# Patient Record
Sex: Female | Born: 1995 | Race: Black or African American | Hispanic: No | Marital: Single | Smoking: Former smoker
Health system: Southern US, Community
[De-identification: ages and names within clinical notes are randomized; demographics above are authoritative.]

## PROBLEM LIST (undated history)

## (undated) ENCOUNTER — Inpatient Hospital Stay (HOSPITAL_COMMUNITY): Payer: Self-pay

## (undated) DIAGNOSIS — B951 Streptococcus, group B, as the cause of diseases classified elsewhere: Secondary | ICD-10-CM

## (undated) DIAGNOSIS — T7840XA Allergy, unspecified, initial encounter: Secondary | ICD-10-CM

## (undated) DIAGNOSIS — O2340 Unspecified infection of urinary tract in pregnancy, unspecified trimester: Secondary | ICD-10-CM

## (undated) DIAGNOSIS — L309 Dermatitis, unspecified: Secondary | ICD-10-CM

## (undated) DIAGNOSIS — B009 Herpesviral infection, unspecified: Secondary | ICD-10-CM

## (undated) DIAGNOSIS — J45909 Unspecified asthma, uncomplicated: Secondary | ICD-10-CM

## (undated) DIAGNOSIS — N730 Acute parametritis and pelvic cellulitis: Secondary | ICD-10-CM

## (undated) DIAGNOSIS — O98319 Other infections with a predominantly sexual mode of transmission complicating pregnancy, unspecified trimester: Secondary | ICD-10-CM

## (undated) DIAGNOSIS — F4310 Post-traumatic stress disorder, unspecified: Secondary | ICD-10-CM

## (undated) DIAGNOSIS — A568 Sexually transmitted chlamydial infection of other sites: Secondary | ICD-10-CM

## (undated) DIAGNOSIS — N809 Endometriosis, unspecified: Secondary | ICD-10-CM

## (undated) HISTORY — DX: Herpesviral infection, unspecified: B00.9

## (undated) HISTORY — DX: Sexually transmitted chlamydial infection of other sites: A56.8

## (undated) HISTORY — PX: FOOT SURGERY: SHX648

## (undated) HISTORY — DX: Other infections with a predominantly sexual mode of transmission complicating pregnancy, unspecified trimester: O98.319

## (undated) HISTORY — DX: Unspecified asthma, uncomplicated: J45.909

---
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
## 2013-02-08 ENCOUNTER — Telehealth (HOSPITAL_COMMUNITY): Payer: Self-pay | Admitting: Emergency Medicine

## 2013-02-08 NOTE — ED Notes (Signed)
No response to letter sent after 30 days. Chart sent to Medical Records. °

## 2013-02-20 ENCOUNTER — Inpatient Hospital Stay (HOSPITAL_COMMUNITY): Payer: Medicaid Other

## 2013-02-20 ENCOUNTER — Inpatient Hospital Stay (HOSPITAL_COMMUNITY)
Admission: AD | Admit: 2013-02-20 | Discharge: 2013-02-20 | Disposition: A | Discharge status: Home / Self Care | Payer: Medicaid Other | Source: Ambulatory Visit | Attending: Obstetrics and Gynecology | Admitting: Obstetrics and Gynecology

## 2013-02-20 ENCOUNTER — Encounter (HOSPITAL_COMMUNITY): Payer: Self-pay | Admitting: *Deleted

## 2013-02-20 DIAGNOSIS — R109 Unspecified abdominal pain: Secondary | ICD-10-CM | POA: Insufficient documentation

## 2013-02-20 DIAGNOSIS — O4410 Placenta previa with hemorrhage, unspecified trimester: Secondary | ICD-10-CM | POA: Insufficient documentation

## 2013-02-20 DIAGNOSIS — O0932 Supervision of pregnancy with insufficient antenatal care, second trimester: Secondary | ICD-10-CM

## 2013-02-20 DIAGNOSIS — B9689 Other specified bacterial agents as the cause of diseases classified elsewhere: Secondary | ICD-10-CM | POA: Insufficient documentation

## 2013-02-20 DIAGNOSIS — N760 Acute vaginitis: Secondary | ICD-10-CM | POA: Insufficient documentation

## 2013-02-20 DIAGNOSIS — O209 Hemorrhage in early pregnancy, unspecified: Secondary | ICD-10-CM | POA: Insufficient documentation

## 2013-02-20 DIAGNOSIS — O2390 Unspecified genitourinary tract infection in pregnancy, unspecified trimester: Secondary | ICD-10-CM | POA: Insufficient documentation

## 2013-02-20 DIAGNOSIS — O26899 Other specified pregnancy related conditions, unspecified trimester: Secondary | ICD-10-CM

## 2013-02-20 DIAGNOSIS — A499 Bacterial infection, unspecified: Secondary | ICD-10-CM | POA: Insufficient documentation

## 2013-02-20 LAB — WET PREP, GENITAL
Trich, Wet Prep: NONE SEEN
Yeast Wet Prep HPF POC: NONE SEEN

## 2013-02-20 LAB — URINALYSIS, ROUTINE W REFLEX MICROSCOPIC
Glucose, UA: NEGATIVE mg/dL
Hgb urine dipstick: NEGATIVE
Leukocytes, UA: NEGATIVE
Specific Gravity, Urine: 1.02 (ref 1.005–1.030)
pH: 7 (ref 5.0–8.0)

## 2013-02-20 MED ORDER — METRONIDAZOLE 500 MG PO TABS: 500.0000 mg | ORAL_TABLET | Freq: Two times a day (BID) | ORAL | Status: DC

## 2013-02-20 NOTE — MAU Note (Signed)
Having pain in my left lower quadrant since 1000. Pain is like a sharp pain and comes and goes

## 2013-02-20 NOTE — MAU Note (Addendum)
PT SAYS  SHE AWOKE  AT 10 AM-  SHE NOTICED A PAIN ON L SIDE OF ABD - THEN THROUGH  DAY - BECAME WORSE-    THEN NAP- AWOKE AT 4 PM. - FELT LIKE POKING WITH NEEDLE.  STARTED MACROBID- FOR UTI-  WAS TOLD AT [facility] - FINISHED ON 2-13.   NO BLEEDING. HAD SPOTTING  TODAY- WHEN WIPING.   NEVER BEEN HERE  FOR THIS PREG. Marland Kitchen PLANS TO GO TO CLINIC DOWNSTAIRS FOR PNC- NO  APPOINTMENT.   LAST SEX-  WAS   01-12-2013.     WENT TO [facility] ON 12-19-12-  DID LABS , UA, .  THEN WENT TO [facility] ON 01-29-2013- DID U/S AND SPEC EXAM

## 2013-02-20 NOTE — MAU Provider Note (Signed)
Chief Complaint: Abdominal Pain   First Provider Initiated Contact with Patient 02/20/13 2012     SUBJECTIVE HPI: Ruth Daniels is a 17 y.o. G1P0 at [redacted]w[redacted]d by LMP who presents to maternity admissions reporting sharp intermittent pain on left side, increasing with pt movement.  Pt points to inguinal region of left side when asked about pain.  Pt reports vaginal bleeding described as pink spotting x2 weeks, which happened this morning but denies bleeding tonight.  She completed a course of Macrobid for UTI on 2/19. She denies LOF, bright red vaginal bleeding, vaginal itching/burning, urinary symptoms, h/a, dizziness, n/v, or fever/chills.     Past Medical History  Diagnosis Date  . Allergy     Penicillin  . Eczema    Past Surgical History  Procedure Laterality Date  . No past surgeries     History   Social History  . Marital Status: Single    Spouse Name: N/A    Number of Children: N/A  . Years of Education: N/A   Occupational History  . Student     12th grade- [employer]   Social History Main Topics  . Smoking status: Former Games developer  . Smokeless tobacco: Not on file  . Alcohol Use: No  . Drug Use: No  . Sexually Active: Yes    Birth Control/ Protection: Condom   Other Topics Concern  . Not on file   Social History Narrative  . No narrative on file   No current facility-administered medications on file prior to encounter.   No current outpatient prescriptions on file prior to encounter.   Allergies  Allergen Reactions  . Penicillins Other (See Comments)    Childhood reaction unknown    ROS: Pertinent items in HPI  OBJECTIVE Blood pressure 118/61, pulse 86, resp. rate 20, height 5\' 1"  (1.549 m), weight 56.972 kg (125 lb 9.6 oz), last menstrual period 11/05/2012, SpO2 99.00%. GENERAL: Well-developed, well-nourished female in no acute distress.  HEENT: Normocephalic HEART: normal rate RESP: normal effort ABDOMEN: Soft, non-tender EXTREMITIES:  Nontender, no edema NEURO: Alert and oriented Pelvic exam: Cervix pink, visually closed, significantly friable, moderate amount bright red blood pooling while cultures collected,  large amount yellow frothy discharge, vaginal walls also friable, external genitalia normal   LAB RESULTS Results for orders placed during the hospital encounter of 02/20/13 (from the past 24 hour(s))  URINALYSIS, ROUTINE W REFLEX MICROSCOPIC     Status: None   Collection Time    02/20/13  7:35 PM      Result Value Range   Color, Urine YELLOW  YELLOW   APPearance CLEAR  CLEAR   Specific Gravity, Urine 1.020  1.005 - 1.030   pH 7.0  5.0 - 8.0   Glucose, UA NEGATIVE  NEGATIVE mg/dL   Hgb urine dipstick NEGATIVE  NEGATIVE   Bilirubin Urine NEGATIVE  NEGATIVE   Ketones, ur NEGATIVE  NEGATIVE mg/dL   Protein, ur NEGATIVE  NEGATIVE mg/dL   Urobilinogen, UA 0.2  0.0 - 1.0 mg/dL   Nitrite NEGATIVE  NEGATIVE   Leukocytes, UA NEGATIVE  NEGATIVE   Blood type:  O POS  FHT 160  IMAGING US Ob Limited  02/20/2013  *RADIOLOGY REPORT*  Clinical Data: Second trimester pregnancy.  Vaginal bleeding.  LIMITED OBSTETRIC ULTRASOUND  Number of Fetuses: 1 Heart Rate: 165 bpm Movement: Present Presentation: Breech Placental Location: Anterior Previa: Inferior margin of the placenta partially covers the internal os. Amniotic Fluid (Subjective): Normal  Vertical Pocket 6.9 cm  BPD:  2.7 cm      14 w  6 d         EDC:  08/05/2013  MATERNAL FINDINGS: Cervix: Closed Uterus/Adnexae:  Otherwise unremarkable.  IMPRESSION: 1.  Single viable IUP with fetal heart rate of 165 beats per minute and an estimated gestational age of [redacted] weeks and 6 days. 2.  At this time there appears to be a marginal placenta previa. This may change as gestation progresses, but does warrant attention on follow-up studies.  Recommend followup with non-emergent complete OB 14+ wk US examination for fetal biometric evaluation and anatomic survey if not already performed.    Original Report Authenticated By: Trudie Reed, M.D.     ASSESSMENT 1. Vaginal bleeding before [redacted] weeks gestation   2. BV (bacterial vaginosis)   3. Late prenatal care complicating pregnancy, second trimester   4. Pain of round ligament complicating pregnancy, antepartum   5. Marginal placenta previa, second trimester    **Suspicious for trichomonas based on exam but wet prep negative, treatment for BV to cover both.  PLAN Discharge home with bleeding precautions Pelvic rest Message sent to [facility] for prenatal appointment Outpatient anatomy scan ordered in 3 weeks Flagyl 500 mg BID x7 days Return to MAU as needed   Sharen Counter Certified Nurse-Midwife 02/20/2013  8:44 PM

## 2013-02-21 NOTE — MAU Provider Note (Signed)
Attestation of Attending Supervision of Advanced Practitioner: Evaluation and management procedures were performed by the PA/NP/CNM/OB Fellow under my supervision/collaboration. Chart reviewed and agree with management and plan.  Sharnelle Cappelli V 02/21/2013 6:14 AM

## 2013-02-23 LAB — GC/CHLAMYDIA PROBE AMP: CT Probe RNA: POSITIVE — AB

## 2013-03-13 ENCOUNTER — Ambulatory Visit (HOSPITAL_COMMUNITY)
Admission: RE | Admit: 2013-03-13 | Discharge: 2013-03-13 | Disposition: A | Discharge status: Home / Self Care | Payer: Medicaid Other | Source: Ambulatory Visit | Attending: Advanced Practice Midwife | Admitting: Advanced Practice Midwife

## 2013-03-13 ENCOUNTER — Ambulatory Visit (HOSPITAL_COMMUNITY): Payer: Medicaid Other

## 2013-03-13 DIAGNOSIS — Z1389 Encounter for screening for other disorder: Secondary | ICD-10-CM | POA: Insufficient documentation

## 2013-03-13 DIAGNOSIS — O0932 Supervision of pregnancy with insufficient antenatal care, second trimester: Secondary | ICD-10-CM

## 2013-03-13 DIAGNOSIS — O358XX0 Maternal care for other (suspected) fetal abnormality and damage, not applicable or unspecified: Secondary | ICD-10-CM | POA: Insufficient documentation

## 2013-03-13 DIAGNOSIS — Z363 Encounter for antenatal screening for malformations: Secondary | ICD-10-CM | POA: Insufficient documentation

## 2013-03-13 DIAGNOSIS — O0930 Supervision of pregnancy with insufficient antenatal care, unspecified trimester: Secondary | ICD-10-CM | POA: Insufficient documentation

## 2013-03-19 ENCOUNTER — Other Ambulatory Visit: Payer: Self-pay | Admitting: Family

## 2013-03-19 ENCOUNTER — Encounter: Payer: Self-pay | Admitting: Family

## 2013-03-19 ENCOUNTER — Ambulatory Visit (INDEPENDENT_AMBULATORY_CARE_PROVIDER_SITE_OTHER): Payer: Medicaid Other | Admitting: Family

## 2013-03-19 ENCOUNTER — Ambulatory Visit (HOSPITAL_COMMUNITY): Payer: Medicaid Other

## 2013-03-19 VITALS — BP 115/69 | Wt 130.0 lb

## 2013-03-19 DIAGNOSIS — A749 Chlamydial infection, unspecified: Secondary | ICD-10-CM

## 2013-03-19 DIAGNOSIS — Z3402 Encounter for supervision of normal first pregnancy, second trimester: Secondary | ICD-10-CM

## 2013-03-19 DIAGNOSIS — Z3400 Encounter for supervision of normal first pregnancy, unspecified trimester: Secondary | ICD-10-CM

## 2013-03-19 DIAGNOSIS — O4690 Antepartum hemorrhage, unspecified, unspecified trimester: Secondary | ICD-10-CM

## 2013-03-19 LAB — POCT URINALYSIS DIP (DEVICE)
Ketones, ur: NEGATIVE mg/dL
Leukocytes, UA: NEGATIVE
Protein, ur: NEGATIVE mg/dL
Specific Gravity, Urine: >=1.03 (ref 1.005–1.030)
Urobilinogen, UA: 0.2 mg/dL (ref 0.0–1.0)
pH: 6 (ref 5.0–8.0)

## 2013-03-19 LAB — HIV ANTIBODY (ROUTINE TESTING W REFLEX): HIV: NONREACTIVE

## 2013-03-19 MED ORDER — AZITHROMYCIN 250 MG PO TABS
1000.0000 mg | ORAL_TABLET | Freq: Once | ORAL | Status: AC
  Administered 2013-03-19: 1000 mg via ORAL

## 2013-03-19 NOTE — Progress Notes (Signed)
Pulse: 78

## 2013-03-19 NOTE — Progress Notes (Signed)
Nutrition note: 1st visit consult Pt has gained 20# @ [redacted]w[redacted]d, which is > expected. Pt reports eating 3 meals & 3 snacks/d. Pt is taking PNV. Pt reports heartburn occ but no N&V. NKFA. Pt received verbal & written education on general nutrition during pregnancy & a handout on Mercury in Fish. Encouraged protein with all meals & snacks. Disc wt gain goals of 25-35# or 1#/wk. Pt agrees to cont taking PNV. Pt has WIC & plans to BF. F/u if referred Blondell Reveal, MS, RD, LDN

## 2013-03-19 NOTE — Progress Notes (Signed)
New OB visit; reviewed practice information and OB visit schedule; discussed genetic screening, desires quad. Obtain prenatal labs.  +Chlamydia in MAU, not treated, receive Zithromax 1 gm today; advised partner treatment and abstain from sex two weeks after partner treatment.  Pt has history of PTSD, declined discussing why, left often for her to disclose when ready.  No report of bleeding.  Rescheduled follow-up ultrasound

## 2013-03-20 LAB — OBSTETRIC PANEL
Basophils Relative: 0 % (ref 0–1)
Eosinophils Absolute: 0 10*3/uL (ref 0.0–0.7)
Eosinophils Relative: 0 % (ref 0–5)
Lymphs Abs: 2 10*3/uL (ref 0.7–4.0)
MCH: 31.1 pg (ref 26.0–34.0)
MCHC: 34.3 g/dL (ref 30.0–36.0)
MCV: 90.5 fL (ref 78.0–100.0)
Neutrophils Relative %: 72 % (ref 43–77)
Platelets: 235 10*3/uL (ref 150–400)
RBC: 3.67 MIL/uL — ABNORMAL LOW (ref 3.87–5.11)
RDW: 14.1 % (ref 11.5–15.5)

## 2013-03-23 LAB — HEMOGLOBINOPATHY EVALUATION
Hgb A2 Quant: 2.9 % (ref 2.2–3.2)
Hgb A: 97.1 % (ref 96.8–97.8)
Hgb S Quant: 0 %

## 2013-03-30 ENCOUNTER — Encounter: Payer: Self-pay | Admitting: *Deleted

## 2013-04-01 ENCOUNTER — Encounter: Payer: Self-pay | Admitting: Family

## 2013-04-03 ENCOUNTER — Ambulatory Visit (HOSPITAL_COMMUNITY)
Admission: RE | Admit: 2013-04-03 | Discharge: 2013-04-03 | Disposition: A | Discharge status: Home / Self Care | Payer: Medicaid Other | Source: Ambulatory Visit | Attending: Family | Admitting: Family

## 2013-04-03 DIAGNOSIS — Z3689 Encounter for other specified antenatal screening: Secondary | ICD-10-CM | POA: Insufficient documentation

## 2013-04-03 DIAGNOSIS — Z3402 Encounter for supervision of normal first pregnancy, second trimester: Secondary | ICD-10-CM

## 2013-04-04 ENCOUNTER — Encounter: Payer: Self-pay | Admitting: Family

## 2013-04-07 ENCOUNTER — Encounter: Payer: Self-pay | Admitting: *Deleted

## 2013-04-07 ENCOUNTER — Telehealth: Payer: Self-pay | Admitting: *Deleted

## 2013-04-07 NOTE — Telephone Encounter (Signed)
Pt left message requesting her Rx for PNV to be switched to [facility]. Please call back at [phone]. I returned the call and left message that pt may call her pharmacy and request them to transfer any remaining refills that she still has available.

## 2013-04-16 ENCOUNTER — Ambulatory Visit (INDEPENDENT_AMBULATORY_CARE_PROVIDER_SITE_OTHER): Payer: Medicaid Other | Admitting: Obstetrics and Gynecology

## 2013-04-16 ENCOUNTER — Encounter: Payer: Self-pay | Admitting: Obstetrics and Gynecology

## 2013-04-16 ENCOUNTER — Other Ambulatory Visit: Payer: Self-pay | Admitting: Obstetrics and Gynecology

## 2013-04-16 VITALS — BP 105/71 | Temp 96.1°F | Wt 136.7 lb

## 2013-04-16 DIAGNOSIS — Z3402 Encounter for supervision of normal first pregnancy, second trimester: Secondary | ICD-10-CM

## 2013-04-16 DIAGNOSIS — Z3400 Encounter for supervision of normal first pregnancy, unspecified trimester: Secondary | ICD-10-CM

## 2013-04-16 LAB — POCT URINALYSIS DIP (DEVICE)
Glucose, UA: NEGATIVE mg/dL
Nitrite: NEGATIVE

## 2013-04-16 NOTE — Progress Notes (Signed)
Patient doing well. Denies further episodes of vaginal bleeding. She reports one episode of vaginal spotting 1 week ago for which she had to wear one pad. Ultrasound reviewed

## 2013-04-16 NOTE — Progress Notes (Signed)
Pt reports some spotting about a week ago.

## 2013-05-13 ENCOUNTER — Encounter: Payer: Self-pay | Admitting: Advanced Practice Midwife

## 2013-06-03 ENCOUNTER — Encounter: Payer: Self-pay | Admitting: Family

## 2013-06-10 ENCOUNTER — Encounter: Payer: Self-pay | Admitting: Obstetrics and Gynecology

## 2013-07-01 ENCOUNTER — Encounter: Payer: Self-pay | Admitting: Family

## 2013-08-11 ENCOUNTER — Telehealth: Payer: Self-pay | Admitting: *Deleted

## 2013-08-11 NOTE — Telephone Encounter (Signed)
Pt left message stating that her due date was 8/6 and she wants to know how long she has to wait for induction of labor.  Pt was given clinic appt tomorrow @ [phone]. Note: She has cancelled or not shown to 4 appts and has not been seen in this clinic since April.

## 2013-08-12 ENCOUNTER — Encounter: Payer: Self-pay | Admitting: Advanced Practice Midwife

## 2013-08-12 ENCOUNTER — Ambulatory Visit (INDEPENDENT_AMBULATORY_CARE_PROVIDER_SITE_OTHER): Payer: Medicaid Other | Admitting: Family Medicine

## 2013-08-12 VITALS — BP 120/72 | Temp 97.6°F | Wt 162.6 lb

## 2013-08-12 DIAGNOSIS — O0933 Supervision of pregnancy with insufficient antenatal care, third trimester: Secondary | ICD-10-CM

## 2013-08-12 DIAGNOSIS — Z23 Encounter for immunization: Secondary | ICD-10-CM

## 2013-08-12 DIAGNOSIS — O0930 Supervision of pregnancy with insufficient antenatal care, unspecified trimester: Secondary | ICD-10-CM

## 2013-08-12 LAB — POCT URINALYSIS DIP (DEVICE)
Hgb urine dipstick: NEGATIVE
Protein, ur: NEGATIVE mg/dL
Specific Gravity, Urine: >=1.03 (ref 1.005–1.030)
Urobilinogen, UA: 2 mg/dL — ABNORMAL HIGH (ref 0.0–1.0)

## 2013-08-12 LAB — CBC
MCH: 29.9 pg (ref 26.0–34.0)
MCV: 89.2 fL (ref 78.0–100.0)
Platelets: 178 10*3/uL (ref 150–400)
RDW: 12.8 % (ref 11.5–15.5)
WBC: 6.4 10*3/uL (ref 4.0–10.5)

## 2013-08-12 MED ORDER — TETANUS-DIPHTH-ACELL PERTUSSIS 5-2.5-18.5 LF-MCG/0.5 IM SUSP
0.5000 mL | Freq: Once | INTRAMUSCULAR | Status: AC
  Administered 2013-08-12: 0.5 mL via INTRAMUSCULAR

## 2013-08-12 NOTE — Addendum Note (Signed)
Addended by: Franchot Mimes on: 08/12/2013 03:01 PM   Modules accepted: Orders

## 2013-08-12 NOTE — Progress Notes (Addendum)
Informal [place] for presentation - vertex.

## 2013-08-12 NOTE — Addendum Note (Signed)
Addended by: Sherre Lain A on: 08/12/2013 04:58 PM   Modules accepted: Orders

## 2013-08-12 NOTE — Patient Instructions (Addendum)

## 2013-08-12 NOTE — Progress Notes (Signed)
Ruth Daniels is a 17 y.o. G1P0 who presents today for her estimated forty week prenatal visit.  She does no know her last menstrual period, but thinks it was sometime in November.  She has a history of irregular periods. She has no complaints today. She denies any blood from the vagina.  She reports some white discharge, and states its volume and frequency is normal. She reports the baby moving often during rest and ADLs.  She denies changes in the frequency of the baby's movement.  She reports some constipation, and has a bowel movement roughly once every three days.  She denies difficulty urinating, eating, or drinking.

## 2013-08-12 NOTE — Progress Notes (Signed)
     Subjective:    Ruth Daniels is a 17 y.o. female being seen today for her obstetrical visit. She is at [redacted]w[redacted]d gestation. Patient reports no bleeding, no contractions, no cramping and no leaking. Fetal movement: normal.  Menstrual History: OB History   Grav Para Term Preterm Abortions TAB SAB Ect Mult Living   1                Patient's last menstrual period was 11/05/2012.    The following portions of the patient's history were reviewed and updated as appropriate: allergies, current medications, past family history, past medical history, past social history, past surgical history and problem list.  Review of Systems Pertinent items are noted in HPI.   Objective:    BP 120/72  Temp(Src) 97.6 F (36.4 C)  Wt 162 lb 9.6 oz (73.755 kg)  BMI 30.74 kg/m2  LMP 11/05/2012 FHT:  133 BPM  Uterine Size: 37 cm  Presentations: unsure  Pelvic Exam:              Dilation: 1cm       Effacement: 25%   Station:  -3     Consistency: medium            Position: posterior     Assessment:  Ruth Daniels is a 17 y.o. G1P0 at [redacted]w[redacted]d by L=16 presents for follow up but has not been seen since April.   Plan:     Discussed with Patient:  - Plans to breast/bottle feed.  All questions answered. - Reviewed fetal kick counts Pt to perform daily at a time when the baby is active, lie laterally with both hands on belly in quiet room and count all movements (hiccups, shoulder rolls, obvious kicks, etc); pt is to report to clinic MAU for less than 10 movements felt in a 2 hour time period-pt told as soon as she counts 10 movements the count is complete.  - Routine precautions discussed (depression, infection s/s).   Patient provided with all pertinent phone numbers for emergencies. - RTC for any VB, regular, painful cramps/ctxs occurring at a rate of >2/10 min, fever (100.5 or higher), n/v/d, any pain that is unresolving or worsening, LOF, decreased fetal movement, CP, SOB, edema - schedule  induction at 41 wks 20 @ 0730  Problems: Patient Active Problem List   Diagnosis Date Noted  . Supervision of normal first pregnancy 03/19/2013  . PTSD (post-traumatic stress disorder) 02/19/2012    To Do: 1. GBS f/u 2. F/u glucola 3. Tdap  [x ] Vaccines: Flu:  Tdap: needs today [x ] BCM: mirena [x ] Readiness: baby has a place to sleep, car seat, other baby necessities.  Edu: [x ] TL precautions; [ ]  BF class; [ ]  childbirth class; [ ]   BF counseling;

## 2013-08-12 NOTE — Progress Notes (Signed)
Induction scheduled for 08/19/13 at 0730
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
## 2013-08-13 LAB — DRUG SCREEN, URINE
Amphetamine Screen, Ur: NEGATIVE
Barbiturate Quant, Ur: NEGATIVE
Cocaine Metabolites: NEGATIVE
Marijuana Metabolite: NEGATIVE
Opiates: NEGATIVE

## 2013-08-13 LAB — RPR

## 2013-08-14 ENCOUNTER — Telehealth (HOSPITAL_COMMUNITY): Payer: Self-pay | Admitting: *Deleted

## 2013-08-14 NOTE — Telephone Encounter (Signed)
Preadmission screen  

## 2013-08-17 ENCOUNTER — Encounter: Payer: Self-pay | Admitting: Family Medicine

## 2013-08-17 ENCOUNTER — Ambulatory Visit (INDEPENDENT_AMBULATORY_CARE_PROVIDER_SITE_OTHER): Payer: Medicaid Other | Admitting: *Deleted

## 2013-08-17 VITALS — BP 121/73

## 2013-08-17 DIAGNOSIS — O480 Post-term pregnancy: Secondary | ICD-10-CM

## 2013-08-17 NOTE — Progress Notes (Signed)
P=88 

## 2013-08-19 ENCOUNTER — Encounter (HOSPITAL_COMMUNITY): Payer: Self-pay

## 2013-08-19 ENCOUNTER — Inpatient Hospital Stay (HOSPITAL_COMMUNITY)
Admission: RE | Admit: 2013-08-19 | Discharge: 2013-08-21 | DRG: 774 | Disposition: A | Discharge status: Home / Self Care | Payer: Medicaid Other | Source: Ambulatory Visit | Attending: Obstetrics & Gynecology | Admitting: Obstetrics & Gynecology

## 2013-08-19 DIAGNOSIS — O411090 Infection of amniotic sac and membranes, unspecified, unspecified trimester, not applicable or unspecified: Secondary | ICD-10-CM | POA: Diagnosis present

## 2013-08-19 DIAGNOSIS — N739 Female pelvic inflammatory disease, unspecified: Secondary | ICD-10-CM | POA: Diagnosis present

## 2013-08-19 DIAGNOSIS — O480 Post-term pregnancy: Principal | ICD-10-CM | POA: Diagnosis present

## 2013-08-19 DIAGNOSIS — O98319 Other infections with a predominantly sexual mode of transmission complicating pregnancy, unspecified trimester: Secondary | ICD-10-CM | POA: Diagnosis present

## 2013-08-19 DIAGNOSIS — A5619 Other chlamydial genitourinary infection: Secondary | ICD-10-CM | POA: Diagnosis present

## 2013-08-19 LAB — CBC
HCT: 31.1 % — ABNORMAL LOW (ref 36.0–46.0)
MCHC: 34.1 g/dL (ref 30.0–36.0)
RDW: 13.1 % (ref 11.5–15.5)

## 2013-08-19 MED ORDER — LACTATED RINGERS IV SOLN: INTRAVENOUS | Status: DC

## 2013-08-19 MED ORDER — SENNOSIDES-DOCUSATE SODIUM 8.6-50 MG PO TABS: 2.0000 | ORAL_TABLET | Freq: Every day | ORAL | Status: DC

## 2013-08-19 MED ORDER — ACETAMINOPHEN 325 MG PO TABS: 650.0000 mg | ORAL_TABLET | ORAL | Status: DC | PRN

## 2013-08-19 MED ORDER — DIPHENHYDRAMINE HCL 25 MG PO CAPS: 25.0000 mg | ORAL_CAPSULE | Freq: Four times a day (QID) | ORAL | Status: DC | PRN

## 2013-08-19 MED ORDER — MISOPROSTOL 25 MCG QUARTER TABLET
25.0000 ug | ORAL_TABLET | ORAL | Status: DC | PRN
  Administered 2013-08-19: 25 ug via VAGINAL
  Filled 2013-08-19: qty 0.25
  Filled 2013-08-19: qty 1

## 2013-08-19 MED ORDER — WITCH HAZEL-GLYCERIN EX PADS: 1.0000 "application " | MEDICATED_PAD | CUTANEOUS | Status: DC | PRN

## 2013-08-19 MED ORDER — PNEUMOCOCCAL VAC POLYVALENT 25 MCG/0.5ML IJ INJ
0.5000 mL | INJECTION | INTRAMUSCULAR | Status: AC
  Administered 2013-08-20: 0.5 mL via INTRAMUSCULAR
  Filled 2013-08-19: qty 0.5

## 2013-08-19 MED ORDER — OXYCODONE-ACETAMINOPHEN 5-325 MG PO TABS: 1.0000 | ORAL_TABLET | ORAL | Status: DC | PRN

## 2013-08-19 MED ORDER — ONDANSETRON HCL 4 MG/2ML IJ SOLN: 4.0000 mg | INTRAMUSCULAR | Status: DC | PRN

## 2013-08-19 MED ORDER — DIBUCAINE 1 % RE OINT: 1.0000 "application " | TOPICAL_OINTMENT | RECTAL | Status: DC | PRN

## 2013-08-19 MED ORDER — CLINDAMYCIN PHOSPHATE 900 MG/50ML IV SOLN: 900.0000 mg | Freq: Three times a day (TID) | INTRAVENOUS | Status: DC

## 2013-08-19 MED ORDER — PHENYLEPHRINE 40 MCG/ML (10ML) SYRINGE FOR IV PUSH (FOR BLOOD PRESSURE SUPPORT)
80.0000 ug | PREFILLED_SYRINGE | INTRAVENOUS | Status: DC | PRN
  Filled 2013-08-19: qty 2

## 2013-08-19 MED ORDER — IBUPROFEN 600 MG PO TABS
600.0000 mg | ORAL_TABLET | Freq: Four times a day (QID) | ORAL | Status: DC | PRN
  Administered 2013-08-19: 600 mg via ORAL
  Filled 2013-08-19: qty 1

## 2013-08-19 MED ORDER — ZOLPIDEM TARTRATE 5 MG PO TABS: 5.0000 mg | ORAL_TABLET | Freq: Every evening | ORAL | Status: DC | PRN

## 2013-08-19 MED ORDER — OXYTOCIN BOLUS FROM INFUSION
500.0000 mL | INTRAVENOUS | Status: DC
  Administered 2013-08-19: 500 mL via INTRAVENOUS

## 2013-08-19 MED ORDER — LACTATED RINGERS IV SOLN: 500.0000 mL | INTRAVENOUS | Status: DC | PRN

## 2013-08-19 MED ORDER — CITRIC ACID-SODIUM CITRATE 334-500 MG/5ML PO SOLN: 30.0000 mL | ORAL | Status: DC | PRN

## 2013-08-19 MED ORDER — EPHEDRINE 5 MG/ML INJ
10.0000 mg | INTRAVENOUS | Status: DC | PRN
  Filled 2013-08-19: qty 2

## 2013-08-19 MED ORDER — OXYTOCIN 40 UNITS IN LACTATED RINGERS INFUSION - SIMPLE MED
62.5000 mL/h | INTRAVENOUS | Status: DC
  Filled 2013-08-19: qty 1000

## 2013-08-19 MED ORDER — ONDANSETRON HCL 4 MG/2ML IJ SOLN
4.0000 mg | Freq: Four times a day (QID) | INTRAMUSCULAR | Status: DC | PRN
  Administered 2013-08-19: 4 mg via INTRAVENOUS
  Filled 2013-08-19: qty 2

## 2013-08-19 MED ORDER — GENTAMICIN SULFATE 40 MG/ML IJ SOLN: 5.0000 mg/kg | INTRAVENOUS | Status: DC

## 2013-08-19 MED ORDER — GENTAMICIN SULFATE 40 MG/ML IJ SOLN
Freq: Three times a day (TID) | INTRAVENOUS | Status: DC
  Administered 2013-08-19: 17:00:00 via INTRAVENOUS
  Filled 2013-08-19 (×2): qty 3.75

## 2013-08-19 MED ORDER — DIPHENHYDRAMINE HCL 50 MG/ML IJ SOLN: 12.5000 mg | INTRAMUSCULAR | Status: DC | PRN

## 2013-08-19 MED ORDER — BENZOCAINE-MENTHOL 20-0.5 % EX AERO
1.0000 "application " | INHALATION_SPRAY | CUTANEOUS | Status: DC | PRN
  Administered 2013-08-21: 1 via TOPICAL
  Filled 2013-08-19: qty 56

## 2013-08-19 MED ORDER — AZITHROMYCIN 500 MG PO TABS
1000.0000 mg | ORAL_TABLET | Freq: Once | ORAL | Status: AC
  Administered 2013-08-19: 1000 mg via ORAL
  Filled 2013-08-19: qty 2

## 2013-08-19 MED ORDER — IBUPROFEN 600 MG PO TABS
600.0000 mg | ORAL_TABLET | Freq: Four times a day (QID) | ORAL | Status: DC
  Administered 2013-08-20 – 2013-08-21 (×4): 600 mg via ORAL
  Filled 2013-08-19 (×4): qty 1

## 2013-08-19 MED ORDER — DEXTROSE 5 % IV SOLN
Freq: Three times a day (TID) | INTRAVENOUS | Status: AC
  Administered 2013-08-20: 01:00:00 via INTRAVENOUS
  Filled 2013-08-19: qty 3.75

## 2013-08-19 MED ORDER — LANOLIN HYDROUS EX OINT: TOPICAL_OINTMENT | CUTANEOUS | Status: DC | PRN

## 2013-08-19 MED ORDER — SIMETHICONE 80 MG PO CHEW: 80.0000 mg | CHEWABLE_TABLET | ORAL | Status: DC | PRN

## 2013-08-19 MED ORDER — LIDOCAINE HCL (PF) 1 % IJ SOLN
30.0000 mL | INTRAMUSCULAR | Status: DC | PRN
  Administered 2013-08-19: 30 mL via SUBCUTANEOUS
  Filled 2013-08-19 (×2): qty 30

## 2013-08-19 MED ORDER — TERBUTALINE SULFATE 1 MG/ML IJ SOLN: 0.2500 mg | Freq: Once | INTRAMUSCULAR | Status: DC | PRN

## 2013-08-19 MED ORDER — VANCOMYCIN HCL IN DEXTROSE 1-5 GM/200ML-% IV SOLN: 1000.0000 mg | Freq: Two times a day (BID) | INTRAVENOUS | Status: DC

## 2013-08-19 MED ORDER — TETANUS-DIPHTH-ACELL PERTUSSIS 5-2.5-18.5 LF-MCG/0.5 IM SUSP: 0.5000 mL | Freq: Once | INTRAMUSCULAR | Status: DC

## 2013-08-19 MED ORDER — LACTATED RINGERS IV SOLN: 500.0000 mL | Freq: Once | INTRAVENOUS | Status: DC

## 2013-08-19 MED ORDER — FENTANYL CITRATE 0.05 MG/ML IJ SOLN
100.0000 ug | INTRAMUSCULAR | Status: DC | PRN
  Administered 2013-08-19 (×2): 100 ug via INTRAVENOUS
  Filled 2013-08-19 (×3): qty 2

## 2013-08-19 MED ORDER — ONDANSETRON HCL 4 MG PO TABS: 4.0000 mg | ORAL_TABLET | ORAL | Status: DC | PRN

## 2013-08-19 MED ORDER — PRENATAL MULTIVITAMIN CH
1.0000 | ORAL_TABLET | Freq: Every day | ORAL | Status: DC
  Administered 2013-08-20: 1 via ORAL
  Filled 2013-08-19: qty 1

## 2013-08-19 MED ORDER — FENTANYL CITRATE 0.05 MG/ML IJ SOLN
50.0000 ug | INTRAMUSCULAR | Status: DC | PRN
  Administered 2013-08-19: 50 ug via INTRAVENOUS

## 2013-08-19 MED ORDER — FENTANYL 2.5 MCG/ML BUPIVACAINE 1/10 % EPIDURAL INFUSION (WH - ANES): 14.0000 mL/h | INTRAMUSCULAR | Status: DC | PRN

## 2013-08-19 MED ORDER — FLEET ENEMA 7-19 GM/118ML RE ENEM: 1.0000 | ENEMA | Freq: Every day | RECTAL | Status: DC | PRN

## 2013-08-19 NOTE — Progress Notes (Signed)
Ruth Daniels is a 17 y.o. G1P0 at [redacted]w[redacted]d admitted for induction of labor due to Post dates. Due date 08/12/13.  Subjective: Having regular contractions. FM+   Objective: BP 137/95  Pulse 109  Temp(Src) 101.3 F (38.5 C) (Axillary)  Resp 20  Ht 5\' 1"  (1.549 m)  Wt 75.297 kg (166 lb)  BMI 31.38 kg/m2  LMP 11/05/2012      FHT:  FHR: 145 bpm, variability: moderate,  accelerations:  Present,  decelerations:  Absent UC:   regular, every 2-3 minutes SVE:   Dilation: 9 Effacement (%): 100 Station: -1 Exam by:: Dherr rn  Labs: Lab Results  Component Value Date   WBC 7.3 08/19/2013   HGB 10.6* 08/19/2013   HCT 31.1* 08/19/2013   MCV 87.1 08/19/2013   PLT 190 08/19/2013    Assessment / Plan: Spontaneous labor, progressing normally, s/p AROM with brown fluid. Currently does not have a fever.  Labor: Progressing normally Preeclampsia:  N/A Fetal Wellbeing:  Category I Pain Control:  Fentanyl I/D:  Chlaymdia, Chorioamnionitis (Tm:101.3); Started gentamycin and clindamycin per pharmacy for chorio tx Anticipated MOD:  NSVD  Jacquelin Hawking, MD 08/19/2013, 6:54 PM

## 2013-08-19 NOTE — Progress Notes (Signed)
ANTIBIOTIC CONSULT NOTE - INITIAL  Pharmacy Consult for Gentamicin Indication: Maternal temp; R/O chorioamnionitis  Allergies  Allergen Reactions  . Penicillins Other (See Comments)    Childhood reaction unknown  . Latex Rash    Patient Measurements: Height: 5\' 1"  (154.9 cm) Weight: 166 lb (75.297 kg) IBW/kg (Calculated) : 47.8 Adjusted Body Weight: 56kg  Vital Signs: Temp: 101.3 F (38.5 C) (08/20 1529) Temp src: Axillary (08/20 1529) BP: 116/76 mmHg (08/20 0825) Pulse Rate: 124 (08/20 0825)  Labs:  Recent Labs  08/19/13 0810  WBC 7.3  HGB 10.6*  PLT 190   Estimated Creatinine Clearance: 105 ml/min (by C-G formula based on Cr of 0.67). Microbiology:   Medical History: Past Medical History  Diagnosis Date  . Allergy     Penicillin  . Eczema   . Chlamydia trachomatis infection in pregnancy     Medications:  Clindamycin 900mg  IV q8h Assessment: 17yo F admitted for IOL due to postdate. Gentamicin and Clindamycin started for maternal temp during labor.  Goal of Therapy:  Gentamicin peaks 6-37mcg/ml  And troughs < 81mcg/ml  Plan:  1. Gentamicin 150 mg IV q8h. 2. Will continue to follow and assess need for further Gentamicin kinetics if continued postpartum > 72 hours.  Claybon Jabs 08/19/2013,4:41 PM

## 2013-08-19 NOTE — H&P (Cosign Needed Addendum)
Ruth Daniels is a 17 y.o. female presenting for IOL for postdates. She is having irregular contractions. Good fetal movement. No LOF or vaginal bleeding. History OB History   Grav Para Term Preterm Abortions TAB SAB Ect Mult Living   1              Past Medical History  Diagnosis Date  . Allergy     Penicillin  . Eczema   . Chlamydia trachomatis infection in pregnancy    Past Surgical History  Procedure Laterality Date  . No past surgeries    . Foot surgery     Family History: family history includes Depression in her mother. Social History:  reports that she has quit smoking. She does not have any smokeless tobacco history on file. She reports that she does not drink alcohol or use illicit drugs.   Prenatal Transfer Tool  Maternal Diabetes: No Genetic Screening: Normal Maternal Ultrasounds/Referrals: Normal Fetal Ultrasounds or other Referrals:  None Maternal Substance Abuse:  No Significant Maternal Medications:  None Significant Maternal Lab Results:  None Other Comments:  None Chlamydia + on screening last week - ABX ordered today.  ROS  Dilation: 1.5 Effacement (%): 70 Station: -2 Exam by:: Dherr rn Blood pressure 116/76, pulse 124, temperature 98.6 F (37 C), temperature source Oral, resp. rate 20, height 5\' 1"  (1.549 m), weight 75.297 kg (166 lb), last menstrual period 11/05/2012. Exam Physical Exam  Prenatal labs: ABO, Rh: O/POS/-- (03/20 1143) Antibody: NEG (03/20 1143) Rubella: 1.28 (03/20 1143) RPR: NON REAC (08/13 1516)  HBsAg: NEGATIVE (03/20 1143)  HIV: NON REACTIVE (08/13 1516)  GBS:  Negative 1-hour glucose: 141  Assessment/Plan: 17 yo G1 [redacted]w[redacted]d admitted for IOL due to pastdates. Cervix is currently unfavorable (Bishop score: 4). Contractions are irregular every 6-7 minutes, and she is 1.5cm/70%/-2 - start Cytotec x1 and recheck cervix in 3-4 hours - routine labor orders - admitting attending physician Dr. Britta Mccreedy,  MD 08/19/2013, 9:40 AM  I spoke with and examined patient and agree with resident's note and plan of care.   #Labor: IOL start with cytotec #Pain: Desires natural. #FWB:Cat I #ID: .GBS Neg - CT positive on screen last week. No treatment. Given Azithromycin 1g x1 in house. #MOF: Breast #MOC: Minipill/Mirena #Circ: outpatient Tawana Scale, MD OB Fellow

## 2013-08-19 NOTE — Progress Notes (Signed)
Ruth Daniels is a 17 y.o. G1P0 at [redacted]w[redacted]d admitted for induction of labor due to Post dates. Due date 08/12/13.  Subjective: Currently in pain, having regular contractions. +FM.   Objective: BP 116/76  Pulse 124  Temp(Src) 101.3 F (38.5 C) (Axillary)  Resp 20  Ht 5\' 1"  (1.549 m)  Wt 75.297 kg (166 lb)  BMI 31.38 kg/m2  LMP 11/05/2012      FHT:  FHR: 160 bpm, variability: moderate,  accelerations:  Present,  decelerations:  Absent UC:   regular, every 1-2 minutes SVE:   Dilation: 4 Effacement (%): 80 Station: -1 Exam by:: Dherr rn  Labs: Lab Results  Component Value Date   WBC 7.3 08/19/2013   HGB 10.6* 08/19/2013   HCT 31.1* 08/19/2013   MCV 87.1 08/19/2013   PLT 190 08/19/2013    Assessment / Plan: Induction of labor due to pastdates, progressing well after one dose of cytotec  Labor: Progressing normally Preeclampsia:  N/A Fetal Wellbeing:  Category I Pain Control:  Fentanyl I/D:  Chlaymdia, Chorioamnionitis (Tm:101.3); Started gentamycin and clindamycin per pharmacy for chorio tx Anticipated MOD:  NSVD  Jacquelin Hawking 08/19/2013, 3:51 PM

## 2013-08-19 NOTE — Progress Notes (Signed)
8/18 NST reviewed and category I

## 2013-08-20 NOTE — Progress Notes (Signed)
UR chart review completed.  

## 2013-08-20 NOTE — Progress Notes (Signed)
Post Partum Day 1 Subjective: up ad lib, no complaints. Resting well. Baby doing well.  Objective: Blood pressure 122/71, pulse 105, temperature 98.7 F (37.1 C), temperature source Oral, resp. rate 18, height 5\' 1"  (1.549 m), weight 75.297 kg (166 lb), last menstrual period 11/05/2012, unknown if currently breastfeeding.  Physical Exam:  General: alert, cooperative and no distress Lochia: appropriate Uterine Fundus: firm Incision: N/A DVT Evaluation: No evidence of DVT seen on physical exam.   Recent Labs  08/19/13 0810  HGB 10.6*  HCT 31.1*    Assessment/Plan: Plan for discharge tomorrow, Breastfeeding and Contraception Mirena   LOS: 1 day   Jacquelin Hawking, MD 08/20/2013, 7:33 AM

## 2013-08-21 MED ORDER — MEDROXYPROGESTERONE ACETATE 150 MG/ML IM SUSP
150.0000 mg | Freq: Once | INTRAMUSCULAR | Status: AC
  Administered 2013-08-21: 150 mg via INTRAMUSCULAR
  Filled 2013-08-21: qty 1

## 2013-08-21 NOTE — Progress Notes (Signed)
I spoke with and examined patient and agree with resident's note and plan of care.  Shaima Sardinas Ryan Audreana Hancox, MD OB Fellow 08/21/2013 10:00 AM   

## 2013-08-21 NOTE — Discharge Summary (Addendum)
Obstetric Discharge Summary Reason for Admission: induction of labor and for postdates Prenatal Procedures: ultrasound Intrapartum Procedures: spontaneous vaginal delivery and chorio treatment and chlamydia treatment Postpartum Procedures: none and antibiotics Complications-Operative and Postpartum: bilateral labial laceration Hemoglobin  Date Value Range Status  08/19/2013 10.6* 12.0 - 15.0 g/dL Final     HCT  Date Value Range Status  08/19/2013 31.1* 36.0 - 46.0 % Final    Physical Exam:  General: alert, cooperative and no distress Lochia: appropriate Uterine Fundus: firm Incision: NA DVT Evaluation: No evidence of DVT seen on physical exam. Negative Homan's sign. No cords or calf tenderness. No significant calf/ankle edema.  Discharge Diagnoses: Term Pregnancy-delivered, Amnionitis, Post-date pregnancy and chlamydia infection  Discharge Information: Date: 08/21/2013 Activity: pelvic rest Diet: routine Medications: PNV and Ibuprofen Condition: stable Instructions: refer to practice specific booklet Discharge to: home   Newborn Data: Live born female  Birth Weight: 6 lb 14 oz (3118 g) APGAR: 8, 9  Home with FOB.  Breastfeeding.  Would like information on potentially obtaining a free breast pump that her nurse had mentioned.  Will order lactation consult.  Mirena for birth control.  Getting circumcision outside hospital.  Garnette Czech 08/21/2013, 8:02 AM  Will give depo before leaving 2/2 loss to care for majority of pregnancy and concern pt will not follow up for Mirena.   I spoke with and examined patient and agree with PA-S's note and plan of care.  Tawana Scale, MD Ob Fellow 08/21/2013 10:01 AM
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
## 2013-09-09 ENCOUNTER — Ambulatory Visit: Payer: Self-pay | Admitting: Family Medicine

## 2013-09-14 IMAGING — US US OB FOLLOW-UP
1 series · 12 of 28 positions shown · non-contrast
Comparison: none

[Series 1: us ob follow up · 12 of 49 slices shown]
[im 2/49]
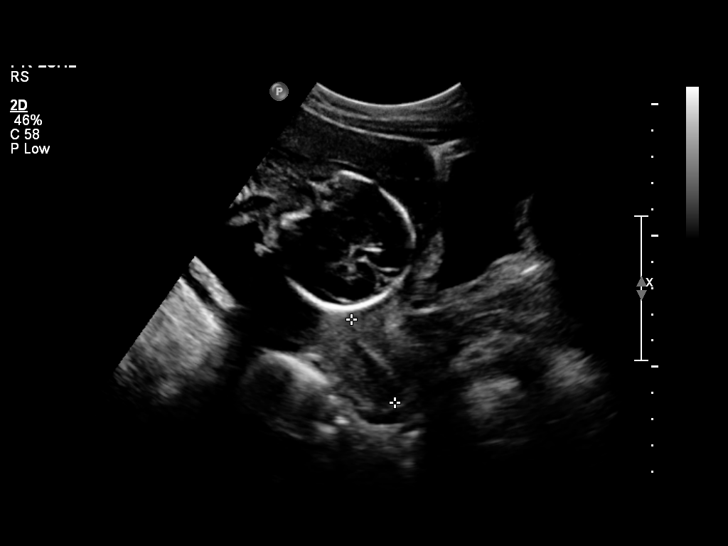
[im 6/49]
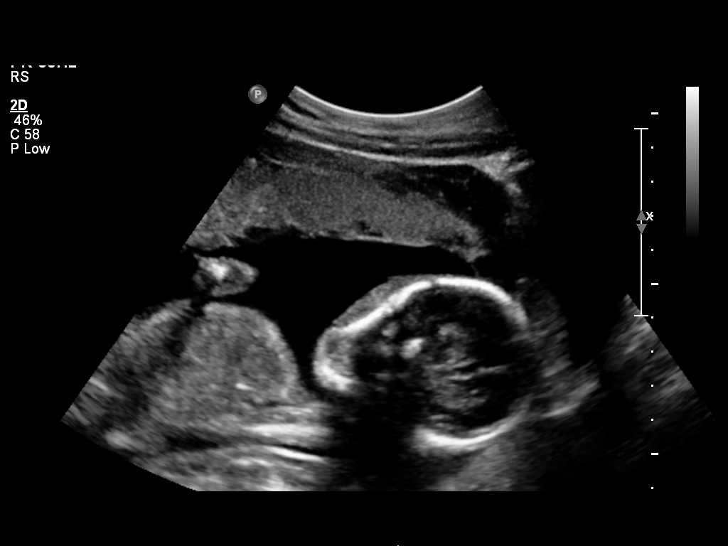
[im 9/49]
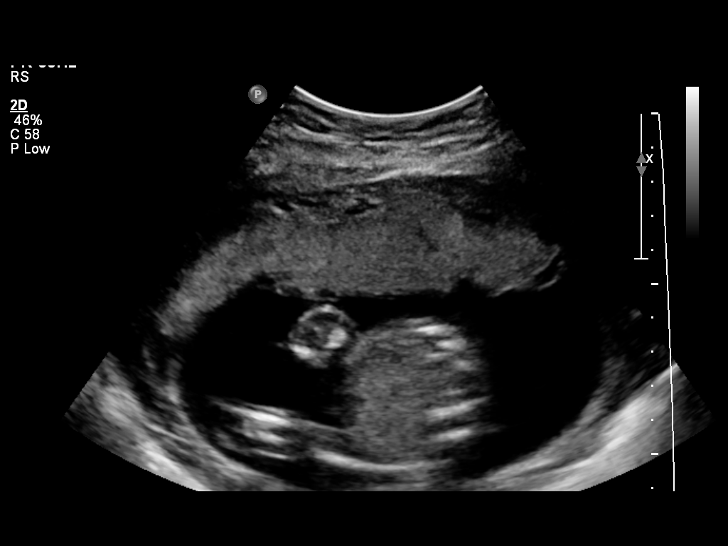
[im 15/49]
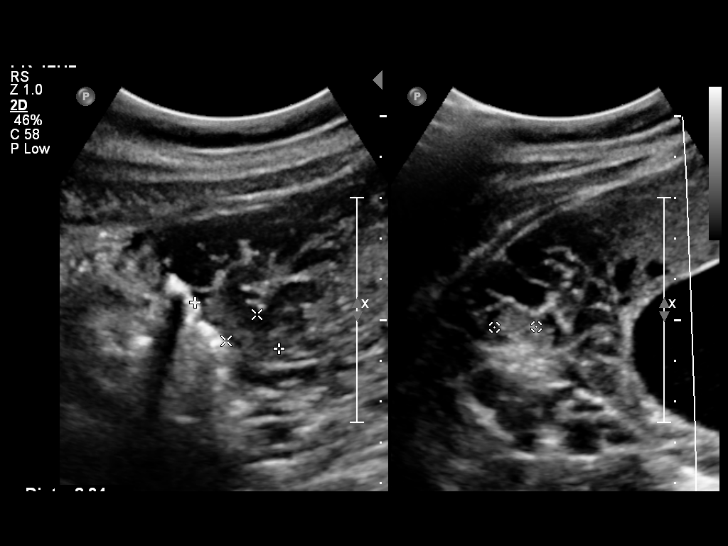
[im 18/49]
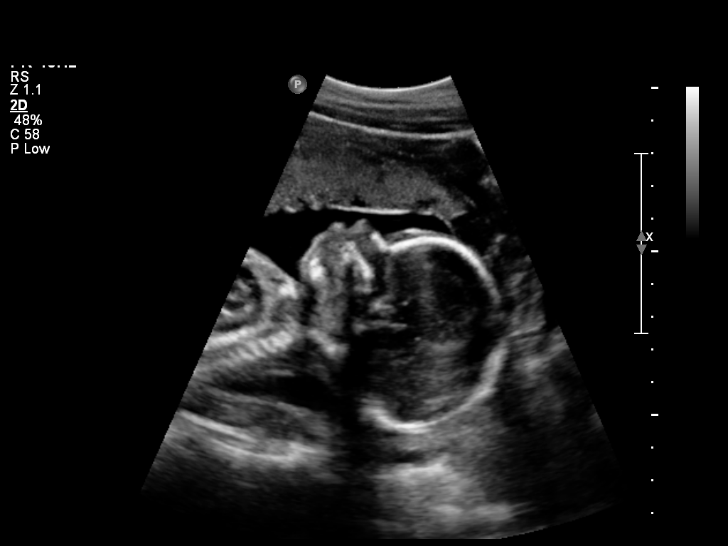
[im 22/49]
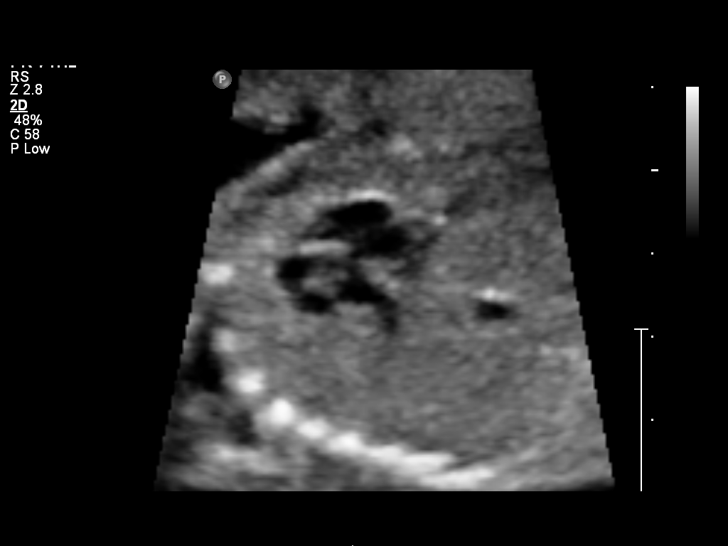
[im 27/49]
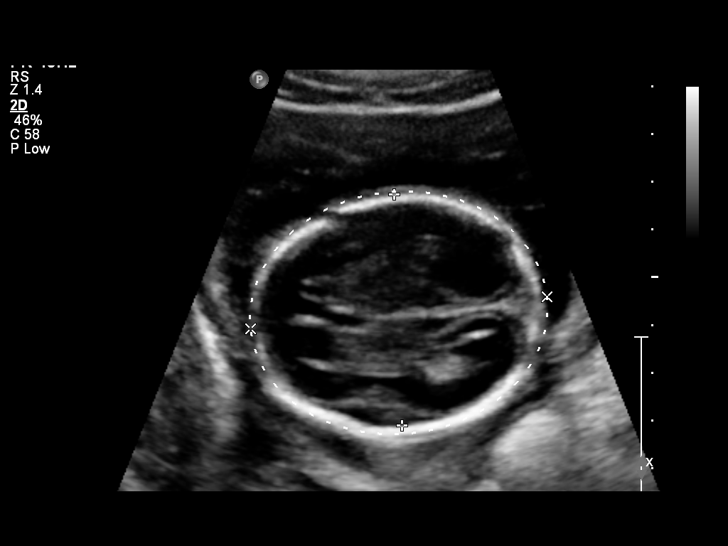
[im 31/49]
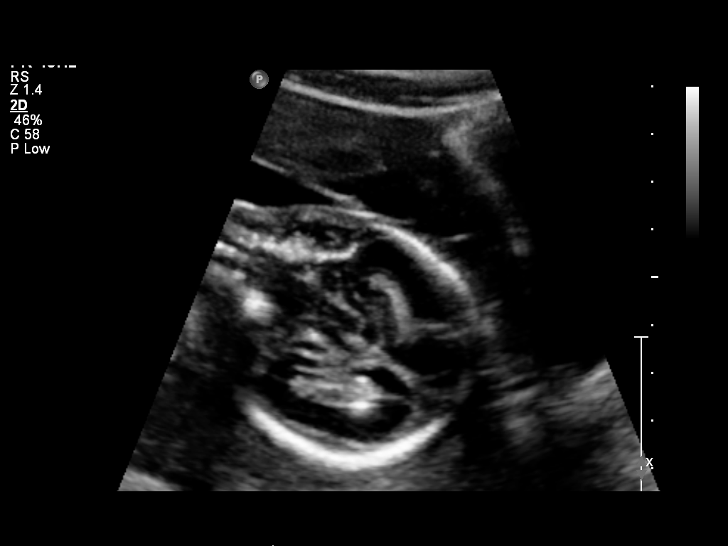
[im 34/49]
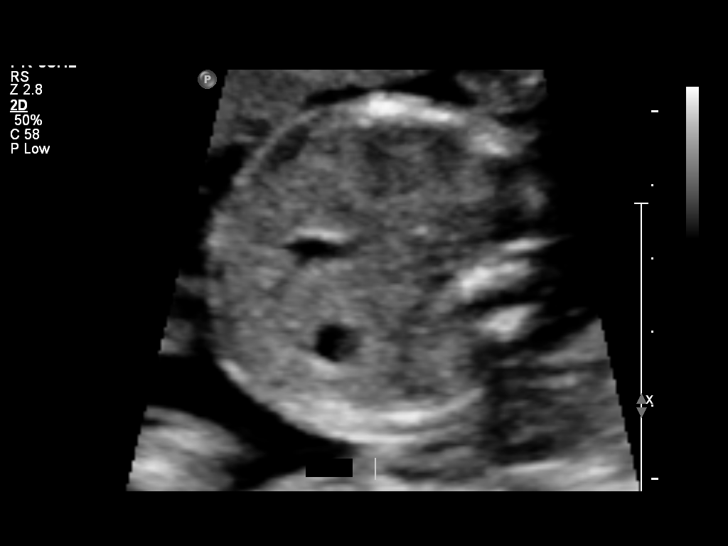
[im 40/49]
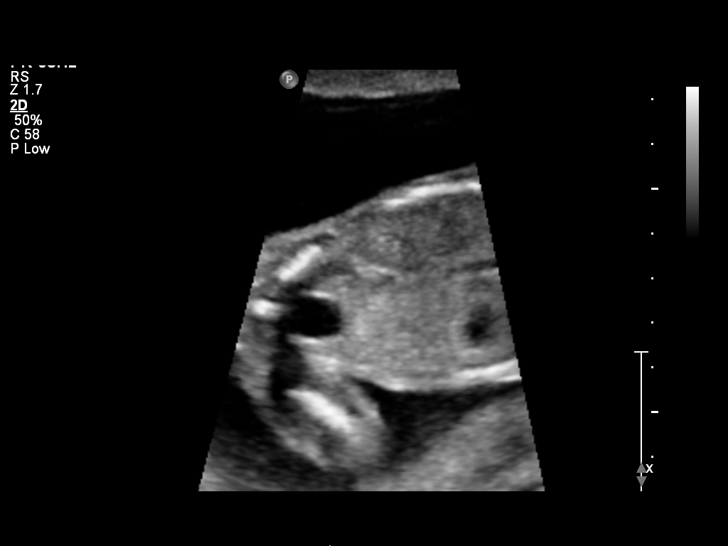
[im 43/49]
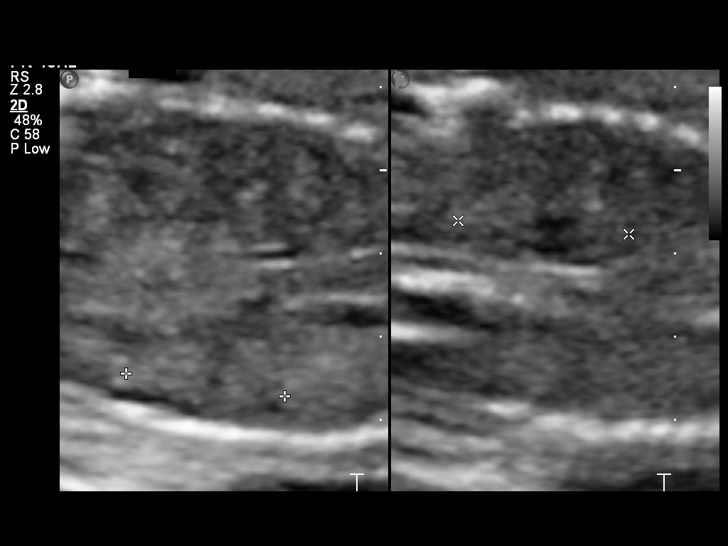
[im 47/49]
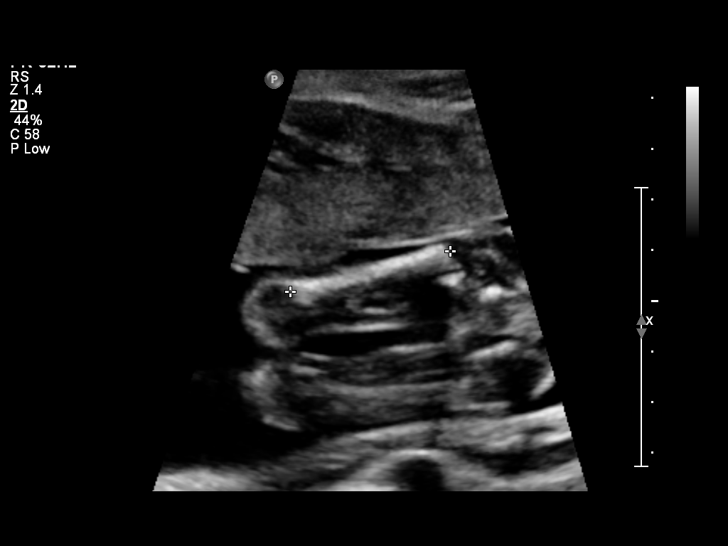

[12 of 28 positions shown; findings below may reference images not displayed]

OBSTETRICS REPORT
                      (Signed Final 04/03/2013 [DATE])

                                                         CNM
Service(s) Provided

 US OB FOLLOW UP                                       76816.1
Indications

 Follow-up incomplete fetal anatomic evaluation
Fetal Evaluation

 Num Of Fetuses:    1
 Fetal Heart Rate:  146                         bpm
 Cardiac Activity:  Observed
 Presentation:      Cephalic
 Placenta:          Anterior, above cervical os
 P. Cord            Visualized, central
 Insertion:

 Amniotic Fluid
 AFI FV:      Subjectively within normal limits
                                             Larg Pckt:     4.4  cm
Biometry

 BPD:     48.4  mm    G. Age:   20w 4d                CI:        74.13   70 - 86
                                                      FL/HC:      18.0   15.9 -

 HC:     178.5  mm    G. Age:   20w 2d       30  %    HC/AC:      1.18   1.06 -

 AC:     151.3  mm    G. Age:   20w 2d       37  %    FL/BPD:
 FL:      32.1  mm    G. Age:   20w 0d       23  %    FL/AC:      21.2   20 - 24
 HUM:     31.4  mm    G. Age:   20w 3d       47  %

 Est. FW:     340  gm    0 lb 12 oz      39  %
Gestational Age

 LMP:           21w 2d       Date:   11/05/12                 EDD:   08/12/13
 U/S Today:     20w 2d                                        EDD:   08/19/13
 Best:          20w 4d    Det. By:   U/S (03/13/13)           EDD:   08/17/13
Anatomy

 Cranium:          Appears normal         Aortic Arch:      Appears normal
 Fetal Cavum:      Appears normal         Ductal Arch:      Appears normal
 Ventricles:       Appears normal         Diaphragm:        Appears normal
 Choroid Plexus:   Appears normal         Stomach:          Appears normal
 Cerebellum:       Previously seen        Abdomen:          Appears normal
 Posterior Fossa:  Previously seen        Abdominal Wall:   Previously seen
 Nuchal Fold:      Previously seen        Cord Vessels:     Previously seen
 Face:             Orbits and profile     Kidneys:          Appear normal
                   previously seen
 Lips:             Appears normal         Bladder:          Appears normal
 Heart:            Appears normal         Spine:            Previously seen
                   (4CH, axis, and
                   situs)
 RVOT:             Appears normal         Lower             Previously seen
                                          Extremities:
 LVOT:             Appears normal         Upper             Previously seen
                                          Extremities:

 Other:  Heels and 5th digit previously seen. Fetus appears to be a male.
Cervix Uterus Adnexa

 Cervical Length:   3.56      cm

 Cervix:       Normal appearance by transabdominal scan.
 Uterus:       No abnormality visualized.
 Cul De Sac:   No free fluid seen.
 Left Ovary:   Within normal limits.
 Right Ovary:  Within normal limits.

 Adnexa:     No abnormality visualized.
Impression

 Single intrauterine gestation demonstrating an estimated
 gestational age by ultrasound of 20w 2d. This is correlated
 with expected estimated gestational age by prior ultrasound
 of 20w 4d.

 An improved anatomic evaluation of the fetal heart and face
 was possible today and visualized anatomy appears normal.
 No focal placental abnormality is seen.

 Subjectively and quantitatively normal amniotic fluid volume.

 Normal cervical length and appearance.

## 2013-09-16 ENCOUNTER — Ambulatory Visit: Payer: Self-pay | Admitting: Obstetrics & Gynecology

## 2013-09-16 ENCOUNTER — Encounter: Payer: Self-pay | Admitting: *Deleted

## 2013-09-23 ENCOUNTER — Telehealth: Payer: Self-pay | Admitting: Obstetrics and Gynecology

## 2013-09-23 NOTE — Telephone Encounter (Signed)
Patient called requesting call back from staff. Returned call and no answer. Left message to return call to clinic.

## 2013-09-26 ENCOUNTER — Encounter (HOSPITAL_COMMUNITY): Payer: Self-pay

## 2013-09-26 ENCOUNTER — Inpatient Hospital Stay (HOSPITAL_COMMUNITY)
Admission: AD | Admit: 2013-09-26 | Discharge: 2013-09-26 | Disposition: A | Discharge status: Home / Self Care | Payer: Medicaid Other | Source: Ambulatory Visit | Attending: Obstetrics & Gynecology | Admitting: Obstetrics & Gynecology

## 2013-09-26 ENCOUNTER — Emergency Department (INDEPENDENT_AMBULATORY_CARE_PROVIDER_SITE_OTHER)
Admission: EM | Admit: 2013-09-26 | Discharge: 2013-09-26 | Disposition: A | Discharge status: Home / Self Care | Payer: Medicaid Other | Source: Home / Self Care | Attending: Emergency Medicine | Admitting: Emergency Medicine

## 2013-09-26 ENCOUNTER — Encounter (HOSPITAL_COMMUNITY): Payer: Self-pay | Admitting: *Deleted

## 2013-09-26 DIAGNOSIS — N939 Abnormal uterine and vaginal bleeding, unspecified: Secondary | ICD-10-CM

## 2013-09-26 DIAGNOSIS — O2390 Unspecified genitourinary tract infection in pregnancy, unspecified trimester: Secondary | ICD-10-CM | POA: Insufficient documentation

## 2013-09-26 DIAGNOSIS — N760 Acute vaginitis: Secondary | ICD-10-CM

## 2013-09-26 DIAGNOSIS — B9689 Other specified bacterial agents as the cause of diseases classified elsewhere: Secondary | ICD-10-CM | POA: Insufficient documentation

## 2013-09-26 DIAGNOSIS — R109 Unspecified abdominal pain: Secondary | ICD-10-CM

## 2013-09-26 DIAGNOSIS — A499 Bacterial infection, unspecified: Secondary | ICD-10-CM | POA: Insufficient documentation

## 2013-09-26 DIAGNOSIS — N898 Other specified noninflammatory disorders of vagina: Secondary | ICD-10-CM

## 2013-09-26 LAB — POCT URINALYSIS DIP (DEVICE)
Glucose, UA: NEGATIVE mg/dL
Leukocytes, UA: NEGATIVE
Nitrite: NEGATIVE
Protein, ur: NEGATIVE mg/dL
Urobilinogen, UA: 0.2 mg/dL (ref 0.0–1.0)

## 2013-09-26 LAB — CBC WITH DIFFERENTIAL/PLATELET
Basophils Absolute: 0 10*3/uL (ref 0.0–0.1)
Basophils Relative: 0 % (ref 0–1)
Eosinophils Absolute: 0.1 10*3/uL (ref 0.0–0.7)
HCT: 38 % (ref 36.0–46.0)
Hemoglobin: 13.1 g/dL (ref 12.0–15.0)
MCH: 29.2 pg (ref 26.0–34.0)
MCHC: 34.5 g/dL (ref 30.0–36.0)
Monocytes Absolute: 0.5 10*3/uL (ref 0.1–1.0)
Monocytes Relative: 6 % (ref 3–12)
Neutrophils Relative %: 54 % (ref 43–77)
RDW: 14 % (ref 11.5–15.5)

## 2013-09-26 LAB — URINALYSIS, ROUTINE W REFLEX MICROSCOPIC
Bilirubin Urine: NEGATIVE
Ketones, ur: NEGATIVE mg/dL
Nitrite: NEGATIVE
Protein, ur: NEGATIVE mg/dL
pH: 6 (ref 5.0–8.0)

## 2013-09-26 LAB — URINE MICROSCOPIC-ADD ON

## 2013-09-26 LAB — POCT PREGNANCY, URINE: Preg Test, Ur: NEGATIVE

## 2013-09-26 MED ORDER — METRONIDAZOLE 500 MG PO TABS: 500.0000 mg | ORAL_TABLET | Freq: Two times a day (BID) | ORAL | Status: DC

## 2013-09-26 NOTE — MAU Provider Note (Signed)
hgb stable.  Attestation of Attending Supervision of Advanced Practitioner (CNM/NP): Evaluation and management procedures were performed by the Advanced Practitioner under my supervision and collaboration. I have reviewed the Advanced Practitioner's note and chart, and I agree with the management and plan.  Ruth Aguinaldo H. 7:03 PM

## 2013-09-26 NOTE — MAU Note (Signed)
Received Depo prior to D/C from hospital after delivery.

## 2013-09-26 NOTE — MAU Note (Signed)
Pt presents with complaints of vaginal bleeding and she delivered on August the 20th. The bleeding hasn't  stopped since she delivered. She says it may stop for a day but starts back the very next day.

## 2013-09-26 NOTE — MAU Provider Note (Signed)
History     CSN: [id number]  Arrival date and time: 09/26/13 1616   None     Chief Complaint  Patient presents with  . Vaginal Bleeding   HPI Ruth Daniels is a 17 y.o. G84P1001 female 5wks s/p uncomplicated postdates SVD who reports continued vb, heavier for last week w/ nickel-sized clot few days ago. Changing 1 saturated pad/hr and cramps for last week.  States bleeding will lighten periodically for a day then return. Slightly dizzy/lightheaded when standing twice last week, not recently. Denies fever/chills. States she vomited x 2 last wed, none since. Bottlefeeding.  Depo prior to leaving hospital.  Had +CT week prior to delivery, given azithromycin 1gm x 1 when admitted. Has had unprotected sex since delivery. Missed 1st pp visit, now has one scheduled for 10/30.  OB History   Grav Para Term Preterm Abortions TAB SAB Ect Mult Living   1 1 1       1       Past Medical History  Diagnosis Date  . Allergy     Penicillin  . Eczema   . Chlamydia trachomatis infection in pregnancy     Past Surgical History  Procedure Laterality Date  . Foot surgery      Family History  Problem Relation Age of Onset  . Depression Mother     History  Substance Use Topics  . Smoking status: Current Every Day Smoker -- 0.25 packs/day    Types: Cigarettes  . Smokeless tobacco: Never Used  . Alcohol Use: No    Allergies:  Allergies  Allergen Reactions  . Penicillins Other (See Comments)    Childhood reaction unknown  . Latex Rash    No prescriptions prior to admission    Review of Systems  Constitutional: Negative.  Negative for fever and chills.  HENT: Negative.   Eyes: Negative.   Respiratory: Negative.  Negative for shortness of breath.   Cardiovascular: Negative.   Gastrointestinal: Vomiting: x 2 last week, none since. Abdominal pain: cramping.  Genitourinary: Negative.   Musculoskeletal: Negative.   Skin: Negative.   Neurological: Dizziness: when standing x2 last  week.  Endo/Heme/Allergies: Negative.   Psychiatric/Behavioral: Negative.    Physical Exam   Blood pressure 109/65, pulse 70, temperature 98 F (36.7 C), temperature source Oral, resp. rate 18, last menstrual period 09/26/2013, not currently breastfeeding.  Physical Exam  Constitutional: She appears well-developed and well-nourished.  HENT:  Head: Normocephalic.  Neck: Normal range of motion.  Cardiovascular: Normal rate and regular rhythm.   Respiratory: Effort normal and breath sounds normal.  GI: Soft. She exhibits no distension and no mass. Tenderness: mild suprapubic tenderness. There is no rebound and no guarding.  Genitourinary:  Spec exam: small amount dark red blood on vaginal walls, no active bleeding from os. + amine odor  Bimanual: uterus well involuted, no tenderness to palpation, no CMT, bilateral adnexa w/o tenderness or masses    MAU Course  Procedures  CBCw/diff UA Exam   Results for orders placed during the hospital encounter of 09/26/13 (from the past 24 hour(s))  URINALYSIS, ROUTINE W REFLEX MICROSCOPIC     Status: Abnormal   Collection Time    09/26/13  4:40 PM      Result Value Range   Color, Urine YELLOW  YELLOW   APPearance HAZY (*) CLEAR   Specific Gravity, Urine >1.030 (*) 1.005 - 1.030   pH 6.0  5.0 - 8.0   Glucose, UA NEGATIVE  NEGATIVE mg/dL   Hgb  urine dipstick LARGE (*) NEGATIVE   Bilirubin Urine NEGATIVE  NEGATIVE   Ketones, ur NEGATIVE  NEGATIVE mg/dL   Protein, ur NEGATIVE  NEGATIVE mg/dL   Urobilinogen, UA 0.2  0.0 - 1.0 mg/dL   Nitrite NEGATIVE  NEGATIVE   Leukocytes, UA SMALL (*) NEGATIVE  URINE MICROSCOPIC-ADD ON     Status: Abnormal   Collection Time    09/26/13  4:40 PM      Result Value Range   Squamous Epithelial / LPF MANY (*) RARE   WBC, UA 3-6  <3 WBC/hpf   RBC / HPF 21-50  <3 RBC/hpf   Bacteria, UA MANY (*) RARE  POCT PREGNANCY, URINE     Status: None   Collection Time    09/26/13  4:51 PM      Result Value Range    Preg Test, Ur NEGATIVE  NEGATIVE  WET PREP, GENITAL     Status: Abnormal   Collection Time    09/26/13  6:02 PM      Result Value Range   Yeast Wet Prep HPF POC NONE SEEN  NONE SEEN   Trich, Wet Prep NONE SEEN  NONE SEEN   Clue Cells Wet Prep HPF POC FEW (*) NONE SEEN   WBC, Wet Prep HPF POC FEW (*) NONE SEEN  CBC WITH DIFFERENTIAL     Status: None   Collection Time    09/26/13  6:13 PM      Result Value Range   WBC 7.8  4.0 - 10.5 K/uL   RBC 4.49  3.87 - 5.11 MIL/uL   Hemoglobin 13.1  12.0 - 15.0 g/dL   HCT 16.1  09.6 - 04.5 %   MCV 84.6  78.0 - 100.0 fL   MCH 29.2  26.0 - 34.0 pg   MCHC 34.5  30.0 - 36.0 g/dL   RDW 40.9  81.1 - 91.4 %   Platelets 256  150 - 400 K/uL   Neutrophils Relative % 54  43 - 77 %   Neutro Abs 4.2  1.7 - 7.7 K/uL   Lymphocytes Relative 39  12 - 46 %   Lymphs Abs 3.0  0.7 - 4.0 K/uL   Monocytes Relative 6  3 - 12 %   Monocytes Absolute 0.5  0.1 - 1.0 K/uL   Eosinophils Relative 1  0 - 5 %   Eosinophils Absolute 0.1  0.0 - 0.7 K/uL   Basophils Relative 0  0 - 1 %   Basophils Absolute 0.0  0.0 - 0.1 K/uL     Assessment and Plan  A:   5wks s/p uncomplicated SVD  BV  Continued lochia rubra vs. return of menses  Bottlefeeding  P:  D/C home  Rx Metronidazole 500mg  BID x 7d  S/p 1st depo ~5wks ago  Condoms for STI prevention  Gc/Ch pending  To keep pp visit as scheduled 10/30  Marge Duncans 09/26/2013, 5:47 PM

## 2013-09-26 NOTE — ED Notes (Addendum)
Reported dizziness x  1 week, trying to rest; continuing to have bleeding since birth of child 8-20; states she has pain lower abdominal area ,rates as 8 on 1-10 scale. Reportedly had called [facility] to try to get an earlier appointment than 10-31, but they have no openings in their schedule. Marland Kitchen

## 2013-09-26 NOTE — ED Provider Notes (Signed)
CSN: [id number]     Arrival date & time 09/26/13  1317 History   First MD Initiated Contact with Patient 09/26/13 1443     Chief Complaint  Patient presents with  . Dizziness   (Consider location/radiation/quality/duration/timing/severity/associated sxs/prior Treatment) HPI Comments: 17 year old female presents complaining of abdominal pain, vaginal bleeding, and dizziness. She gave birth via spontaneous vaginal delivery approximately one month ago and has had bleeding since that time. She has been having abdominal pain increasing for the past week. In the past couple of days, she is also started to have dizziness when standing for prolonged period of time going from seated to standing. She denies fever, chills, NVD, or vaginal discharge apart from the bleeding. She describes the bleeding as being "like a period." He abdominal pain this in the lower abdomen and is tender to touch, nonradiating   Past Medical History  Diagnosis Date  . Allergy     Penicillin  . Eczema   . Chlamydia trachomatis infection in pregnancy    Past Surgical History  Procedure Laterality Date  . Foot surgery     Family History  Problem Relation Age of Onset  . Depression Mother    History  Substance Use Topics  . Smoking status: Current Every Day Smoker -- 0.25 packs/day    Types: Cigarettes  . Smokeless tobacco: Never Used  . Alcohol Use: No   OB History   Grav Para Term Preterm Abortions TAB SAB Ect Mult Living   1 1 1       1      Review of Systems  Constitutional: Negative for fever and chills.  Eyes: Negative for visual disturbance.  Respiratory: Negative for cough and shortness of breath.   Cardiovascular: Negative for chest pain, palpitations and leg swelling.  Gastrointestinal: Positive for abdominal pain. Negative for nausea and vomiting.  Endocrine: Negative for polydipsia and polyuria.  Genitourinary: Positive for vaginal bleeding. Negative for dysuria, urgency and frequency.   Musculoskeletal: Negative for myalgias and arthralgias.  Skin: Negative for rash.  Neurological: Positive for dizziness. Negative for weakness and light-headedness.    Allergies  Penicillins and Latex  Home Medications   Current Outpatient Rx  Name  Route  Sig  Dispense  Refill  . metroNIDAZOLE (FLAGYL) 500 MG tablet   Oral   Take 1 tablet (500 mg total) by mouth 2 (two) times daily. X 7 days   14 tablet   0    BP 117/78  Pulse 74  Temp(Src) 99 F (37.2 C) (Oral)  Resp 18  SpO2 98%  LMP 09/26/2013 Physical Exam  Nursing note and vitals reviewed. Constitutional: She is oriented to person, place, and time. Vital signs are normal. She appears well-developed and well-nourished. No distress.  HENT:  Head: Normocephalic and atraumatic.  Pulmonary/Chest: Effort normal. No respiratory distress.  Abdominal: There is tenderness in the right lower quadrant and suprapubic area.  Neurological: She is alert and oriented to person, place, and time. She has normal strength. Coordination normal.  Skin: Skin is warm and dry. No rash noted. She is not diaphoretic.  Psychiatric: She has a normal mood and affect. Judgment normal.    ED Course  Procedures (including critical care time) Labs Review Labs Reviewed  POCT URINALYSIS DIP (DEVICE) - Abnormal; Notable for the following:    Ketones, ur TRACE (*)    Hgb urine dipstick MODERATE (*)    All other components within normal limits  POCT PREGNANCY, URINE   Imaging Review No results  found.  MDM   1. Vaginal bleeding   2. Abdominal pain    Transferring to MAU for evaluation of possible retained POC and early endometritis.     Graylon Good, PA-C 09/27/13 2107

## 2013-09-27 LAB — GC/CHLAMYDIA PROBE AMP: GC Probe RNA: NEGATIVE

## 2013-09-28 LAB — URINE CULTURE: Colony Count: 50000

## 2013-09-28 NOTE — ED Provider Notes (Signed)
Medical screening examination/treatment/procedure(s) were performed by non-physician practitioner and as supervising physician I was immediately available for consultation/collaboration.  Leslee Home, M.D.  Reuben Likes, MD 09/28/13 [phone]

## 2013-09-28 NOTE — Telephone Encounter (Signed)
Called Nayomi and she states" I'm ok, thanks for calling, but I'm ok "

## 2013-09-29 ENCOUNTER — Other Ambulatory Visit (HOSPITAL_COMMUNITY): Payer: Self-pay | Admitting: Obstetrics and Gynecology

## 2013-09-29 ENCOUNTER — Telehealth: Payer: Self-pay | Admitting: Obstetrics and Gynecology

## 2013-09-29 DIAGNOSIS — O2340 Unspecified infection of urinary tract in pregnancy, unspecified trimester: Secondary | ICD-10-CM | POA: Insufficient documentation

## 2013-09-29 DIAGNOSIS — B951 Streptococcus, group B, as the cause of diseases classified elsewhere: Secondary | ICD-10-CM

## 2013-09-29 HISTORY — DX: Unspecified infection of urinary tract in pregnancy, unspecified trimester: B95.1

## 2013-09-29 MED ORDER — CEPHALEXIN 500 MG PO CAPS: 500.0000 mg | ORAL_CAPSULE | Freq: Four times a day (QID) | ORAL | Status: DC

## 2013-09-29 NOTE — Telephone Encounter (Signed)
Notified Ms. Ruth Daniels that her urine culture came back positive for GBS; sent a presription for keflex to her pharmacy. Pt unable to take amoxicillin due to PCN allergy.

## 2013-10-29 ENCOUNTER — Encounter: Payer: Self-pay | Admitting: Advanced Practice Midwife

## 2013-10-29 ENCOUNTER — Ambulatory Visit (INDEPENDENT_AMBULATORY_CARE_PROVIDER_SITE_OTHER): Payer: Medicaid Other | Admitting: Advanced Practice Midwife

## 2013-10-29 VITALS — BP 128/87 | HR 72 | Temp 96.7°F | Ht 60.0 in | Wt 147.0 lb

## 2013-10-29 DIAGNOSIS — Z3049 Encounter for surveillance of other contraceptives: Secondary | ICD-10-CM

## 2013-10-29 DIAGNOSIS — Z30430 Encounter for insertion of intrauterine contraceptive device: Secondary | ICD-10-CM

## 2013-10-29 LAB — POCT PREGNANCY, URINE: Preg Test, Ur: NEGATIVE

## 2013-10-29 MED ORDER — LEVONORGESTREL 20 MCG/24HR IU IUD
INTRAUTERINE_SYSTEM | Freq: Once | INTRAUTERINE | Status: AC
  Administered 2013-10-29: 1 via INTRAUTERINE

## 2013-10-29 NOTE — Progress Notes (Signed)
  Subjective:     Ruth Daniels is a 17 y.o. female who presents for a postpartum visit. She is 8 weeks postpartum following a spontaneous vaginal delivery. I have fully reviewed the prenatal and intrapartum course. The delivery was at 40 gestational weeks. Outcome: spontaneous vaginal delivery. Anesthesia: epidural. Postpartum course has been normal. Baby's course has been normal. Baby is feeding by bottle. Bleeding no bleeding. Bowel function is normal. Bladder function is normal. Patient is sexually active. Contraception method is none. Postpartum depression screening: negative.  The following portions of the patient's history were reviewed and updated as appropriate: allergies, current medications, past family history, past medical history, past social history, past surgical history and problem list.  Review of Systems A comprehensive review of systems was negative.   Objective:    BP 128/87  Pulse 72  Temp(Src) 96.7 F (35.9 C) (Oral)  Ht 5' (1.524 m)  Wt 147 lb (66.679 kg)  BMI 28.71 kg/m2  Breastfeeding? No  General:  alert, cooperative and no distress   Breasts:  inspection negative, no nipple discharge or bleeding, no masses or nodularity palpable  Lungs: clear to auscultation bilaterally  Heart:  regular rate and rhythm, S1, S2 normal, no murmur, click, rub or gallop  Abdomen: soft, non-tender; bowel sounds normal; no masses,  no organomegaly   Vulva:  normal  Vagina: normal vagina  Cervix:  multiparous appearance, no cervical motion tenderness and no lesions  Corpus: normal size, contour, position, consistency, mobility, non-tender  Adnexa:  normal adnexa and no mass, fullness, tenderness  Rectal Exam: Not performed.        IUD Procedure Note Patient identified, informed consent performed.  Discussed risks of irregular bleeding, cramping, infection, malpositioning or misplacement of the IUD outside the uterus which may require further procedures. Time out was  performed.  Urine pregnancy test negative.  Speculum placed in the vagina.  Cervix visualized.  Cleaned with Betadine x 2.  Grasped anteriorly with a single tooth tenaculum.  Uterus sounded to 7 cm.  Mirena IUD placed per manufacturer's recommendations.  Strings trimmed to 3 cm. Tenaculum was removed, good hemostasis noted.  Patient tolerated procedure well.   Patient was given post-procedure instructions and the Mirena care card with expiration date.  Patient was also asked to check IUD strings periodically and follow up in 4-6 weeks for IUD check. Assessment:     Normal postpartum exam. Mirena placed at today's visit .   Plan:    1. Contraception: IUD 2. Follow up in: 4 weeks for Mirena check or as needed.

## 2013-11-04 ENCOUNTER — Encounter: Payer: Self-pay | Admitting: *Deleted

## 2013-11-19 ENCOUNTER — Emergency Department (HOSPITAL_COMMUNITY)
Admission: EM | Admit: 2013-11-19 | Discharge: 2013-11-19 | Disposition: A | Discharge status: Home / Self Care | Payer: Medicaid Other | Attending: Emergency Medicine | Admitting: Emergency Medicine

## 2013-11-19 ENCOUNTER — Encounter (HOSPITAL_COMMUNITY): Payer: Self-pay | Admitting: Emergency Medicine

## 2013-11-19 DIAGNOSIS — Z91040 Latex allergy status: Secondary | ICD-10-CM | POA: Insufficient documentation

## 2013-11-19 DIAGNOSIS — F17200 Nicotine dependence, unspecified, uncomplicated: Secondary | ICD-10-CM | POA: Insufficient documentation

## 2013-11-19 DIAGNOSIS — M25569 Pain in unspecified knee: Secondary | ICD-10-CM | POA: Insufficient documentation

## 2013-11-19 DIAGNOSIS — Z8619 Personal history of other infectious and parasitic diseases: Secondary | ICD-10-CM | POA: Insufficient documentation

## 2013-11-19 DIAGNOSIS — Z880 Allergy status to penicillin: Secondary | ICD-10-CM | POA: Insufficient documentation

## 2013-11-19 DIAGNOSIS — M25562 Pain in left knee: Secondary | ICD-10-CM

## 2013-11-19 DIAGNOSIS — Z872 Personal history of diseases of the skin and subcutaneous tissue: Secondary | ICD-10-CM | POA: Insufficient documentation

## 2013-11-19 DIAGNOSIS — R52 Pain, unspecified: Secondary | ICD-10-CM | POA: Insufficient documentation

## 2013-11-19 HISTORY — DX: Unspecified infection of urinary tract in pregnancy, unspecified trimester: O23.40

## 2013-11-19 HISTORY — DX: Streptococcus, group b, as the cause of diseases classified elsewhere: B95.1

## 2013-11-19 MED ORDER — TRAMADOL HCL 50 MG PO TABS: 50.0000 mg | ORAL_TABLET | Freq: Four times a day (QID) | ORAL | Status: DC | PRN

## 2013-11-19 MED ORDER — MELOXICAM 15 MG PO TABS: 15.0000 mg | ORAL_TABLET | Freq: Every day | ORAL | Status: DC

## 2013-11-19 NOTE — ED Provider Notes (Signed)
CSN: [id number]     Arrival date & time 11/19/13  1047 History  This chart was scribed for non-physician practitioner Rhea Bleacher PA-C working with Celene Kras, MD by Leone Payor, ED Scribe. This patient was seen in room TR07C/TR07C and the patient's care was started at 1047.    Chief Complaint  Patient presents with  . Knee Pain    The history is provided by the patient. No language interpreter was used.    HPI Comments: Ruth Daniels is a 17 y.o. female who presents to the Emergency Department complaining of constant, unchanged left knee pain that began 2 days ago. Pt denies any recent falls or injuries. She states she had a sports injury that occurred 1 year ago. Pt states she was seen in the ED with negative XRAYs and was given a knee brace at that time. Pt states she has occasional episodes of exacerbation. She has tried tylenol and ibuprofen along with ice and elevation without relief. She denies fever, nausea, vomiting, numbness or tingling.   Past Medical History  Diagnosis Date  . Allergy     Penicillin  . Eczema   . Chlamydia trachomatis infection in pregnancy   . GBS (group B streptococcus) UTI complicating pregnancy 09/29/2013   Past Surgical History  Procedure Laterality Date  . Foot surgery     Family History  Problem Relation Age of Onset  . Depression Mother    History  Substance Use Topics  . Smoking status: Current Every Day Smoker -- 0.25 packs/day    Types: Cigarettes  . Smokeless tobacco: Never Used  . Alcohol Use: No   OB History   Grav Para Term Preterm Abortions TAB SAB Ect Mult Living   1 1 1       1      Review of Systems  Constitutional: Positive for activity change. Negative for fever.  Gastrointestinal: Negative for nausea and vomiting.  Musculoskeletal: Positive for arthralgias (left knee pain). Negative for back pain, joint swelling and neck pain.  Skin: Negative for wound.  Neurological: Negative for weakness and numbness.     Allergies  Penicillins and Latex  Home Medications   Current Outpatient Rx  Name  Route  Sig  Dispense  Refill  . acetaminophen (TYLENOL) 325 MG tablet   Oral   Take 650 mg by mouth every 6 (six) hours as needed for mild pain.         Marland Kitchen ibuprofen (ADVIL,MOTRIN) 200 MG tablet   Oral   Take 200-400 mg by mouth every 4 (four) hours as needed for moderate pain.          BP 117/93  Pulse 68  Temp(Src) 98.7 F (37.1 C) (Oral)  Resp 18  Ht 5\' 1"  (1.549 m)  Wt 152 lb 4 oz (69.06 kg)  BMI 28.78 kg/m2  SpO2 97% Physical Exam  Nursing note and vitals reviewed. Constitutional: She appears well-developed and well-nourished.  HENT:  Head: Normocephalic and atraumatic.  Eyes: Pupils are equal, round, and reactive to light.  Neck: Normal range of motion. Neck supple.  Cardiovascular: Normal rate.  Exam reveals no decreased pulses.   Pulmonary/Chest: Effort normal.  Abdominal: She exhibits no distension.  Musculoskeletal: She exhibits tenderness. She exhibits no edema.  Generalized tenderness, with tenderness more pronounced over lateral and medial joint lines. Full ROM of knee with mild discomfort. Normal gait.   Neurological: She is alert. No sensory deficit.  Motor, sensation, and vascular distal to the injury is  fully intact.   Skin: Skin is warm and dry.  Psychiatric: She has a normal mood and affect.    ED Course  Procedures   DIAGNOSTIC STUDIES: Oxygen Saturation is 97% on RA, adequate by my interpretation.    COORDINATION OF CARE: 11:36 AM Discussed treatment plan with pt at bedside and pt agreed to plan.   Labs Review Labs Reviewed - No data to display Imaging Review No results found.  EKG Interpretation   None      Vital signs reviewed and are as follows: Filed Vitals:   11/19/13 1055  BP: 117/93  Pulse: 68  Temp: 98.7 F (37.1 C)  Resp: 18    11:51 AM Patient was counseled on RICE protocol and told to rest injury, use ice for no longer than  15 minutes every hour, compress the area, and elevate above the level of their heart as much as possible to reduce swelling.  Questions answered.  Patient verbalized understanding.    Crutches and ACE by nurse. Ortho f/u given. Patient verbalizes understanding and agrees with plan.  Patient urged to return with worsening symptoms or other concerns. Patient verbalized understanding and agrees with plan.    MDM   1. Knee pain, acute, left    Patient with exacerbation of chronic, intermittent knee pain. Patient with full range of motion. She does have some joint line tenderness. No recent new injury. Do not feel imaging is indicated today. Rice protocol and NSAIDs indicated with orthopedic followup for definitive diagnosis. Lower extremity is neurovascularly intact.  I personally performed the services described in this documentation, which was scribed in my presence. The recorded information has been reviewed and is accurate.   Renne Crigler, PA-C 11/19/13 1152

## 2013-11-19 NOTE — ED Notes (Signed)
Pt is [age] crutches were sized for  her and instructions in use which she states that she understands ace wrap applied to left knee and  Pt was given instructions were given on how to apply which she staes she understands

## 2013-11-19 NOTE — ED Provider Notes (Signed)
Medical screening examination/treatment/procedure(s) were performed by non-physician practitioner and as supervising physician I was immediately available for consultation/collaboration.  EKG Interpretation   None         Celene Kras, MD 11/19/13 [phone]

## 2013-11-19 NOTE — ED Notes (Signed)
Pt reports hx of left knee injury and still having pain. Ambulatory at triage, no acute distress noted.

## 2013-11-19 NOTE — ED Notes (Signed)
Left  Knee pain was really bad this am states had  (Same) knee injury a year or 2 ago and saw er for that and then saw another dr in [place] for same was told she had ? Water on knee has not had any f/u did take some tylenol this am

## 2014-01-23 ENCOUNTER — Emergency Department (HOSPITAL_COMMUNITY)
Admission: EM | Admit: 2014-01-23 | Discharge: 2014-01-23 | Disposition: A | Discharge status: Home / Self Care | Payer: Medicaid Other | Attending: Emergency Medicine | Admitting: Emergency Medicine

## 2014-01-23 ENCOUNTER — Encounter (HOSPITAL_COMMUNITY): Payer: Self-pay | Admitting: Emergency Medicine

## 2014-01-23 ENCOUNTER — Emergency Department (HOSPITAL_COMMUNITY): Payer: Medicaid Other

## 2014-01-23 DIAGNOSIS — R110 Nausea: Secondary | ICD-10-CM | POA: Insufficient documentation

## 2014-01-23 DIAGNOSIS — Z91040 Latex allergy status: Secondary | ICD-10-CM | POA: Insufficient documentation

## 2014-01-23 DIAGNOSIS — Z87440 Personal history of urinary (tract) infections: Secondary | ICD-10-CM | POA: Insufficient documentation

## 2014-01-23 DIAGNOSIS — Z8619 Personal history of other infectious and parasitic diseases: Secondary | ICD-10-CM | POA: Insufficient documentation

## 2014-01-23 DIAGNOSIS — Z3202 Encounter for pregnancy test, result negative: Secondary | ICD-10-CM | POA: Insufficient documentation

## 2014-01-23 DIAGNOSIS — Z872 Personal history of diseases of the skin and subcutaneous tissue: Secondary | ICD-10-CM | POA: Insufficient documentation

## 2014-01-23 DIAGNOSIS — R042 Hemoptysis: Secondary | ICD-10-CM | POA: Insufficient documentation

## 2014-01-23 DIAGNOSIS — F17200 Nicotine dependence, unspecified, uncomplicated: Secondary | ICD-10-CM | POA: Insufficient documentation

## 2014-01-23 DIAGNOSIS — Z880 Allergy status to penicillin: Secondary | ICD-10-CM | POA: Insufficient documentation

## 2014-01-23 DIAGNOSIS — J069 Acute upper respiratory infection, unspecified: Secondary | ICD-10-CM | POA: Insufficient documentation

## 2014-01-23 LAB — URINALYSIS W MICROSCOPIC + REFLEX CULTURE
Bilirubin Urine: NEGATIVE
GLUCOSE, UA: NEGATIVE mg/dL
HGB URINE DIPSTICK: NEGATIVE
Ketones, ur: 40 mg/dL — AB
Nitrite: NEGATIVE
PROTEIN: NEGATIVE mg/dL
Specific Gravity, Urine: 1.037 — ABNORMAL HIGH (ref 1.005–1.030)
UROBILINOGEN UA: 1 mg/dL (ref 0.0–1.0)
pH: 6.5 (ref 5.0–8.0)

## 2014-01-23 LAB — POCT PREGNANCY, URINE: Preg Test, Ur: NEGATIVE

## 2014-01-23 MED ORDER — ONDANSETRON 8 MG PO TBDP: 8.0000 mg | ORAL_TABLET | Freq: Three times a day (TID) | ORAL | Status: DC | PRN

## 2014-01-23 MED ORDER — HYDROCOD POLST-CHLORPHEN POLST 10-8 MG/5ML PO LQCR
5.0000 mL | Freq: Once | ORAL | Status: AC
  Administered 2014-01-23: 5 mL via ORAL
  Filled 2014-01-23: qty 5

## 2014-01-23 MED ORDER — BENZONATATE 100 MG PO CAPS: 100.0000 mg | ORAL_CAPSULE | Freq: Three times a day (TID) | ORAL | Status: DC

## 2014-01-23 MED ORDER — ONDANSETRON HCL 4 MG/2ML IJ SOLN
4.0000 mg | Freq: Once | INTRAMUSCULAR | Status: DC
  Filled 2014-01-23: qty 2

## 2014-01-23 MED ORDER — ACETAMINOPHEN 325 MG PO TABS
650.0000 mg | ORAL_TABLET | Freq: Once | ORAL | Status: AC
  Administered 2014-01-23: 650 mg via ORAL
  Filled 2014-01-23: qty 2

## 2014-01-23 NOTE — ED Provider Notes (Signed)
Medical screening examination/treatment/procedure(s) were performed by non-physician practitioner and as supervising physician I was immediately available for consultation/collaboration.  EKG Interpretation   None         Houston Siren, MD 01/23/14 1651

## 2014-01-23 NOTE — Discharge Instructions (Signed)
Zofran for nausea.Tessalon for cough. Rest. Fluids. Follow up with primary care doctor.   Upper Respiratory Infection, Adult An upper respiratory infection (URI) is also known as the common cold. It is often caused by a type of germ (virus). Colds are easily spread (contagious). You can pass it to others by kissing, coughing, sneezing, or drinking out of the same glass. Usually, you get better in 1 or 2 weeks.  HOME CARE   Only take medicine as told by your doctor.  Use a warm mist humidifier or breathe in steam from a hot shower.  Drink enough water and fluids to keep your pee (urine) clear or pale yellow.  Get plenty of rest.  Return to work when your temperature is back to normal or as told by your doctor. You may use a face mask and wash your hands to stop your cold from spreading. GET HELP RIGHT AWAY IF:   After the first few days, you feel you are getting worse.  You have questions about your medicine.  You have chills, shortness of breath, or brown or red spit (mucus).  You have yellow or brown snot (nasal discharge) or pain in the face, especially when you bend forward.  You have a fever, puffy (swollen) neck, pain when you swallow, or white spots in the back of your throat.  You have a bad headache, ear pain, sinus pain, or chest pain.  You have a high-pitched whistling sound when you breathe in and out (wheezing).  You have a lasting cough or cough up blood.  You have sore muscles or a stiff neck. MAKE SURE YOU:   Understand these instructions.  Will watch your condition.  Will get help right away if you are not doing well or get worse. Document Released: 06/04/2008 Document Revised: 03/10/2012 Document Reviewed: 04/23/2011 [employer] [facility] Patient Information 2014 [place], [place].

## 2014-01-23 NOTE — ED Notes (Signed)
PER EMS- pt c/o "feeling sick", with bodyaches, weakness, hemoptysis, cough, and possible fever.

## 2014-01-23 NOTE — ED Provider Notes (Signed)
CSN: [id number]     Arrival date & time 01/23/14  1107 History   First MD Initiated Contact with Patient 01/23/14 1153     Chief Complaint  Patient presents with  . Cough  . Hemoptysis   (Consider location/radiation/quality/duration/timing/severity/associated sxs/prior Treatment) HPI Ruth Daniels is a 18 y.o. female who presents emergency department complaining of generalized weakness, body aches, chills, cough, sore throat, congestion. Patient states symptoms began yesterday. States worsened this morning. She took Advil for her symptoms and I did not feel better called 911. Patient denies any nausea or vomiting. No diarrhea. No chest pain or abdominal pain. She did not try any other medications prior to coming in. She is otherwise healthy. Denies urinary or vaginal symptoms.   Past Medical History  Diagnosis Date  . Allergy     Penicillin  . Eczema   . Chlamydia trachomatis infection in pregnancy   . GBS (group B streptococcus) UTI complicating pregnancy 3/66/4403   Past Surgical History  Procedure Laterality Date  . Foot surgery     Family History  Problem Relation Age of Onset  . Depression Mother    History  Substance Use Topics  . Smoking status: Current Every Day Smoker -- 0.25 packs/day    Types: Cigarettes  . Smokeless tobacco: Never Used  . Alcohol Use: No   OB History   Grav Para Term Preterm Abortions TAB SAB Ect Mult Living   1 1 1       1      Review of Systems  Constitutional: Positive for chills and fatigue. Negative for fever.  HENT: Positive for congestion, postnasal drip, sinus pressure and sore throat.   Respiratory: Positive for cough. Negative for chest tightness and shortness of breath.   Cardiovascular: Negative for chest pain, palpitations and leg swelling.  Gastrointestinal: Negative for nausea, vomiting, abdominal pain and diarrhea.  Genitourinary: Negative for dysuria, flank pain, vaginal bleeding, vaginal discharge, vaginal pain and  pelvic pain.  Musculoskeletal: Positive for myalgias. Negative for neck pain and neck stiffness.  Skin: Negative for rash.  Neurological: Positive for weakness. Negative for dizziness and headaches.  All other systems reviewed and are negative.    Allergies  Penicillins and Latex  Home Medications   Current Outpatient Rx  Name  Route  Sig  Dispense  Refill  . ibuprofen (ADVIL,MOTRIN) 200 MG tablet   Oral   Take 200 mg by mouth every 4 (four) hours as needed for moderate pain.          Marland Kitchen levonorgestrel (MIRENA) 20 MCG/24HR IUD   Intrauterine   1 each by Intrauterine route once.          BP 142/66  Pulse 81  Temp(Src) 98.6 F (37 C) (Oral)  Resp 16  SpO2 97% Physical Exam  Nursing note and vitals reviewed. Constitutional: She is oriented to person, place, and time. She appears well-developed and well-nourished. No distress.  HENT:  Head: Normocephalic.  Right Ear: External ear normal.  Left Ear: External ear normal.  Mouth/Throat: Oropharynx is clear and moist.  Clear nasal congestion.  Eyes: Conjunctivae are normal.  Neck: Neck supple.  Cardiovascular: Normal rate, regular rhythm and normal heart sounds.   Pulmonary/Chest: Effort normal and breath sounds normal. No respiratory distress. She has no wheezes. She has no rales.  Abdominal: Soft. Bowel sounds are normal. She exhibits no distension. There is no tenderness. There is no rebound.  Musculoskeletal: She exhibits no edema.  Neurological: She is alert and oriented  to person, place, and time.  Skin: Skin is warm and dry.  Psychiatric: She has a normal mood and affect. Her behavior is normal.    ED Course  Procedures (including critical care time) Labs Review Labs Reviewed  URINALYSIS W MICROSCOPIC + REFLEX CULTURE - Abnormal; Notable for the following:    APPearance CLOUDY (*)    Specific Gravity, Urine 1.037 (*)    Ketones, ur 40 (*)    Leukocytes, UA SMALL (*)    All other components within normal  limits  POCT PREGNANCY, URINE   Imaging Review Dg Chest 2 View  01/23/2014   CLINICAL DATA:  Chest pain and cough for 2 days  EXAM: CHEST  2 VIEW  COMPARISON:  None.  FINDINGS: The heart size and mediastinal contours are within normal limits. Both lungs are clear. The visualized skeletal structures are unremarkable. Mild S shaped thoracolumbar curvature is noted without vertebral body anomaly identified.  IMPRESSION: No active cardiopulmonary disease.   Electronically Signed   By: Conchita Paris M.D.   On: 01/23/2014 13:51    EKG Interpretation   None       MDM   1. URI (upper respiratory infection)   2. Nausea     Pt presented here by EMS for flu like symptoms. VS normal. Exam unremarkable. Meds given in ED  Medications  ondansetron (ZOFRAN) injection 4 mg (4 mg Intravenous Not Given 01/23/14 1311)  chlorpheniramine-HYDROcodone (TUSSIONEX) 10-8 MG/5ML suspension 5 mL (5 mLs Oral Given 01/23/14 1307)  acetaminophen (TYLENOL) tablet 650 mg (650 mg Oral Given 01/23/14 1307)   Pt reassessed. Feeling better. CXR negative. Not pregnant. Home with symptomatic treatment. Suspect a viral URI. Pt otherwise healthy. No further testing indicated.   Filed Vitals:   01/23/14 1114 01/23/14 1121 01/23/14 1522  BP:  142/66 124/48  Pulse:  81 61  Temp:  98.6 F (37 C)   TempSrc:  Oral   Resp:  16 16  SpO2: 98% 97% 98%      Zina Pitzer A Renise Gillies, PA-C 01/23/14 1621

## 2014-02-05 ENCOUNTER — Encounter (HOSPITAL_COMMUNITY): Payer: Self-pay | Admitting: Emergency Medicine

## 2014-02-05 ENCOUNTER — Emergency Department (HOSPITAL_COMMUNITY)
Admission: EM | Admit: 2014-02-05 | Discharge: 2014-02-05 | Disposition: A | Discharge status: Home / Self Care | Payer: Medicaid Other | Attending: Emergency Medicine | Admitting: Emergency Medicine

## 2014-02-05 DIAGNOSIS — Z87440 Personal history of urinary (tract) infections: Secondary | ICD-10-CM | POA: Insufficient documentation

## 2014-02-05 DIAGNOSIS — Z872 Personal history of diseases of the skin and subcutaneous tissue: Secondary | ICD-10-CM | POA: Insufficient documentation

## 2014-02-05 DIAGNOSIS — F17200 Nicotine dependence, unspecified, uncomplicated: Secondary | ICD-10-CM | POA: Insufficient documentation

## 2014-02-05 DIAGNOSIS — Z880 Allergy status to penicillin: Secondary | ICD-10-CM | POA: Insufficient documentation

## 2014-02-05 DIAGNOSIS — Z91040 Latex allergy status: Secondary | ICD-10-CM | POA: Insufficient documentation

## 2014-02-05 DIAGNOSIS — IMO0001 Reserved for inherently not codable concepts without codable children: Secondary | ICD-10-CM | POA: Insufficient documentation

## 2014-02-05 DIAGNOSIS — J069 Acute upper respiratory infection, unspecified: Secondary | ICD-10-CM

## 2014-02-05 DIAGNOSIS — Z8619 Personal history of other infectious and parasitic diseases: Secondary | ICD-10-CM | POA: Insufficient documentation

## 2014-02-05 DIAGNOSIS — J329 Chronic sinusitis, unspecified: Secondary | ICD-10-CM | POA: Insufficient documentation

## 2014-02-05 LAB — RAPID STREP SCREEN (MED CTR MEBANE ONLY): STREPTOCOCCUS, GROUP A SCREEN (DIRECT): NEGATIVE

## 2014-02-05 MED ORDER — FLUTICASONE PROPIONATE 50 MCG/ACT NA SUSP: 2.0000 | Freq: Every day | NASAL | Status: DC

## 2014-02-05 NOTE — ED Notes (Signed)
Pt in c/o cough and sore throat since Wednesday, also body aches and chills, unsure of fever, no distress noted

## 2014-02-05 NOTE — Discharge Instructions (Signed)
Rest, tay well hydrated, use mucinex over-the-counter as discussed. Use flonase as directed.  Sinusitis Sinusitis is redness, soreness, and swelling (inflammation) of the paranasal sinuses. Paranasal sinuses are air pockets within the bones of your face (beneath the eyes, the middle of the forehead, or above the eyes). In healthy paranasal sinuses, mucus is able to drain out, and air is able to circulate through them by way of your nose. However, when your paranasal sinuses are inflamed, mucus and air can become trapped. This can allow bacteria and other germs to grow and cause infection. Sinusitis can develop quickly and last only a short time (acute) or continue over a long period (chronic). Sinusitis that lasts for more than 12 weeks is considered chronic.  CAUSES  Causes of sinusitis include:  Allergies.  Structural abnormalities, such as displacement of the cartilage that separates your nostrils (deviated septum), which can decrease the air flow through your nose and sinuses and affect sinus drainage.  Functional abnormalities, such as when the small hairs (cilia) that line your sinuses and help remove mucus do not work properly or are not present. SYMPTOMS  Symptoms of acute and chronic sinusitis are the same. The primary symptoms are pain and pressure around the affected sinuses. Other symptoms include:  Upper toothache.  Earache.  Headache.  Bad breath.  Decreased sense of smell and taste.  A cough, which worsens when you are lying flat.  Fatigue.  Fever.  Thick drainage from your nose, which often is green and may contain pus (purulent).  Swelling and warmth over the affected sinuses. DIAGNOSIS  Your caregiver will perform a physical exam. During the exam, your caregiver may:  Look in your nose for signs of abnormal growths in your nostrils (nasal polyps).  Tap over the affected sinus to check for signs of infection.  View the inside of your sinuses (endoscopy) with  a special imaging device with a light attached (endoscope), which is inserted into your sinuses. If your caregiver suspects that you have chronic sinusitis, one or more of the following tests may be recommended:  Allergy tests.  Nasal culture A sample of mucus is taken from your nose and sent to a lab and screened for bacteria.  Nasal cytology A sample of mucus is taken from your nose and examined by your caregiver to determine if your sinusitis is related to an allergy. TREATMENT  Most cases of acute sinusitis are related to a viral infection and will resolve on their own within 10 days. Sometimes medicines are prescribed to help relieve symptoms (pain medicine, decongestants, nasal steroid sprays, or saline sprays).  However, for sinusitis related to a bacterial infection, your caregiver will prescribe antibiotic medicines. These are medicines that will help kill the bacteria causing the infection.  Rarely, sinusitis is caused by a fungal infection. In theses cases, your caregiver will prescribe antifungal medicine. For some cases of chronic sinusitis, surgery is needed. Generally, these are cases in which sinusitis recurs more than 3 times per year, despite other treatments. HOME CARE INSTRUCTIONS   Drink plenty of water. Water helps thin the mucus so your sinuses can drain more easily.  Use a humidifier.  Inhale steam 3 to 4 times a day (for example, sit in the bathroom with the shower running).  Apply a warm, moist washcloth to your face 3 to 4 times a day, or as directed by your caregiver.  Use saline nasal sprays to help moisten and clean your sinuses.  Take over-the-counter or prescription medicines  for pain, discomfort, or fever only as directed by your caregiver. SEEK IMMEDIATE MEDICAL CARE IF:  You have increasing pain or severe headaches.  You have nausea, vomiting, or drowsiness.  You have swelling around your face.  You have vision problems.  You have a stiff  neck.  You have difficulty breathing. MAKE SURE YOU:   Understand these instructions.  Will watch your condition.  Will get help right away if you are not doing well or get worse. Document Released: 12/17/2005 Document Revised: 03/10/2012 Document Reviewed: 01/01/2012 [facility] Patient Information 2014 [place], [place].  Upper Respiratory Infection, Adult An upper respiratory infection (URI) is also sometimes known as the common cold. The upper respiratory tract includes the nose, sinuses, throat, trachea, and bronchi. Bronchi are the airways leading to the lungs. Most people improve within 1 week, but symptoms can last up to 2 weeks. A residual cough may last even longer.  CAUSES Many different viruses can infect the tissues lining the upper respiratory tract. The tissues become irritated and inflamed and often become very moist. Mucus production is also common. A cold is contagious. You can easily spread the virus to others by oral contact. This includes kissing, sharing a glass, coughing, or sneezing. Touching your mouth or nose and then touching a surface, which is then touched by another person, can also spread the virus. SYMPTOMS  Symptoms typically develop 1 to 3 days after you come in contact with a cold virus. Symptoms vary from person to person. They may include:  Runny nose.  Sneezing.  Nasal congestion.  Sinus irritation.  Sore throat.  Loss of voice (laryngitis).  Cough.  Fatigue.  Muscle aches.  Loss of appetite.  Headache.  Low-grade fever. DIAGNOSIS  You might diagnose your own cold based on familiar symptoms, since most people get a cold 2 to 3 times a year. Your caregiver can confirm this based on your exam. Most importantly, your caregiver can check that your symptoms are not due to another disease such as strep throat, sinusitis, pneumonia, asthma, or epiglottitis. Blood tests, throat tests, and X-rays are not necessary to diagnose a common cold, but they  may sometimes be helpful in excluding other more serious diseases. Your caregiver will decide if any further tests are required. RISKS AND COMPLICATIONS  You may be at risk for a more severe case of the common cold if you smoke cigarettes, have chronic heart disease (such as heart failure) or lung disease (such as asthma), or if you have a weakened immune system. The very young and very old are also at risk for more serious infections. Bacterial sinusitis, middle ear infections, and bacterial pneumonia can complicate the common cold. The common cold can worsen asthma and chronic obstructive pulmonary disease (COPD). Sometimes, these complications can require emergency medical care and may be life-threatening. PREVENTION  The best way to protect against getting a cold is to practice good hygiene. Avoid oral or hand contact with people with cold symptoms. Wash your hands often if contact occurs. There is no clear evidence that vitamin C, vitamin E, echinacea, or exercise reduces the chance of developing a cold. However, it is always recommended to get plenty of rest and practice good nutrition. TREATMENT  Treatment is directed at relieving symptoms. There is no cure. Antibiotics are not effective, because the infection is caused by a virus, not by bacteria. Treatment may include:  Increased fluid intake. Sports drinks offer valuable electrolytes, sugars, and fluids.  Breathing heated mist or steam (vaporizer  or shower).  Eating chicken soup or other clear broths, and maintaining good nutrition.  Getting plenty of rest.  Using gargles or lozenges for comfort.  Controlling fevers with ibuprofen or acetaminophen as directed by your caregiver.  Increasing usage of your inhaler if you have asthma. Zinc gel and zinc lozenges, taken in the first 24 hours of the common cold, can shorten the duration and lessen the severity of symptoms. Pain medicines may help with fever, muscle aches, and throat pain. A  variety of non-prescription medicines are available to treat congestion and runny nose. Your caregiver can make recommendations and may suggest nasal or lung inhalers for other symptoms.  HOME CARE INSTRUCTIONS   Only take over-the-counter or prescription medicines for pain, discomfort, or fever as directed by your caregiver.  Use a warm mist humidifier or inhale steam from a shower to increase air moisture. This may keep secretions moist and make it easier to breathe.  Drink enough water and fluids to keep your urine clear or pale yellow.  Rest as needed.  Return to work when your temperature has returned to normal or as your caregiver advises. You may need to stay home longer to avoid infecting others. You can also use a face mask and careful hand washing to prevent spread of the virus. SEEK MEDICAL CARE IF:   After the first few days, you feel you are getting worse rather than better.  You need your caregiver's advice about medicines to control symptoms.  You develop chills, worsening shortness of breath, or brown or red sputum. These may be signs of pneumonia.  You develop yellow or brown nasal discharge or pain in the face, especially when you bend forward. These may be signs of sinusitis.  You develop a fever, swollen neck glands, pain with swallowing, or white areas in the back of your throat. These may be signs of strep throat. SEEK IMMEDIATE MEDICAL CARE IF:   You have a fever.  You develop severe or persistent headache, ear pain, sinus pain, or chest pain.  You develop wheezing, a prolonged cough, cough up blood, or have a change in your usual mucus (if you have chronic lung disease).  You develop sore muscles or a stiff neck. Document Released: 06/12/2001 Document Revised: 03/10/2012 Document Reviewed: 04/20/2011 [facility] Patient Information 2014 [place], [place].

## 2014-02-05 NOTE — ED Provider Notes (Signed)
Medical screening examination/treatment/procedure(s) were performed by non-physician practitioner and as supervising physician I was immediately available for consultation/collaboration.  EKG Interpretation   None         Kristen N Ward, DO 02/05/14 1453 

## 2014-02-05 NOTE — ED Provider Notes (Signed)
CSN: [id number]     Arrival date & time 02/05/14  1116 History  This chart was scribed for Michele Mcalpine, non-physician practitioner, working with Delice Bison Ward, DO by Jenne Campus, ED Scribe. This patient was seen in room TR06C/TR06C and the patient's care was started at 11:29 AM.    Chief Complaint  Patient presents with  . URI    The history is provided by the patient. No language interpreter was used.    HPI Comments: Ruth Daniels is a 18 y.o. female who presents to the Emergency Department complaining of constant sore throat with associated cough, nasal congestion, myalgias and chills that started 2 days ago. The sore throat is worse with swallowing. She reports using throat sprays and lozenges cough drops with no improvement. She denies any known sick contacts. She also denies any fevers.    Past Medical History  Diagnosis Date  . Allergy     Penicillin  . Eczema   . Chlamydia trachomatis infection in pregnancy   . GBS (group B streptococcus) UTI complicating pregnancy 4/70/9628   Past Surgical History  Procedure Laterality Date  . Foot surgery     Family History  Problem Relation Age of Onset  . Depression Mother    History  Substance Use Topics  . Smoking status: Current Every Day Smoker -- 0.25 packs/day    Types: Cigarettes  . Smokeless tobacco: Never Used  . Alcohol Use: No   OB History   Grav Para Term Preterm Abortions TAB SAB Ect Mult Living   1 1 1       1      Review of Systems  A complete 10 system review of systems was obtained and all systems are negative except as noted in the HPI and PMH.   Allergies  Penicillins and Latex  Home Medications   Current Outpatient Rx  Name  Route  Sig  Dispense  Refill  . ibuprofen (ADVIL,MOTRIN) 200 MG tablet   Oral   Take 200 mg by mouth every 4 (four) hours as needed for moderate pain.          Marland Kitchen levonorgestrel (MIRENA) 20 MCG/24HR IUD   Intrauterine   1 each by Intrauterine route once.        . fluticasone (FLONASE) 50 MCG/ACT nasal spray   Each Nare   Place 2 sprays into both nostrils daily.   16 g   0    Triage Vitals: Pulse 84  Temp(Src) 97.8 F (36.6 C) (Oral)  Resp 20  Ht 5\' 2"  (1.575 m)  Wt 142 lb 8 oz (64.638 kg)  BMI 26.06 kg/m2  SpO2 98%  Physical Exam  Nursing note and vitals reviewed. Constitutional: She is oriented to person, place, and time. She appears well-developed and well-nourished. No distress.  HENT:  Head: Normocephalic and atraumatic.  Mild oropharynx erythema without exudate, post-nasal drip, mucosal edema bilaterally, bilateral maxillary sinus tenderness   Eyes: EOM are normal.  Neck: Neck supple. No tracheal deviation present.  Cardiovascular: Normal rate and regular rhythm.   Pulmonary/Chest: Effort normal and breath sounds normal. No respiratory distress. She has no wheezes.  Musculoskeletal: Normal range of motion.  Lymphadenopathy:    She has cervical adenopathy (enlarged anterior bilateral cervical nodes).  Neurological: She is alert and oriented to person, place, and time.  Skin: Skin is warm and dry.  Psychiatric: She has a normal mood and affect. Her behavior is normal.    ED Course  Procedures (including critical care  time)  DIAGNOSTIC STUDIES: Oxygen Saturation is 98% on RA, normal by my interpretation.    COORDINATION OF CARE: 11:33 AM-Discussed treatment plan which includes strep test with pt at bedside and pt agreed to plan. If positive, then sxs are treated with antibiotics. If negative, then sxs are viral and treated with mucinex, flonase and other OTC medications.   Labs Review Labs Reviewed  RAPID STREP SCREEN  CULTURE, GROUP A STREP   Imaging Review No results found.  EKG Interpretation   None       MDM   1. URI (upper respiratory infection)   2. Sinusitis    Patient well-appearing, no apparent distress, afebrile with normal vital signs. Rapid strep negative. Discussed symptomatic treatment.  Patient is stable for discharge. Return precautions given. Patient states understanding of treatment care plan and is agreeable.   I personally performed the services described in this documentation, which was scribed in my presence. The recorded information has been reviewed and is accurate.    Illene Labrador, PA-C 02/05/14 1217

## 2014-02-07 LAB — CULTURE, GROUP A STREP

## 2014-02-19 ENCOUNTER — Inpatient Hospital Stay (HOSPITAL_COMMUNITY)
Admission: AD | Admit: 2014-02-19 | Discharge: 2014-02-19 | Disposition: A | Discharge status: Home / Self Care | Payer: Medicaid Other | Source: Ambulatory Visit | Attending: Family Medicine | Admitting: Family Medicine

## 2014-02-19 ENCOUNTER — Encounter (HOSPITAL_COMMUNITY): Payer: Self-pay | Admitting: *Deleted

## 2014-02-19 DIAGNOSIS — F17200 Nicotine dependence, unspecified, uncomplicated: Secondary | ICD-10-CM | POA: Insufficient documentation

## 2014-02-19 DIAGNOSIS — B373 Candidiasis of vulva and vagina: Secondary | ICD-10-CM

## 2014-02-19 DIAGNOSIS — Z880 Allergy status to penicillin: Secondary | ICD-10-CM | POA: Insufficient documentation

## 2014-02-19 DIAGNOSIS — B3731 Acute candidiasis of vulva and vagina: Secondary | ICD-10-CM | POA: Insufficient documentation

## 2014-02-19 LAB — URINALYSIS, ROUTINE W REFLEX MICROSCOPIC
BILIRUBIN URINE: NEGATIVE
Glucose, UA: NEGATIVE mg/dL
Ketones, ur: NEGATIVE mg/dL
Leukocytes, UA: NEGATIVE
Nitrite: NEGATIVE
PH: 5.5 (ref 5.0–8.0)
Protein, ur: NEGATIVE mg/dL
Urobilinogen, UA: 0.2 mg/dL (ref 0.0–1.0)

## 2014-02-19 LAB — URINE MICROSCOPIC-ADD ON

## 2014-02-19 LAB — POCT PREGNANCY, URINE: Preg Test, Ur: NEGATIVE

## 2014-02-19 LAB — WET PREP, GENITAL
Clue Cells Wet Prep HPF POC: NONE SEEN
TRICH WET PREP: NONE SEEN
YEAST WET PREP: NONE SEEN

## 2014-02-19 MED ORDER — FLUCONAZOLE 150 MG PO TABS
150.0000 mg | ORAL_TABLET | Freq: Once | ORAL | Status: AC
  Administered 2014-02-19: 150 mg via ORAL
  Filled 2014-02-19: qty 1

## 2014-02-19 NOTE — MAU Provider Note (Signed)
Attestation of Attending Supervision of Advanced Practitioner (PA/CNM/NP): Evaluation and management procedures were performed by the Advanced Practitioner under my supervision and collaboration.  I have reviewed the Advanced Practitioner's note and chart, and I agree with the management and plan.  Donnamae Jude, MD [facility] Attending 02/19/2014 12:42 PM

## 2014-02-19 NOTE — MAU Note (Signed)
Patient presents with complaints of abdominal and back pain X a couple of days.

## 2014-02-19 NOTE — Discharge Instructions (Signed)
Candidal Vulvovaginitis  Candidal vulvovaginitis is an infection of the vagina and vulva. The vulva is the skin around the opening of the vagina. This may cause itching and discomfort in and around the vagina.   HOME CARE  · Only take medicine as told by your doctor.  · Do not have sex (intercourse) until the infection is healed or as told by your doctor.  · Practice safe sex.  · Tell your sex partner about your infection.  · Do not douche or use tampons.  · Wear cotton underwear. Do not wear tight pants or panty hose.  · Eat yogurt. This may help treat and prevent yeast infections.  GET HELP RIGHT AWAY IF:   · You have a fever.  · Your problems get worse during treatment or do not get better in 3 days.  · You have discomfort, irritation, or itching in your vagina or vulva area.  · You have pain after sex.  · You start to get belly (abdominal) pain.  MAKE SURE YOU:  · Understand these instructions.  · Will watch your condition.  · Will get help right away if you are not doing well or get worse.  Document Released: 03/15/2009 Document Revised: 03/10/2012 Document Reviewed: 03/15/2009  [employer]® Patient Information ©2014 [employer].

## 2014-02-19 NOTE — MAU Provider Note (Signed)
History     CSN: [id number]  Arrival date and time: 02/19/14 1046   First Provider Initiated Contact with Patient 02/19/14 1111      Chief Complaint  Patient presents with  . Abdominal Pain  . Back Pain   HPI Ms. Ruth Daniels is a 18 y.o. G1P1001 who presents to MAU today with complaint of LLQ pain x 2 days. The patient denies vaginal bleeding, disharge, fever, UTI symptoms or constipation. She has had occasional nausea and 2 episodes of loose stools over the past 2 days. She rates her pain at 8.5/10 now, but appears very comfortable.   OB History   Grav Para Term Preterm Abortions TAB SAB Ect Mult Living   1 1 1       1       Past Medical History  Diagnosis Date  . Allergy     Penicillin  . Eczema   . Chlamydia trachomatis infection in pregnancy   . GBS (group B streptococcus) UTI complicating pregnancy 5/95/6387    Past Surgical History  Procedure Laterality Date  . Foot surgery      Family History  Problem Relation Age of Onset  . Depression Mother     History  Substance Use Topics  . Smoking status: Current Every Day Smoker -- 0.25 packs/day    Types: Cigarettes  . Smokeless tobacco: Never Used  . Alcohol Use: No    Allergies:  Allergies  Allergen Reactions  . Penicillins Other (See Comments)    Rash, fever   . Latex Rash    Prescriptions prior to admission  Medication Sig Dispense Refill  . acetaminophen (TYLENOL) 325 MG tablet Take 650 mg by mouth every 6 (six) hours as needed for mild pain or headache.      . ibuprofen (ADVIL,MOTRIN) 200 MG tablet Take 400 mg by mouth every 4 (four) hours as needed for headache or moderate pain.       Marland Kitchen levonorgestrel (MIRENA) 20 MCG/24HR IUD 1 each by Intrauterine route once.        Review of Systems  Constitutional: Negative for fever and malaise/fatigue.  Gastrointestinal: Positive for nausea, abdominal pain and diarrhea. Negative for vomiting and constipation.  Genitourinary: Negative for dysuria,  urgency and frequency.       Neg - vaginal bleeding, discharge   Physical Exam   Blood pressure 113/84, pulse 69, temperature 97.6 F (36.4 C), temperature source Oral, resp. rate 16, height 5\' 1"  (1.549 m), weight 143 lb 6 oz (65.034 kg), last menstrual period 08/19/2013.  Physical Exam  Constitutional: She is oriented to person, place, and time. She appears well-developed and well-nourished. No distress.  Patient appears very comfortable  HENT:  Head: Normocephalic and atraumatic.  Cardiovascular: Normal rate.   Respiratory: Effort normal.  GI: Soft. Bowel sounds are normal. She exhibits no distension and no mass. There is tenderness (very mild tenderness to palpation of the LUQ and LLQ). There is no rebound and no guarding.  Genitourinary: Uterus is tender (mild). Uterus is not enlarged. Cervix exhibits no motion tenderness, no discharge and no friability. Right adnexum displays tenderness (mild). Right adnexum displays no mass. Left adnexum displays no mass and no tenderness. No bleeding around the vagina. Vaginal discharge (small amount of thick, clumpy white discharge noted) found.  Neurological: She is alert and oriented to person, place, and time.  Skin: Skin is warm and dry. No erythema.  Psychiatric: She has a normal mood and affect.   Results for orders placed  during the hospital encounter of 02/19/14 (from the past 24 hour(s))  URINALYSIS, ROUTINE W REFLEX MICROSCOPIC     Status: Abnormal   Collection Time    02/19/14 10:53 AM      Result Value Ref Range   Color, Urine YELLOW  YELLOW   APPearance CLEAR  CLEAR   Specific Gravity, Urine >1.030 (*) 1.005 - 1.030   pH 5.5  5.0 - 8.0   Glucose, UA NEGATIVE  NEGATIVE mg/dL   Hgb urine dipstick TRACE (*) NEGATIVE   Bilirubin Urine NEGATIVE  NEGATIVE   Ketones, ur NEGATIVE  NEGATIVE mg/dL   Protein, ur NEGATIVE  NEGATIVE mg/dL   Urobilinogen, UA 0.2  0.0 - 1.0 mg/dL   Nitrite NEGATIVE  NEGATIVE   Leukocytes, UA NEGATIVE   NEGATIVE  URINE MICROSCOPIC-ADD ON     Status: None   Collection Time    02/19/14 10:53 AM      Result Value Ref Range   Squamous Epithelial / LPF RARE  RARE   RBC / HPF 0-2  <3 RBC/hpf  POCT PREGNANCY, URINE     Status: None   Collection Time    02/19/14 11:05 AM      Result Value Ref Range   Preg Test, Ur NEGATIVE  NEGATIVE  WET PREP, GENITAL     Status: Abnormal   Collection Time    02/19/14 11:23 AM      Result Value Ref Range   Yeast Wet Prep HPF POC NONE SEEN  NONE SEEN   Trich, Wet Prep NONE SEEN  NONE SEEN   Clue Cells Wet Prep HPF POC NONE SEEN  NONE SEEN   WBC, Wet Prep HPF POC FEW (*) NONE SEEN    MAU Course  Procedures None  MDM UPT - negative UA, wet prep, GC/Chlamydia today 150 mg Diflucan given in MAU today Assessment and Plan  A: Yeast vulvovaginitis, clinical  P: Discharge home Patient treated in MAU Patient advised to follow-up with [facility] clinic PRN or if symptoms persist Patient may return to MAU as needed or if her condition were to change or worsen  Farris Has, PA-C  02/19/2014, 12:07 PM

## 2014-02-20 LAB — GC/CHLAMYDIA PROBE AMP
CT Probe RNA: NEGATIVE
GC Probe RNA: NEGATIVE

## 2014-03-17 ENCOUNTER — Telehealth: Payer: Self-pay | Admitting: Obstetrics & Gynecology

## 2014-03-17 ENCOUNTER — Ambulatory Visit: Payer: Self-pay | Admitting: Obstetrics & Gynecology

## 2014-03-17 NOTE — Telephone Encounter (Signed)
Called patient to inform her about needing to reschedule her appointment due to no provider being in the office at the time of her appointment. Her phone is off, and was unable to leave a message about this.

## 2014-03-18 ENCOUNTER — Emergency Department (HOSPITAL_COMMUNITY)
Admission: EM | Admit: 2014-03-18 | Discharge: 2014-03-18 | Disposition: A | Discharge status: Home / Self Care | Payer: Medicaid Other | Attending: Emergency Medicine | Admitting: Emergency Medicine

## 2014-03-18 ENCOUNTER — Encounter (HOSPITAL_COMMUNITY): Payer: Self-pay | Admitting: Emergency Medicine

## 2014-03-18 DIAGNOSIS — R1031 Right lower quadrant pain: Secondary | ICD-10-CM | POA: Insufficient documentation

## 2014-03-18 DIAGNOSIS — Z91040 Latex allergy status: Secondary | ICD-10-CM | POA: Insufficient documentation

## 2014-03-18 DIAGNOSIS — R109 Unspecified abdominal pain: Secondary | ICD-10-CM

## 2014-03-18 DIAGNOSIS — J029 Acute pharyngitis, unspecified: Secondary | ICD-10-CM | POA: Insufficient documentation

## 2014-03-18 DIAGNOSIS — Z87440 Personal history of urinary (tract) infections: Secondary | ICD-10-CM | POA: Insufficient documentation

## 2014-03-18 DIAGNOSIS — Z872 Personal history of diseases of the skin and subcutaneous tissue: Secondary | ICD-10-CM | POA: Insufficient documentation

## 2014-03-18 DIAGNOSIS — Z8619 Personal history of other infectious and parasitic diseases: Secondary | ICD-10-CM | POA: Insufficient documentation

## 2014-03-18 DIAGNOSIS — R52 Pain, unspecified: Secondary | ICD-10-CM | POA: Insufficient documentation

## 2014-03-18 DIAGNOSIS — N898 Other specified noninflammatory disorders of vagina: Secondary | ICD-10-CM | POA: Insufficient documentation

## 2014-03-18 DIAGNOSIS — Z880 Allergy status to penicillin: Secondary | ICD-10-CM | POA: Insufficient documentation

## 2014-03-18 DIAGNOSIS — R1032 Left lower quadrant pain: Secondary | ICD-10-CM | POA: Insufficient documentation

## 2014-03-18 DIAGNOSIS — F17200 Nicotine dependence, unspecified, uncomplicated: Secondary | ICD-10-CM | POA: Insufficient documentation

## 2014-03-18 LAB — CBC WITH DIFFERENTIAL/PLATELET
BASOS ABS: 0 10*3/uL (ref 0.0–0.1)
BASOS PCT: 1 % (ref 0–1)
EOS ABS: 0 10*3/uL (ref 0.0–0.7)
EOS PCT: 1 % (ref 0–5)
HEMATOCRIT: 37.6 % (ref 36.0–46.0)
Hemoglobin: 13 g/dL (ref 12.0–15.0)
LYMPHS PCT: 37 % (ref 12–46)
Lymphs Abs: 2.2 10*3/uL (ref 0.7–4.0)
MCH: 31.5 pg (ref 26.0–34.0)
MCHC: 34.6 g/dL (ref 30.0–36.0)
MCV: 91 fL (ref 78.0–100.0)
MONO ABS: 0.4 10*3/uL (ref 0.1–1.0)
Monocytes Relative: 6 % (ref 3–12)
Neutro Abs: 3.4 10*3/uL (ref 1.7–7.7)
Neutrophils Relative %: 56 % (ref 43–77)
PLATELETS: 263 10*3/uL (ref 150–400)
RBC: 4.13 MIL/uL (ref 3.87–5.11)
RDW: 13 % (ref 11.5–15.5)
WBC: 6 10*3/uL (ref 4.0–10.5)

## 2014-03-18 LAB — COMPREHENSIVE METABOLIC PANEL
ALK PHOS: 81 U/L (ref 39–117)
ALT: 9 U/L (ref 0–35)
AST: 14 U/L (ref 0–37)
Albumin: 3.7 g/dL (ref 3.5–5.2)
BILIRUBIN TOTAL: 0.3 mg/dL (ref 0.3–1.2)
BUN: 8 mg/dL (ref 6–23)
CALCIUM: 9 mg/dL (ref 8.4–10.5)
CO2: 26 mEq/L (ref 19–32)
CREATININE: 0.56 mg/dL (ref 0.50–1.10)
Chloride: 100 mEq/L (ref 96–112)
GFR calc Af Amer: >90 mL/min (ref >90)
GFR calc non Af Amer: >90 mL/min (ref >90)
Glucose, Bld: 87 mg/dL (ref 70–99)
Potassium: 4.2 mEq/L (ref 3.7–5.3)
Sodium: 138 mEq/L (ref 137–147)
TOTAL PROTEIN: 6.7 g/dL (ref 6.0–8.3)

## 2014-03-18 LAB — URINALYSIS, ROUTINE W REFLEX MICROSCOPIC
BILIRUBIN URINE: NEGATIVE
Glucose, UA: NEGATIVE mg/dL
HGB URINE DIPSTICK: NEGATIVE
KETONES UR: NEGATIVE mg/dL
Leukocytes, UA: NEGATIVE
NITRITE: NEGATIVE
PH: 7 (ref 5.0–8.0)
Protein, ur: NEGATIVE mg/dL
Specific Gravity, Urine: 1.021 (ref 1.005–1.030)
Urobilinogen, UA: 0.2 mg/dL (ref 0.0–1.0)

## 2014-03-18 LAB — WET PREP, GENITAL
Clue Cells Wet Prep HPF POC: NONE SEEN
Trich, Wet Prep: NONE SEEN
YEAST WET PREP: NONE SEEN

## 2014-03-18 LAB — POC URINE PREG, ED: PREG TEST UR: NEGATIVE

## 2014-03-18 MED ORDER — HYDROCODONE-ACETAMINOPHEN 5-325 MG PO TABS: 1.0000 | ORAL_TABLET | ORAL | Status: DC | PRN

## 2014-03-18 NOTE — ED Notes (Signed)
Pt presents with multiple complaints - body aches, sore throat, pelvic pain and nausea.  Denies vaginal discharge, denies changes in bowel or bladder habits.  Pt was scheduled to have her mirena removed, but it was rescheduled.

## 2014-03-18 NOTE — Discharge Instructions (Signed)
Make follow up appointment with [facility] clinic within 2 days for further evaluation of symptoms should they persist. Take pain medication for any breakthrough pain. Return to Emergency department if you develop any worsening symptoms, fever/chills, Nausea/vomiting, or vaginal bleeding.    Abdominal Pain, Women Abdominal (stomach, pelvic, or belly) pain can be caused by many things. It is important to tell your doctor:  The location of the pain.  Does it come and go or is it present all the time?  Are there things that start the pain (eating certain foods, exercise)?  Are there other symptoms associated with the pain (fever, nausea, vomiting, diarrhea)? All of this is helpful to know when trying to find the cause of the pain. CAUSES   Stomach: virus or bacteria infection, or ulcer.  Intestine: appendicitis (inflamed appendix), regional ileitis (Crohn's disease), ulcerative colitis (inflamed colon), irritable bowel syndrome, diverticulitis (inflamed diverticulum of the colon), or cancer of the stomach or intestine.  Gallbladder disease or stones in the gallbladder.  Kidney disease, kidney stones, or infection.  Pancreas infection or cancer.  Fibromyalgia (pain disorder).  Diseases of the female organs:  Uterus: fibroid (non-cancerous) tumors or infection.  Fallopian tubes: infection or tubal pregnancy.  Ovary: cysts or tumors.  Pelvic adhesions (scar tissue).  Endometriosis (uterus lining tissue growing in the pelvis and on the pelvic organs).  Pelvic congestion syndrome (female organs filling up with blood just before the menstrual period).  Pain with the menstrual period.  Pain with ovulation (producing an egg).  Pain with an IUD (intrauterine device, birth control) in the uterus.  Cancer of the female organs.  Functional pain (pain not caused by a disease, may improve without treatment).  Psychological pain.  Depression. DIAGNOSIS  Your doctor will  decide the seriousness of your pain by doing an examination.  Blood tests.  X-rays.  Ultrasound.  CT scan (computed tomography, special type of X-ray).  MRI (magnetic resonance imaging).  Cultures, for infection.  Barium enema (dye inserted in the large intestine, to better view it with X-rays).  Colonoscopy (looking in intestine with a lighted tube).  Laparoscopy (minor surgery, looking in abdomen with a lighted tube).  Major abdominal exploratory surgery (looking in abdomen with a large incision). TREATMENT  The treatment will depend on the cause of the pain.   Many cases can be observed and treated at home.  Over-the-counter medicines recommended by your caregiver.  Prescription medicine.  Antibiotics, for infection.  Birth control pills, for painful periods or for ovulation pain.  Hormone treatment, for endometriosis.  Nerve blocking injections.  Physical therapy.  Antidepressants.  Counseling with a psychologist or psychiatrist.  Minor or major surgery. HOME CARE INSTRUCTIONS   Do not take laxatives, unless directed by your caregiver.  Take over-the-counter pain medicine only if ordered by your caregiver. Do not take aspirin because it can cause an upset stomach or bleeding.  Try a clear liquid diet (broth or water) as ordered by your caregiver. Slowly move to a bland diet, as tolerated, if the pain is related to the stomach or intestine.  Have a thermometer and take your temperature several times a day, and record it.  Bed rest and sleep, if it helps the pain.  Avoid sexual intercourse, if it causes pain.  Avoid stressful situations.  Keep your follow-up appointments and tests, as your caregiver orders.  If the pain does not go away with medicine or surgery, you may try:  Acupuncture.  Relaxation exercises (yoga, meditation).  Group therapy.  Counseling. SEEK MEDICAL CARE IF:   You notice certain foods cause stomach pain.  Your home  care treatment is not helping your pain.  You need stronger pain medicine.  You want your IUD removed.  You feel faint or lightheaded.  You develop nausea and vomiting.  You develop a rash.  You are having side effects or an allergy to your medicine. SEEK IMMEDIATE MEDICAL CARE IF:   Your pain does not go away or gets worse.  You have a fever.  Your pain is felt only in portions of the abdomen. The right side could possibly be appendicitis. The left lower portion of the abdomen could be colitis or diverticulitis.  You are passing blood in your stools (bright red or black tarry stools, with or without vomiting).  You have blood in your urine.  You develop chills, with or without a fever.  You pass out. MAKE SURE YOU:   Understand these instructions.  Will watch your condition.  Will get help right away if you are not doing well or get worse. Document Released: 10/14/2007 Document Revised: 03/10/2012 Document Reviewed: 11/03/2009 [facility] Patient Information 2014 [place], [place].

## 2014-03-18 NOTE — ED Provider Notes (Signed)
CSN: [id number]     Arrival date & time 03/18/14  1043 History   First MD Initiated Contact with Patient 03/18/14 1137     Chief Complaint  Patient presents with  . Sore Throat  . Generalized Body Aches  . Abdominal Pain     (Consider location/radiation/quality/duration/timing/severity/associated sxs/prior Treatment) Patient is a 18 y.o. female presenting with pharyngitis and abdominal pain.  Sore Throat Associated symptoms include abdominal pain and a sore throat.  Abdominal Pain Associated symptoms: sore throat    18 yo female presents with lower abdominal pain x 1 week. Pain gradual in onset and worsening. Pain described as "tight, sharp" intermittent pain that is rated at 10/10. Patient appears to be comfortable in bed, in NAD. Patient admits to N/V x 2 episodes (once today and once 2 days ago). Admits bilateral flank pain. Denies fever/chills, CP, SOB, upper back or neck pain. Denies diarrhea, constipation, bloody stools, or any urinary sxs. Denies vaginal pain or discharge. Admits to occasional pain with intercourse. Has not had menstrual cycle since giving birth on 08/19/2013. Patient admits to IUD use (Mirena) x 2 months.  PMH significant for STI (Chlamydia). Denies any abdominal surgeries. Denies any hx of kidney stones. d  Past Medical History  Diagnosis Date  . Allergy     Penicillin  . Eczema   . Chlamydia trachomatis infection in pregnancy   . GBS (group B streptococcus) UTI complicating pregnancy 1/88/4166   Past Surgical History  Procedure Laterality Date  . Foot surgery     Family History  Problem Relation Age of Onset  . Depression Mother    History  Substance Use Topics  . Smoking status: Current Every Day Smoker -- 0.25 packs/day    Types: Cigarettes  . Smokeless tobacco: Never Used  . Alcohol Use: No   OB History   Grav Para Term Preterm Abortions TAB SAB Ect Mult Living   1 1 1       1      Review of Systems  HENT: Positive for sore throat.    Gastrointestinal: Positive for abdominal pain.  All other systems reviewed and are negative.      Allergies  Penicillins and Latex  Home Medications   Current Outpatient Rx  Name  Route  Sig  Dispense  Refill  . acetaminophen (TYLENOL) 325 MG tablet   Oral   Take 650 mg by mouth every 6 (six) hours as needed for mild pain or headache.         . ibuprofen (ADVIL,MOTRIN) 200 MG tablet   Oral   Take 400 mg by mouth every 4 (four) hours as needed for headache or moderate pain.          Marland Kitchen HYDROcodone-acetaminophen (NORCO/VICODIN) 5-325 MG per tablet   Oral   Take 1 tablet by mouth every 4 (four) hours as needed.   6 tablet   0   . levonorgestrel (MIRENA) 20 MCG/24HR IUD   Intrauterine   1 each by Intrauterine route once.          BP 96/51  Pulse 59  Temp(Src) 97.9 F (36.6 C) (Oral)  Resp 16  Ht 5\' 2"  (1.575 m)  SpO2 100%  LMP 08/19/2013 Physical Exam  Nursing note and vitals reviewed. Constitutional: She is oriented to person, place, and time. She appears well-developed and well-nourished. No distress.  HENT:  Head: Normocephalic and atraumatic.  Right Ear: Tympanic membrane and ear canal normal.  Left Ear: Tympanic membrane and ear  canal normal.  Nose: Nose normal. Right sinus exhibits no maxillary sinus tenderness and no frontal sinus tenderness. Left sinus exhibits no maxillary sinus tenderness and no frontal sinus tenderness.  Mouth/Throat: Uvula is midline, oropharynx is clear and moist and mucous membranes are normal. No oropharyngeal exudate, posterior oropharyngeal edema or posterior oropharyngeal erythema.  Eyes: Conjunctivae are normal. Right eye exhibits no discharge. Left eye exhibits no discharge. No scleral icterus.  Neck: Normal range of motion, full passive range of motion without pain and phonation normal. Neck supple. No JVD present. No spinous process tenderness and no muscular tenderness present. No rigidity. No tracheal deviation, no edema,  no erythema and normal range of motion present.  Cardiovascular: Normal rate and regular rhythm.  Exam reveals no gallop and no friction rub.   No murmur heard. Pulmonary/Chest: Effort normal and breath sounds normal. No stridor. No respiratory distress. She has no wheezes. She has no rhonchi. She has no rales.  Abdominal: Soft. Normal appearance and bowel sounds are normal. She exhibits no distension. There is no hepatosplenomegaly. There is tenderness in the right lower quadrant, suprapubic area and left lower quadrant. There is CVA tenderness (RIGHT). There is no rigidity, no rebound, no guarding, no tenderness at McBurney's point and negative Murphy's sign.  Genitourinary: Vagina normal. Pelvic exam was performed with patient supine. There is no rash, tenderness or lesion on the right labia. There is no rash, tenderness or lesion on the left labia. Cervix exhibits discharge (thin white discharge) and friability. Cervix exhibits no motion tenderness. Right adnexum displays no mass, no tenderness and no fullness. Left adnexum displays no mass, no tenderness and no fullness.  Musculoskeletal: Normal range of motion. She exhibits no edema.  Lymphadenopathy:    She has no cervical adenopathy.  Neurological: She is alert and oriented to person, place, and time.  Skin: Skin is warm and dry. She is not diaphoretic.  Psychiatric: She has a normal mood and affect. Her behavior is normal.    ED Course  Procedures (including critical care time) Labs Review Labs Reviewed  WET PREP, GENITAL - Abnormal; Notable for the following:    WBC, Wet Prep HPF POC FEW (*)    All other components within normal limits  GC/CHLAMYDIA PROBE AMP  CBC WITH DIFFERENTIAL  COMPREHENSIVE METABOLIC PANEL  URINALYSIS, ROUTINE W REFLEX MICROSCOPIC  POC URINE PREG, ED   Imaging Review No results found.   EKG Interpretation None      MDM   Final diagnoses:  Abdominal pain  Sore throat  Body aches  Patient  afebrile with normal VS.  No indication for Rapid strep at this time according to [facility] criteria.  Urine preg negative, doubt ectopic.  UA negative for UTI.  CMP is WNL CBC is WNL  Wet prep shows few WBC, non-specific.  G/C pending.  Pelvic exam not consistent with PID, TOA, or Torsion.  Patient appears in no acute distress.  Physical exam reveals soft non-surgical abdomen at this time. Patient tolerating POs in room, states she is ready to go home.   Suspicion for pain related to IUD, patient states she want it taken out. Plan to have patient follow up with [facility] outpatient clinic in 2 days for further evaluation and management of contraception. Patient agrees with plan. Discharged in good condition   Meds given in ED:  Medications - No data to display  Discharge Medication List as of 03/18/2014  3:32 PM      HYDROcodone-acetaminophen (NORCO/VICODIN) 5-325 MG  per tablet   Oral   Take 1 tablet by mouth every 4 (four) hours as needed.   6 tablet   0       Dossie Arbour [place], [place] 03/18/14 2145

## 2014-03-19 LAB — GC/CHLAMYDIA PROBE AMP
CT Probe RNA: NEGATIVE
GC PROBE AMP APTIMA: NEGATIVE

## 2014-03-22 NOTE — ED Provider Notes (Signed)
Medical screening examination/treatment/procedure(s) were performed by non-physician practitioner and as supervising physician I was immediately available for consultation/collaboration.   EKG Interpretation None       Jasper Riling. Alvino Chapel, MD 03/22/14 0730

## 2014-04-01 ENCOUNTER — Ambulatory Visit (INDEPENDENT_AMBULATORY_CARE_PROVIDER_SITE_OTHER): Payer: Medicaid Other | Admitting: Obstetrics & Gynecology

## 2014-04-01 ENCOUNTER — Encounter: Payer: Self-pay | Admitting: Obstetrics & Gynecology

## 2014-04-01 VITALS — BP 116/67 | HR 59 | Ht 62.0 in | Wt 142.2 lb

## 2014-04-01 DIAGNOSIS — Z30431 Encounter for routine checking of intrauterine contraceptive device: Secondary | ICD-10-CM | POA: Insufficient documentation

## 2014-04-01 NOTE — Patient Instructions (Signed)
Levonorgestrel intrauterine device (IUD) What is this medicine? LEVONORGESTREL IUD (LEE voe nor jes trel) is a contraceptive (birth control) device. The device is placed inside the uterus by a healthcare professional. It is used to prevent pregnancy and can also be used to treat heavy bleeding that occurs during your period. Depending on the device, it can be used for 3 to 5 years. This medicine may be used for other purposes; ask your health care provider or pharmacist if you have questions. COMMON BRAND NAME(S): Mirena, Skyla What should I tell my health care provider before I take this medicine? They need to know if you have any of these conditions: -abnormal Pap smear -cancer of the breast, uterus, or cervix -diabetes -endometritis -genital or pelvic infection now or in the past -have more than one sexual partner or your partner has more than one partner -heart disease -history of an ectopic or tubal pregnancy -immune system problems -IUD in place -liver disease or tumor -problems with blood clots or take blood-thinners -use intravenous drugs -uterus of unusual shape -vaginal bleeding that has not been explained -an unusual or allergic reaction to levonorgestrel, other hormones, silicone, or polyethylene, medicines, foods, dyes, or preservatives -pregnant or trying to get pregnant -breast-feeding How should I use this medicine? This device is placed inside the uterus by a health care professional. Talk to your pediatrician regarding the use of this medicine in children. Special care may be needed. Overdosage: If you think you have taken too much of this medicine contact a poison control center or emergency room at once. NOTE: This medicine is only for you. Do not share this medicine with others. What if I miss a dose? This does not apply. What may interact with this medicine? Do not take this medicine with any of the following  medications: -amprenavir -bosentan -fosamprenavir This medicine may also interact with the following medications: -aprepitant -barbiturate medicines for inducing sleep or treating seizures -bexarotene -griseofulvin -medicines to treat seizures like carbamazepine, ethotoin, felbamate, oxcarbazepine, phenytoin, topiramate -modafinil -pioglitazone -rifabutin -rifampin -rifapentine -some medicines to treat HIV infection like atazanavir, indinavir, lopinavir, nelfinavir, tipranavir, ritonavir -St. John's wort -warfarin This list may not describe all possible interactions. Give your health care provider a list of all the medicines, herbs, non-prescription drugs, or dietary supplements you use. Also tell them if you smoke, drink alcohol, or use illegal drugs. Some items may interact with your medicine. What should I watch for while using this medicine? Visit your doctor or health care professional for regular check ups. See your doctor if you or your partner has sexual contact with others, becomes HIV positive, or gets a sexual transmitted disease. This product does not protect you against HIV infection (AIDS) or other sexually transmitted diseases. You can check the placement of the IUD yourself by reaching up to the top of your vagina with clean fingers to feel the threads. Do not pull on the threads. It is a good habit to check placement after each menstrual period. Call your doctor right away if you feel more of the IUD than just the threads or if you cannot feel the threads at all. The IUD may come out by itself. You may become pregnant if the device comes out. If you notice that the IUD has come out use a backup birth control method like condoms and call your health care provider. Using tampons will not change the position of the IUD and are okay to use during your period. What side effects may I   notice from receiving this medicine? Side effects that you should report to your doctor or  health care professional as soon as possible: -allergic reactions like skin rash, itching or hives, swelling of the face, lips, or tongue -fever, flu-like symptoms -genital sores -high blood pressure -no menstrual period for 6 weeks during use -pain, swelling, warmth in the leg -pelvic pain or tenderness -severe or sudden headache -signs of pregnancy -stomach cramping -sudden shortness of breath -trouble with balance, talking, or walking -unusual vaginal bleeding, discharge -yellowing of the eyes or skin Side effects that usually do not require medical attention (report to your doctor or health care professional if they continue or are bothersome): -acne -breast pain -change in sex drive or performance -changes in weight -cramping, dizziness, or faintness while the device is being inserted -headache -irregular menstrual bleeding within first 3 to 6 months of use -nausea This list may not describe all possible side effects. Call your doctor for medical advice about side effects. You may report side effects to FDA at 1-800-FDA-1088. Where should I keep my medicine? This does not apply. NOTE: This sheet is a summary. It may not cover all possible information. If you have questions about this medicine, talk to your doctor, pharmacist, or health care provider.  2014, [employer]. (2012-01-17 13:54:04)  

## 2014-04-01 NOTE — Progress Notes (Signed)
Patient would like to have her IUD removed today. She states that she feels uncomfortable with it and just doesn't like it. Her partner also reports being poked by the strings. I asked patient if we could trim strings would she consider keeping it and she stated no that she just wants it out. She may be interested in the nuva ring.

## 2014-04-01 NOTE — Progress Notes (Signed)
Patient ID: Ruth Daniels, female   DOB: 1996/12/11, 18 y.o.   MRN: [id number]  Chief Complaint  Patient presents with  . Contraception    HPI Ruth Daniels is a 18 y.o. female.  G1P1001 No LMP recorded. Patient is not currently having periods (Reason: IUD).  Bleeding today. Partner feels her strings and she wonders if IUD is misplaced and should be removed. HPI  Past Medical History  Diagnosis Date  . Allergy     Penicillin  . Eczema   . Chlamydia trachomatis infection in pregnancy   . GBS (group B streptococcus) UTI complicating pregnancy 9/73/5329    Past Surgical History  Procedure Laterality Date  . Foot surgery      Family History  Problem Relation Age of Onset  . Depression Mother     Social History History  Substance Use Topics  . Smoking status: Current Every Day Smoker -- 0.25 packs/day    Types: Cigarettes  . Smokeless tobacco: Never Used  . Alcohol Use: No    Allergies  Allergen Reactions  . Penicillins Other (See Comments)    Rash, fever   . Latex Rash    Current Outpatient Prescriptions  Medication Sig Dispense Refill  . levonorgestrel (MIRENA) 20 MCG/24HR IUD 1 each by Intrauterine route once.      Marland Kitchen acetaminophen (TYLENOL) 325 MG tablet Take 650 mg by mouth every 6 (six) hours as needed for mild pain or headache.      Marland Kitchen HYDROcodone-acetaminophen (NORCO/VICODIN) 5-325 MG per tablet Take 1 tablet by mouth every 4 (four) hours as needed.  6 tablet  0  . ibuprofen (ADVIL,MOTRIN) 200 MG tablet Take 400 mg by mouth every 4 (four) hours as needed for headache or moderate pain.        No current facility-administered medications for this visit.    Review of Systems Review of Systems  Constitutional: Negative for fever.  Genitourinary: Positive for vaginal bleeding. Negative for vaginal discharge, menstrual problem and pelvic pain.    Blood pressure 116/67, pulse 59, height 5\' 2"  (1.575 m), weight 142 lb 3.2 oz (64.501 kg), not currently  breastfeeding.  Physical Exam Physical Exam  Constitutional: She is oriented to person, place, and time. She appears well-developed. No distress.  Pulmonary/Chest: Effort normal. No respiratory distress.  Genitourinary: Vagina normal and uterus normal.  Blood in vault. Strings 2.5 cm, trimmed per her request  Neurological: She is alert and oriented to person, place, and time.  Skin: Skin is warm and dry.  Psychiatric: She has a normal mood and affect. Her behavior is normal.    Data Reviewed Delivery note  Assessment    Sx  With Mirena but no complication      Plan    Report if still having difficulty, o/w reassured and string trimmed.        ARNOLD,JAMES 04/01/2014, 1:23 PM

## 2014-05-11 ENCOUNTER — Emergency Department (HOSPITAL_COMMUNITY)
Admission: EM | Admit: 2014-05-11 | Discharge: 2014-05-11 | Disposition: A | Discharge status: Home / Self Care | Payer: Medicaid Other | Attending: Emergency Medicine | Admitting: Emergency Medicine

## 2014-05-11 ENCOUNTER — Emergency Department (HOSPITAL_COMMUNITY): Payer: Medicaid Other

## 2014-05-11 ENCOUNTER — Encounter (HOSPITAL_COMMUNITY): Payer: Self-pay | Admitting: Emergency Medicine

## 2014-05-11 DIAGNOSIS — F17200 Nicotine dependence, unspecified, uncomplicated: Secondary | ICD-10-CM | POA: Insufficient documentation

## 2014-05-11 DIAGNOSIS — F911 Conduct disorder, childhood-onset type: Secondary | ICD-10-CM | POA: Insufficient documentation

## 2014-05-11 DIAGNOSIS — R109 Unspecified abdominal pain: Secondary | ICD-10-CM | POA: Insufficient documentation

## 2014-05-11 DIAGNOSIS — Z872 Personal history of diseases of the skin and subcutaneous tissue: Secondary | ICD-10-CM | POA: Insufficient documentation

## 2014-05-11 DIAGNOSIS — Z8619 Personal history of other infectious and parasitic diseases: Secondary | ICD-10-CM | POA: Insufficient documentation

## 2014-05-11 DIAGNOSIS — Z91040 Latex allergy status: Secondary | ICD-10-CM | POA: Insufficient documentation

## 2014-05-11 DIAGNOSIS — Z8709 Personal history of other diseases of the respiratory system: Secondary | ICD-10-CM | POA: Insufficient documentation

## 2014-05-11 DIAGNOSIS — R0789 Other chest pain: Secondary | ICD-10-CM

## 2014-05-11 DIAGNOSIS — Z880 Allergy status to penicillin: Secondary | ICD-10-CM | POA: Insufficient documentation

## 2014-05-11 DIAGNOSIS — R0602 Shortness of breath: Secondary | ICD-10-CM | POA: Insufficient documentation

## 2014-05-11 DIAGNOSIS — Z3202 Encounter for pregnancy test, result negative: Secondary | ICD-10-CM | POA: Insufficient documentation

## 2014-05-11 DIAGNOSIS — R1032 Left lower quadrant pain: Secondary | ICD-10-CM | POA: Insufficient documentation

## 2014-05-11 DIAGNOSIS — R071 Chest pain on breathing: Secondary | ICD-10-CM | POA: Insufficient documentation

## 2014-05-11 DIAGNOSIS — Z87440 Personal history of urinary (tract) infections: Secondary | ICD-10-CM | POA: Insufficient documentation

## 2014-05-11 LAB — CBC
HCT: 35.5 % — ABNORMAL LOW (ref 36.0–46.0)
Hemoglobin: 12.4 g/dL (ref 12.0–15.0)
MCH: 31.6 pg (ref 26.0–34.0)
MCHC: 34.9 g/dL (ref 30.0–36.0)
MCV: 90.3 fL (ref 78.0–100.0)
PLATELETS: 231 10*3/uL (ref 150–400)
RBC: 3.93 MIL/uL (ref 3.87–5.11)
RDW: 12.7 % (ref 11.5–15.5)
WBC: 6 10*3/uL (ref 4.0–10.5)

## 2014-05-11 LAB — BASIC METABOLIC PANEL
BUN: 10 mg/dL (ref 6–23)
CALCIUM: 8.7 mg/dL (ref 8.4–10.5)
CHLORIDE: 107 meq/L (ref 96–112)
CO2: 24 meq/L (ref 19–32)
Creatinine, Ser: 0.6 mg/dL (ref 0.50–1.10)
GFR calc non Af Amer: >90 mL/min (ref >90)
Glucose, Bld: 93 mg/dL (ref 70–99)
Potassium: 4 mEq/L (ref 3.7–5.3)
SODIUM: 142 meq/L (ref 137–147)

## 2014-05-11 LAB — URINALYSIS, ROUTINE W REFLEX MICROSCOPIC
Bilirubin Urine: NEGATIVE
Glucose, UA: NEGATIVE mg/dL
Hgb urine dipstick: NEGATIVE
Ketones, ur: NEGATIVE mg/dL
Leukocytes, UA: NEGATIVE
Nitrite: NEGATIVE
PROTEIN: NEGATIVE mg/dL
Specific Gravity, Urine: 1.025 (ref 1.005–1.030)
UROBILINOGEN UA: 0.2 mg/dL (ref 0.0–1.0)
pH: 8.5 — ABNORMAL HIGH (ref 5.0–8.0)

## 2014-05-11 LAB — I-STAT TROPONIN, ED: TROPONIN I, POC: 0 ng/mL (ref 0.00–0.08)

## 2014-05-11 LAB — POC URINE PREG, ED: Preg Test, Ur: NEGATIVE

## 2014-05-11 MED ORDER — IBUPROFEN 800 MG PO TABS: 800.0000 mg | ORAL_TABLET | Freq: Three times a day (TID) | ORAL | Status: DC | PRN

## 2014-05-11 MED ORDER — KETOROLAC TROMETHAMINE 60 MG/2ML IM SOLN
60.0000 mg | Freq: Once | INTRAMUSCULAR | Status: AC
  Administered 2014-05-11: 60 mg via INTRAMUSCULAR
  Filled 2014-05-11: qty 2

## 2014-05-11 NOTE — ED Notes (Signed)
No answer x2 
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
## 2014-05-11 NOTE — ED Notes (Signed)
PT states recent uri-- cough some blood at times
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
## 2014-05-11 NOTE — ED Notes (Signed)
Pt is here with intermittent midsternal chest pain for about one month.  Reports pain radiates to lower back.  Pt reports sob
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
## 2014-05-11 NOTE — Discharge Instructions (Signed)
Read the information below.  Use the prescribed medication as directed.  Please discuss all new medications with your pharmacist.  You may return to the Emergency Department at any time for worsening condition or any new symptoms that concern you.    You have been diagnosed by your caregiver as having chest wall pain. SEEK IMMEDIATE MEDICAL ATTENTION IF: You develop a fever.  Your chest pains become severe or intolerable.  You develop new, unexplained symptoms (problems).  You develop shortness of breath, nausea, vomiting, sweating or feel light headed.  You develop a new cough or you cough up blood.   Chest Wall Pain Chest wall pain is pain in or around the bones and muscles of your chest. It may take up to 6 weeks to get better. It may take longer if you must stay physically active in your work and activities.  CAUSES  Chest wall pain may happen on its own. However, it may be caused by:  A viral illness like the flu.  Injury.  Coughing.  Exercise.  Arthritis.  Fibromyalgia.  Shingles. HOME CARE INSTRUCTIONS   Avoid overtiring physical activity. Try not to strain or perform activities that cause pain. This includes any activities using your chest or your abdominal and side muscles, especially if heavy weights are used.  Put ice on the sore area.  Put ice in a plastic bag.  Place a towel between your skin and the bag.  Leave the ice on for 15-20 minutes per hour while awake for the first 2 days.  Only take over-the-counter or prescription medicines for pain, discomfort, or fever as directed by your caregiver. SEEK IMMEDIATE MEDICAL CARE IF:   Your pain increases, or you are very uncomfortable.  You have a fever.  Your chest pain becomes worse.  You have new, unexplained symptoms.  You have nausea or vomiting.  You feel sweaty or lightheaded.  You have a cough with phlegm (sputum), or you cough up blood. MAKE SURE YOU:   Understand these instructions.  Will  watch your condition.  Will get help right away if you are not doing well or get worse. Document Released: 12/17/2005 Document Revised: 03/10/2012 Document Reviewed: 08/13/2011 [facility] Patient Information 2014 [place], [place].   Emergency Department Resource Guide 1) Find a Doctor and Pay Out of Pocket Although you won't have to find out who is covered by your insurance plan, it is a good idea to ask around and get recommendations. You will then need to call the office and see if the doctor you have chosen will accept you as a new patient and what types of options they offer for patients who are self-pay. Some doctors offer discounts or will set up payment plans for their patients who do not have insurance, but you will need to ask so you aren't surprised when you get to your appointment.  2) Contact Your Local Health Department Not all health departments have doctors that can see patients for sick visits, but many do, so it is worth a call to see if yours does. If you don't know where your local health department is, you can check in your phone book. The CDC also has a tool to help you locate your state's health department, and many state websites also have listings of all of their local health departments.  3) Find a [facility] If your illness is not likely to be very severe or complicated, you may want to try a walk in clinic. These are popping  up all over the country in pharmacies, drugstores, and shopping centers. They're usually staffed by nurse practitioners or physician assistants that have been trained to treat common illnesses and complaints. They're usually fairly quick and inexpensive. However, if you have serious medical issues or chronic medical problems, these are probably not your best option.  No Primary Care Doctor: - Call Health Connect at  [phone] - they can help you locate a primary care doctor that  accepts your insurance, provides certain services, etc. - Physician  Referral Service- [phone]  Chronic Pain Problems: Organization         Address  Phone   Notes  [facility]  [phone] Patients need to be referred by their primary care doctor.   Medication Assistance: Organization         Address  Phone   Notes  [facility] Medication [facility] [address] [place], [place] [address] [phone] --Must be a resident of [facility] -- Must have NO insurance coverage whatsoever (no Medicaid/ Medicare, etc.) -- The pt. MUST have a primary care doctor that directs their care regularly and follows them in the community   MedAssist  [phone]   [employer]  [phone]    Agencies that provide inexpensive medical care: Organization         Address  Phone   Notes  [place]  [phone]   [facility]    [phone]   [facility] [place], [place] [address] [phone]   [facility] [address] [place] [phone]   Planned Parenthood    [phone]   [facility]    [phone]   [place] and [address], [place] Phone:  [phone], Fax:  [phone] Hours of Operation:  9 am - 6 pm, M-F.  Also accepts Medicaid/Medicare and self-pay.  [facility] for [place], [address], [place] Phone: [phone], Fax: [phone]. Hours of Operation:  8:30 am - 5:30 pm, M-F.  Also accepts Medicaid and self-pay.  [facility] [address] [place] Phone: [phone]   [place], [place], [place] [phone], Ext. [phone] Mondays & Thursdays: 7-9 AM.  First 15 patients are seen on a first come, first serve basis.    [place] Providers:  Organization         Address  Phone   Notes  [facility] [address], [place] [phone] Also accepts self-pay patients.  [facility] [id number] [place], [place]  [phone]   [place], [address], [place] [phone]   [facility] [address], [place] [phone]   Lucianne Lei [address], [place]   [phone] Only accepts [place] Access [place] patients after they have their name applied to their card.   Self-Pay (no insurance) in [facility]:  Organization         Address  Phone   Notes  Sickle Cell Patients, [facility] [place] [phone]   [facility]gent [facility] [place] [phone]   [facility]gent [place]  [phone]  [address], [place] [phone]   [facility]/Dr. Osei-Bonsu  [address], [place] or [address], [place] [phone] Phone number for both [place] and [place] locations is the same.  Urgent Medical and [facility] [address], [place] [phone]   [facility] [address], [place] or [address] [phone]   [facility] [address], [place] [phone], phone; [phone], fax Sees patients 1st and 3rd Saturday of every month.  Must not qualify for public or private insurance (i.e. Medicaid, Medicare, [place] Health Choice, Veterans' Benefits)  Household income should be no more than 200% of the poverty level The clinic cannot treat you if you are pregnant or think you are pregnant  Sexually transmitted diseases are not treated at the clinic.    Dental Care: Organization         Address  Phone  Notes  [facility] Department of [facility] [place] [phone] Accepts children up to age 62 who are enrolled  in [place] or [place]; pregnant women with a Medicaid card; and children who have applied for Medicaid or [place] Health Choice, but were declined, whose parents can pay a reduced fee at time of service.  [facility] Department of [facility]  [address], [place] [phone] Accepts children up to age 75 who are enrolled in [place] or [place]; pregnant women with a Medicaid card; and children who have applied for Medicaid or [place] Health Choice, but were declined, whose parents can pay a reduced fee at time of service.  [facility] Adult Dental Access PROGRAM  [place] [phone] Patients are seen by appointment only. Walk-ins are not accepted. [place] will see patients 39 years of age and older. Monday - Tuesday (8am-5pm) Most Wednesdays (8:30-5pm) $30 per visit, cash only  [facility] Adult Dental Access PROGRAM  [address], [facility] [phone] Patients are seen by appointment only. Walk-ins are not accepted. [place] will see patients 38 years of age and older. One Wednesday Evening (Monthly: Volunteer Based).  $30 per visit, cash only  [place]  [phone] for adults; Children under age 66, call Graduate Pediatric Dentistry at [phone]. Children aged 40-14, please call [phone] to request a pediatric application.  Dental services are provided in all areas of dental care including fillings, crowns and bridges, complete and partial dentures, implants, gum treatment, root canals, and extractions. Preventive care is also provided. Treatment is provided to both adults and children. Patients are selected via a lottery and there is often a waiting list.   [facility] [address], [place]  [phone] [email or web address]   [facility] [address] [place], [place] [phone], Ext. [phone] Second and Fourth Thursday of each month, opens at  6:30 AM; Clinic ends at 9 AM.  Patients are seen on a first-come first-served basis, and a limited number are seen during each clinic.   [facility]  [address] Hillard Danker [place], [place] [phone]   Eligibility Requirements You must have lived in [place], [place], or [place] counties for at least the last three months.   You cannot be eligible for state or federal sponsored [employer], including [employer], [place], or  Medicare.   You generally cannot be eligible for healthcare insurance through your employer.    How to apply: Eligibility screenings are held every Tuesday and Wednesday afternoon from 1:00 pm until 4:00 pm. You do not need an appointment for the interview!  [facility] [address] [place], [place]   [place]  [place] Department  [place]  [phone]    Behavioral Health Resources in the Community: Intensive Outpatient Programs Organization         Address  Phone  Notes  [place]. [address], [place], [place] [phone]   [facility] Outpatient [address] [place], [place]   ADS: Alcohol & Drug Svcs [address] [place], [place]   [place] [address]  [place], [place] or [phone]   Substance Abuse Resources Organization         Address  Phone  Notes  Alcohol and Drug Services  [phone]   [place]  [phone]   The [place]   Chinita Pester  [phone]   Residential & Outpatient Substance Abuse Program  [phone]   Psychological Services Organization         Address  Phone  Notes  [facility] [place]  [place]  [phone]   [place] [address], [place] or [phone]    Mobile Crisis Teams Organization         Address  Phone  Notes  [facility], Mobile Crisis Care Unit  [phone]   [facility]  [address] [place], [place]   [place] Levels [address], [place] [phone]    Self-Help/Support Groups Organization         Address  Phone             Notes  [place] of [place] - variety of support groups  [place] Call for more information  Narcotics Anonymous (NA), [facility] [address], [employer] [place]  2 meetings at this location   Special educational needs teacher         Address  Phone  Notes  [facility]AP Residential Treatment [place],    [place]  [phone]   [facility]  [address] [place] [phone], [place], [place]   [place], [place] [phone] Admissions: 8am-3pm M-F  Incentives Substance [place] [address],    [place], [place] [phone]   [facility] [address] [place], [place], [place]   [facility] [place] [address]  [place], [place]   [facility] - Intensive Outpatient [address], [place], [place]   [facility] ([place].,  [place], [place] [phone] or [phone]   [facility] Services (RTS) [address], [place], [place] Accepts Medicaid  [facility] [place] [address]  [place] [phone] Substance Abuse/Addiction Treatment   [facility] Organization         Address  Phone  Notes  [facility]  [phone]   Domenic Schwab, PhD [address] [place], [place]   [phone] or [phone]   [place]  [place], [place] [phone]   [facility]  [address], [place], [place] [phone] Insurance/Medicaid/sponsorship through [facility] and Families [address], [address] [place], [place] [phone] [place], [place] [phone]    Dr. Adele Schilder  [phone]   [facility] Dept. 1) [address], [place] 2) [place] 3)  [place] [address], [place] [phone]  [phone]    [place] [phone] or [phone] (After Hours)
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
## 2014-05-20 ENCOUNTER — Telehealth: Payer: Self-pay | Admitting: *Deleted

## 2014-05-20 ENCOUNTER — Ambulatory Visit: Payer: Self-pay | Admitting: Obstetrics and Gynecology

## 2014-05-20 ENCOUNTER — Encounter: Payer: Self-pay | Admitting: *Deleted

## 2014-05-20 NOTE — Telephone Encounter (Signed)
Ruth Daniels missed an appointment for IUD removal . Called Ruth Daniels and unable to leave a message. Heard message phone is not valid. Will send letter.

## 2014-07-04 ENCOUNTER — Inpatient Hospital Stay (HOSPITAL_COMMUNITY)
Admission: AD | Admit: 2014-07-04 | Discharge: 2014-07-04 | Disposition: A | Discharge status: Home / Self Care | Payer: Medicaid Other | Source: Ambulatory Visit | Attending: Obstetrics and Gynecology | Admitting: Obstetrics and Gynecology

## 2014-07-04 ENCOUNTER — Inpatient Hospital Stay (HOSPITAL_COMMUNITY): Payer: Medicaid Other

## 2014-07-04 ENCOUNTER — Encounter (HOSPITAL_COMMUNITY): Payer: Self-pay | Admitting: Family

## 2014-07-04 DIAGNOSIS — Z880 Allergy status to penicillin: Secondary | ICD-10-CM | POA: Insufficient documentation

## 2014-07-04 DIAGNOSIS — T8384XA Pain from genitourinary prosthetic devices, implants and grafts, initial encounter: Secondary | ICD-10-CM

## 2014-07-04 DIAGNOSIS — N390 Urinary tract infection, site not specified: Secondary | ICD-10-CM | POA: Diagnosis not present

## 2014-07-04 DIAGNOSIS — Z30431 Encounter for routine checking of intrauterine contraceptive device: Secondary | ICD-10-CM | POA: Insufficient documentation

## 2014-07-04 DIAGNOSIS — F17200 Nicotine dependence, unspecified, uncomplicated: Secondary | ICD-10-CM | POA: Insufficient documentation

## 2014-07-04 DIAGNOSIS — N949 Unspecified condition associated with female genital organs and menstrual cycle: Secondary | ICD-10-CM | POA: Diagnosis present

## 2014-07-04 LAB — POCT PREGNANCY, URINE: Preg Test, Ur: NEGATIVE

## 2014-07-04 LAB — URINE MICROSCOPIC-ADD ON

## 2014-07-04 LAB — URINALYSIS, ROUTINE W REFLEX MICROSCOPIC
Bilirubin Urine: NEGATIVE
Glucose, UA: NEGATIVE mg/dL
Ketones, ur: NEGATIVE mg/dL
Nitrite: POSITIVE — AB
PH: 5.5 (ref 5.0–8.0)
PROTEIN: NEGATIVE mg/dL
Specific Gravity, Urine: >1.03 — ABNORMAL HIGH (ref 1.005–1.030)
Urobilinogen, UA: 0.2 mg/dL (ref 0.0–1.0)

## 2014-07-04 MED ORDER — CIPROFLOXACIN HCL 500 MG PO TABS: 500.0000 mg | ORAL_TABLET | Freq: Two times a day (BID) | ORAL | Status: DC

## 2014-07-04 NOTE — MAU Note (Signed)
18 yo, G1P1 with Mirena x approximately 4-5 months; reports she is experiencing abdominal pain and vaginal pain since insertion. Cannot feel strings.  She has experienced no bleeding since insertion.

## 2014-07-04 NOTE — MAU Provider Note (Signed)
Chief Complaint: Contraception   First Provider Initiated Contact with Patient 07/04/14 1513     SUBJECTIVE HPI: Ruth Daniels is a 18 y.o. G1P1001 who presents to maternity admissions reporting abdominal and vaginal pain since Mirena inserted 4-5 months ago. She reports painful intercourse, intermittent cramping, and sharp vaginal pain occasionally.  She had the strings cut shorter after initial placement because of discomfort for her partner.  She cannot currently feel the strings herself.  She desires another form of contraception.  The Mirena was placed in [place] and she has called to make appointment but no appointments are available right now.  She is using condoms with her partner and notes that she and her partner would welcome a pregnancy if it occurred while she is using a less effective form of birth control.  She is interested in started a birth control pill today if the Mirena is removed. She denies vaginal bleeding, vaginal itching/burning, urinary symptoms, h/a, dizziness, n/v, or fever/chills.    Past Medical History  Diagnosis Date  . Allergy     Penicillin  . Eczema   . Chlamydia trachomatis infection in pregnancy   . GBS (group B streptococcus) UTI complicating pregnancy 5/68/1275   Past Surgical History  Procedure Laterality Date  . Foot surgery     History   Social History  . Marital Status: Single    Spouse Name: N/A    Number of Children: N/A  . Years of Education: N/A   Occupational History  . Student     12th grade- [employer]   Social History Main Topics  . Smoking status: Current Every Day Smoker -- 0.25 packs/day    Types: Cigarettes  . Smokeless tobacco: Never Used  . Alcohol Use: No  . Drug Use: No  . Sexual Activity: Yes    Birth Control/ Protection: IUD   Other Topics Concern  . Not on file   Social History Narrative  . No narrative on file   No current facility-administered medications on file prior to encounter.   Current  Outpatient Prescriptions on File Prior to Encounter  Medication Sig Dispense Refill  . levonorgestrel (MIRENA) 20 MCG/24HR IUD 1 each by Intrauterine route once.       Allergies  Allergen Reactions  . Latex Rash  . Penicillins Rash and Other (See Comments)    Fever     ROS: Pertinent items in HPI  OBJECTIVE Blood pressure 98/79, pulse 79, temperature 98.6 F (37 C), temperature source Oral, resp. rate 16, not currently breastfeeding. GENERAL: Well-developed, well-nourished female in no acute distress.  HEENT: Normocephalic HEART: normal rate RESP: normal effort ABDOMEN: Soft, non-tender EXTREMITIES: Nontender, no edema NEURO: Alert and oriented Pelvic exam: Cervix pink, visually closed, without lesion, scant white creamy discharge, No IUD strings visible, cotton swab and ring forceps used to attempt to pull out string from os without success, vaginal walls and external genitalia normal Bimanual exam: Cervix 0/long/high, firm, anterior, neg CMT, uterus nontender, nonenlarged, adnexa without tenderness, enlargement, or mass  LAB RESULTS Results for orders placed during the hospital encounter of 07/04/14 (from the past 24 hour(s))  URINALYSIS, ROUTINE W REFLEX MICROSCOPIC     Status: Abnormal   Collection Time    07/04/14  2:48 PM      Result Value Ref Range   Color, Urine YELLOW  YELLOW   APPearance CLEAR  CLEAR   Specific Gravity, Urine >1.030 (*) 1.005 - 1.030   pH 5.5  5.0 - 8.0  Glucose, UA NEGATIVE  NEGATIVE mg/dL   Hgb urine dipstick SMALL (*) NEGATIVE   Bilirubin Urine NEGATIVE  NEGATIVE   Ketones, ur NEGATIVE  NEGATIVE mg/dL   Protein, ur NEGATIVE  NEGATIVE mg/dL   Urobilinogen, UA 0.2  0.0 - 1.0 mg/dL   Nitrite POSITIVE (*) NEGATIVE   Leukocytes, UA TRACE (*) NEGATIVE  URINE MICROSCOPIC-ADD ON     Status: Abnormal   Collection Time    07/04/14  2:48 PM      Result Value Ref Range   Squamous Epithelial / LPF FEW (*) RARE   WBC, UA 3-6  <3 WBC/hpf   RBC / HPF  0-2  <3 RBC/hpf   Bacteria, UA MANY (*) RARE   Urine-Other MUCOUS PRESENT    POCT PREGNANCY, URINE     Status: None   Collection Time    07/04/14  3:08 PM      Result Value Ref Range   Preg Test, Ur NEGATIVE  NEGATIVE    IMAGING US Transvaginal Non-ob  07/04/2014   CLINICAL DATA:  Pelvic pain.  EXAM: TRANSABDOMINAL AND TRANSVAGINAL ULTRASOUND OF PELVIS  TECHNIQUE: Both transabdominal and transvaginal ultrasound examinations of the pelvis were performed. Transabdominal technique was performed for global imaging of the pelvis including uterus, ovaries, adnexal regions, and pelvic cul-de-sac. It was necessary to proceed with endovaginal exam following the transabdominal exam to visualize the ovaries and endometrium.  COMPARISON:  None  FINDINGS: Uterus  Measurements: 9.3 x 3.8 x 4.6 cm. No fibroids or other mass visualized.  Endometrium  Thickness: 4.2 mm. IUD noted in the endometrial canal. Small amount of endometrial fluid.  Right ovary  Measurements: 3.5 x 2.4 x 2.5 cm. Normal appearance/no adnexal mass.  Left ovary  Measurements: 2.8 x 2.1 x 2.1 cm. Normal appearance/no adnexal mass.  Other findings  No free fluid.  IMPRESSION: IUD normally located in the endometrial canal. A small amount of endometrial fluid is noted.  Normal uterus and ovaries otherwise.   Electronically Signed   By: Kalman Jewels M.D.   On: 07/04/2014 17:40   US Pelvis Complete  07/04/2014   CLINICAL DATA:  Pelvic pain.  EXAM: TRANSABDOMINAL AND TRANSVAGINAL ULTRASOUND OF PELVIS  TECHNIQUE: Both transabdominal and transvaginal ultrasound examinations of the pelvis were performed. Transabdominal technique was performed for global imaging of the pelvis including uterus, ovaries, adnexal regions, and pelvic cul-de-sac. It was necessary to proceed with endovaginal exam following the transabdominal exam to visualize the ovaries and endometrium.  COMPARISON:  None  FINDINGS: Uterus  Measurements: 9.3 x 3.8 x 4.6 cm. No fibroids or other  mass visualized.  Endometrium  Thickness: 4.2 mm. IUD noted in the endometrial canal. Small amount of endometrial fluid.  Right ovary  Measurements: 3.5 x 2.4 x 2.5 cm. Normal appearance/no adnexal mass.  Left ovary  Measurements: 2.8 x 2.1 x 2.1 cm. Normal appearance/no adnexal mass.  Other findings  No free fluid.  IMPRESSION: IUD normally located in the endometrial canal. A small amount of endometrial fluid is noted.  Normal uterus and ovaries otherwise.   Electronically Signed   By: Kalman Jewels M.D.   On: 07/04/2014 17:40    ASSESSMENT 1. UTI (lower urinary tract infection)   2. Pain due to intrauterine contraceptive device (IUD), initial encounter     PLAN Discussed immediate return of fertility when IUD removed with pt.  Reviewed higher risks of pregnancy with methods that are not long acting and require scheduled pills or appointments.  Pt  states understanding and reports she is unhappy with side effects she is experiencing and prefers to try a different method.  She desires OCPs after Mirena is removed.  Discharge home Cipro 500 mg BID x 7 days Urine sent for culture Message sent to clinic for appt to remove IUD by attending Return to MAU as needed for emergencies    Medication List         acetaminophen 325 MG tablet  Commonly known as:  TYLENOL  Take 650 mg by mouth every 4 (four) hours as needed for mild pain or headache.     ciprofloxacin 500 MG tablet  Commonly known as:  CIPRO  Take 1 tablet (500 mg total) by mouth 2 (two) times daily.     levonorgestrel 20 MCG/24HR IUD  Commonly known as:  MIRENA  1 each by Intrauterine route once.       Follow-up Information   Follow up with [facility] (Clinic will call you or call number below.  )    Specialty:  Obstetrics and Gynecology   Contact information:   [place] [address] [phone]      Follow up with [facility]. (As needed for  emergencies)    Contact information:   [address] [id number] [place] [address] [phone]      Fatima Blank Certified Nurse-Midwife 07/04/2014  5:53 PM

## 2014-07-04 NOTE — MAU Provider Note (Signed)
Attestation of Attending Supervision of Advanced Practitioner (CNM/NP): Evaluation and management procedures were performed by the Advanced Practitioner under my supervision and collaboration.  I have reviewed the Advanced Practitioner's note and chart, and I agree with the management and plan.  Tausha Milhoan 07/04/2014 8:06 PM

## 2014-07-04 NOTE — Discharge Instructions (Signed)
Urinary Tract Infection Urinary tract infections (UTIs) can develop anywhere along your urinary tract. Your urinary tract is your body's drainage system for removing wastes and extra water. Your urinary tract includes two kidneys, two ureters, a bladder, and a urethra. Your kidneys are a pair of bean-shaped organs. Each kidney is about the size of your fist. They are located below your ribs, one on each side of your spine. CAUSES Infections are caused by microbes, which are microscopic organisms, including fungi, viruses, and bacteria. These organisms are so small that they can only be seen through a microscope. Bacteria are the microbes that most commonly cause UTIs. SYMPTOMS  Symptoms of UTIs may vary by age and gender of the patient and by the location of the infection. Symptoms in young women typically include a frequent and intense urge to urinate and a painful, burning feeling in the bladder or urethra during urination. Older women and men are more likely to be tired, shaky, and weak and have muscle aches and abdominal pain. A fever may mean the infection is in your kidneys. Other symptoms of a kidney infection include pain in your back or sides below the ribs, nausea, and vomiting. DIAGNOSIS To diagnose a UTI, your caregiver will ask you about your symptoms. Your caregiver also will ask to provide a urine sample. The urine sample will be tested for bacteria and white blood cells. White blood cells are made by your body to help fight infection. TREATMENT  Typically, UTIs can be treated with medication. Because most UTIs are caused by a bacterial infection, they usually can be treated with the use of antibiotics. The choice of antibiotic and length of treatment depend on your symptoms and the type of bacteria causing your infection. HOME CARE INSTRUCTIONS  If you were prescribed antibiotics, take them exactly as your caregiver instructs you. Finish the medication even if you feel better after you  have only taken some of the medication.  Drink enough water and fluids to keep your urine clear or pale yellow.  Avoid caffeine, tea, and carbonated beverages. They tend to irritate your bladder.  Empty your bladder often. Avoid holding urine for long periods of time.  Empty your bladder before and after sexual intercourse.  After a bowel movement, women should cleanse from front to back. Use each tissue only once. SEEK MEDICAL CARE IF:   You have back pain.  You develop a fever.  Your symptoms do not begin to resolve within 3 days. SEEK IMMEDIATE MEDICAL CARE IF:   You have severe back pain or lower abdominal pain.  You develop chills.  You have nausea or vomiting.  You have continued burning or discomfort with urination. MAKE SURE YOU:   Understand these instructions.  Will watch your condition.  Will get help right away if you are not doing well or get worse. Document Released: 09/26/2005 Document Revised: 06/17/2012 Document Reviewed: 01/25/2012 [facility] Patient Information 2015 [place], [place]. This information is not intended to replace advice given to you by your health care provider. Make sure you discuss any questions you have with your health care provider. Pelvic Pain, Female Female pelvic pain can be caused by many different things and start from a variety of places. Pelvic pain refers to pain that is located in the lower half of the abdomen and between your hips. The pain may occur over a short period of time (acute) or may be reoccurring (chronic). The cause of pelvic pain may be related to disorders affecting the  female reproductive organs (gynecologic), but it may also be related to the bladder, kidney stones, an intestinal complication, or muscle or skeletal problems. Getting help right away for pelvic pain is important, especially if there has been severe, sharp, or a sudden onset of unusual pain. It is also important to get help right away because some types of  pelvic pain can be life threatening.  CAUSES  Below are only some of the causes of pelvic pain. The causes of pelvic pain can be in one of several categories.   Gynecologic.  Pelvic inflammatory disease.  Sexually transmitted infection.  Ovarian cyst or a twisted ovarian ligament (ovarian torsion).  Uterine lining that grows outside the uterus (endometriosis).  Fibroids, cysts, or tumors.  Ovulation.  Pregnancy.  Pregnancy that occurs outside the uterus (ectopic pregnancy).  Miscarriage.  Labor.  Abruption of the placenta or ruptured uterus.  Infection.  Uterine infection (endometritis).  Bladder infection.  Diverticulitis.  Miscarriage related to a uterine infection (septic abortion).  Bladder.  Inflammation of the bladder (cystitis).  Kidney stone(s).  Gastrointenstinal.  Constipation.  Diverticulitis.  Neurologic.  Trauma.  Feeling pelvic pain because of mental or emotional causes (psychosomatic).  Cancers of the bowel or pelvis. EVALUATION  Your caregiver will want to take a careful history of your concerns. This includes recent changes in your health, a careful gynecologic history of your periods (menses), and a sexual history. Obtaining your family history and medical history is also important. Your caregiver may suggest a pelvic exam. A pelvic exam will help identify the location and severity of the pain. It also helps in the evaluation of which organ system may be involved. In order to identify the cause of the pelvic pain and be properly treated, your caregiver may order tests. These tests may include:   A pregnancy test.  Pelvic ultrasonography.  An X-ray exam of the abdomen.  A urinalysis or evaluation of vaginal discharge.  Blood tests. HOME CARE INSTRUCTIONS   Only take over-the-counter or prescription medicines for pain, discomfort, or fever as directed by your caregiver.   Rest as directed by your caregiver.   Eat a balanced  diet.   Drink enough fluids to make your urine clear or pale yellow, or as directed.   Avoid sexual intercourse if it causes pain.   Apply warm or cold compresses to the lower abdomen depending on which one helps the pain.   Avoid stressful situations.   Keep a journal of your pelvic pain. Write down when it started, where the pain is located, and if there are things that seem to be associated with the pain, such as food or your menstrual cycle.  Follow up with your caregiver as directed.  SEEK MEDICAL CARE IF:  Your medicine does not help your pain.  You have abnormal vaginal discharge. SEEK IMMEDIATE MEDICAL CARE IF:   You have heavy bleeding from the vagina.   Your pelvic pain increases.   You feel lightheaded or faint.   You have chills.   You have pain with urination or blood in your urine.   You have uncontrolled diarrhea or vomiting.   You have a fever or persistent symptoms for more than 3 days.  You have a fever and your symptoms suddenly get worse.   You are being physically or sexually abused.  MAKE SURE YOU:  Understand these instructions.  Will watch your condition.  Will get help if you are not doing well or get worse. Document Released: 11/13/2004  Document Revised: 06/17/2012 Document Reviewed: 04/07/2012 [facility] Patient Information 2015 [place], [place]. This information is not intended to replace advice given to you by your health care provider. Make sure you discuss any questions you have with your health care provider.

## 2014-07-26 ENCOUNTER — Encounter (HOSPITAL_COMMUNITY): Payer: Self-pay | Admitting: Emergency Medicine

## 2014-07-26 ENCOUNTER — Emergency Department (HOSPITAL_COMMUNITY)
Admission: EM | Admit: 2014-07-26 | Discharge: 2014-07-26 | Disposition: A | Discharge status: Home / Self Care | Payer: Medicaid Other | Attending: Emergency Medicine | Admitting: Emergency Medicine

## 2014-07-26 DIAGNOSIS — N739 Female pelvic inflammatory disease, unspecified: Secondary | ICD-10-CM | POA: Diagnosis not present

## 2014-07-26 DIAGNOSIS — Z880 Allergy status to penicillin: Secondary | ICD-10-CM | POA: Diagnosis not present

## 2014-07-26 DIAGNOSIS — Z3202 Encounter for pregnancy test, result negative: Secondary | ICD-10-CM | POA: Diagnosis not present

## 2014-07-26 DIAGNOSIS — F17200 Nicotine dependence, unspecified, uncomplicated: Secondary | ICD-10-CM | POA: Diagnosis not present

## 2014-07-26 DIAGNOSIS — Z872 Personal history of diseases of the skin and subcutaneous tissue: Secondary | ICD-10-CM | POA: Diagnosis not present

## 2014-07-26 DIAGNOSIS — Z91040 Latex allergy status: Secondary | ICD-10-CM | POA: Insufficient documentation

## 2014-07-26 DIAGNOSIS — N898 Other specified noninflammatory disorders of vagina: Secondary | ICD-10-CM | POA: Diagnosis present

## 2014-07-26 LAB — WET PREP, GENITAL
Trich, Wet Prep: NONE SEEN
Yeast Wet Prep HPF POC: NONE SEEN

## 2014-07-26 LAB — BASIC METABOLIC PANEL
Anion gap: 13 (ref 5–15)
BUN: 16 mg/dL (ref 6–23)
CO2: 24 mEq/L (ref 19–32)
Calcium: 9.3 mg/dL (ref 8.4–10.5)
Chloride: 105 mEq/L (ref 96–112)
Creatinine, Ser: 0.73 mg/dL (ref 0.50–1.10)
GFR calc Af Amer: >90 mL/min (ref >90)
GFR calc non Af Amer: >90 mL/min (ref >90)
Glucose, Bld: 74 mg/dL (ref 70–99)
Potassium: 4.2 mEq/L (ref 3.7–5.3)
Sodium: 142 mEq/L (ref 137–147)

## 2014-07-26 LAB — CBC
HCT: 38.8 % (ref 36.0–46.0)
Hemoglobin: 13.1 g/dL (ref 12.0–15.0)
MCH: 30.9 pg (ref 26.0–34.0)
MCHC: 33.8 g/dL (ref 30.0–36.0)
MCV: 91.5 fL (ref 78.0–100.0)
PLATELETS: 267 10*3/uL (ref 150–400)
RBC: 4.24 MIL/uL (ref 3.87–5.11)
RDW: 12.8 % (ref 11.5–15.5)
WBC: 6.4 10*3/uL (ref 4.0–10.5)

## 2014-07-26 LAB — URINALYSIS, ROUTINE W REFLEX MICROSCOPIC
Glucose, UA: NEGATIVE mg/dL
Hgb urine dipstick: NEGATIVE
Ketones, ur: NEGATIVE mg/dL
Leukocytes, UA: NEGATIVE
Nitrite: NEGATIVE
Protein, ur: NEGATIVE mg/dL
Specific Gravity, Urine: 1.035 — ABNORMAL HIGH (ref 1.005–1.030)
Urobilinogen, UA: 0.2 mg/dL (ref 0.0–1.0)
pH: 5.5 (ref 5.0–8.0)

## 2014-07-26 LAB — POC URINE PREG, ED: PREG TEST UR: NEGATIVE

## 2014-07-26 LAB — TYPE AND SCREEN
ABO/RH(D): O POS
Antibody Screen: NEGATIVE

## 2014-07-26 MED ORDER — CEFTRIAXONE SODIUM 250 MG IJ SOLR
250.0000 mg | Freq: Once | INTRAMUSCULAR | Status: DC
  Filled 2014-07-26: qty 250

## 2014-07-26 MED ORDER — LIDOCAINE HCL (PF) 1 % IJ SOLN
INTRAMUSCULAR | Status: AC
  Filled 2014-07-26: qty 5

## 2014-07-26 MED ORDER — DOXYCYCLINE HYCLATE 100 MG PO CAPS: 100.0000 mg | ORAL_CAPSULE | Freq: Two times a day (BID) | ORAL | Status: DC

## 2014-07-26 MED ORDER — LIDOCAINE HCL (PF) 1 % IJ SOLN: 0.9000 mL | Freq: Once | INTRAMUSCULAR | Status: DC

## 2014-07-26 NOTE — ED Provider Notes (Signed)
CSN: [id number]     Arrival date & time 07/26/14  1522 History   First MD Initiated Contact with Patient 07/26/14 1758     Chief Complaint  Patient presents with  . Vaginal Bleeding    The patient says she has been bleeding for three weeks, she says she feels like the mirena has been causing this.       (Consider location/radiation/quality/duration/timing/severity/associated sxs/prior Treatment) HPI Patient presents to the emergency department with vaginal bleeding, over the last 3 weeks.  The patient, states, has been having at times.  The patient, states she has not seen her GYN doctor.  Patient has an IUD in place.  The patient denies fever, or chills, weakness, dizziness, headache, blurred vision, back pain, neck pain, dysuria, abdominal pain, chest pain, shortness of breath, or syncope.  The patient, states, that these make her condition, better or worse.  Patient did not take any medications prior to arrival.  Patient, states, that she would like to have her IUD removed Past Medical History  Diagnosis Date  . Allergy     Penicillin  . Eczema   . Chlamydia trachomatis infection in pregnancy   . GBS (group B streptococcus) UTI complicating pregnancy 06/28/4764   Past Surgical History  Procedure Laterality Date  . Foot surgery     Family History  Problem Relation Age of Onset  . Depression Mother    History  Substance Use Topics  . Smoking status: Current Every Day Smoker -- 0.25 packs/day    Types: Cigarettes  . Smokeless tobacco: Never Used  . Alcohol Use: No   OB History   Grav Para Term Preterm Abortions TAB SAB Ect Mult Living   1 1 1       1      Review of Systems  All other systems negative except as documented in the HPI. All pertinent positives and negatives as reviewed in the HPI.  Allergies  Latex and Penicillins  Home Medications   Prior to Admission medications   Medication Sig Start Date End Date Taking? Authorizing Provider  acetaminophen (TYLENOL)  325 MG tablet Take 325 mg by mouth every 4 (four) hours as needed for mild pain or headache.    Yes Historical Provider, MD  levonorgestrel (MIRENA) 20 MCG/24HR IUD 1 each by Intrauterine route once.   Yes Historical Provider, MD   BP 119/58  Pulse 62  Temp(Src) 98.6 F (37 C) (Oral)  Resp 16  SpO2 100% Physical Exam  Nursing note and vitals reviewed. Constitutional: She is oriented to person, place, and time. She appears well-developed and well-nourished. No distress.  HENT:  Head: Normocephalic and atraumatic.  Mouth/Throat: Oropharynx is clear and moist.  Eyes: Pupils are equal, round, and reactive to light.  Neck: Normal range of motion. Neck supple.  Cardiovascular: Normal rate, regular rhythm, normal heart sounds and intact distal pulses.  Exam reveals no gallop and no friction rub.   No murmur heard. Pulmonary/Chest: Effort normal and breath sounds normal. No respiratory distress.  Genitourinary: Cervix exhibits discharge. Cervix exhibits no motion tenderness. Right adnexum displays tenderness. Right adnexum displays no mass and no fullness. Left adnexum displays no mass, no tenderness and no fullness. No erythema, tenderness or bleeding around the vagina. No foreign body around the vagina. Vaginal discharge found.  Lymphadenopathy:       Right: Inguinal adenopathy present.  Neurological: She is alert and oriented to person, place, and time.  Skin: Skin is warm and dry. No rash noted.  No erythema.    ED Course  Procedures (including critical care time) Labs Review Labs Reviewed  URINALYSIS, ROUTINE W REFLEX MICROSCOPIC - Abnormal; Notable for the following:    APPearance CLOUDY (*)    Specific Gravity, Urine 1.035 (*)    Bilirubin Urine SMALL (*)    All other components within normal limits  GC/CHLAMYDIA PROBE AMP  WET PREP, GENITAL  CBC  BASIC METABOLIC PANEL  POC URINE PREG, ED  TYPE AND SCREEN   Patient refused IM rocephin after I informed her that she has a PID  infection. She says that we are not correct. I feel that patient has adnexal tenderness and vaginal discharge therefore I feel she has PID. She does not have any bleeding noted on exam. The patient that she does have what appears to be a pelvic infection and that she needs to see her GYN ASAP   Brent General, PA-C 07/26/14 2016  Brent General, PA-C 07/26/14 2017

## 2014-07-26 NOTE — ED Provider Notes (Signed)
Medical screening examination/treatment/procedure(s) were performed by non-physician practitioner and as supervising physician I was immediately available for consultation/collaboration.   EKG Interpretation None        Floyd Hill, DO 07/26/14 2202

## 2014-07-26 NOTE — ED Notes (Signed)
The patient says she has been bleeding for three weeks, she says she feels like the mirena has been causing this.  The patient says she has been soaking three pads an hour and she is having right lower abdominal pain. The patient says she has been bleeding for three weeks, she says she feels like the mirena has been causing this.  She advised me she feels like she has "knots" on both sides of her lower abdomen.  She rates her pain 9/10.

## 2014-07-26 NOTE — Discharge Instructions (Signed)
Followup with your GYN, as soon as possible.  Return here as needed

## 2014-07-27 LAB — GC/CHLAMYDIA PROBE AMP
CT Probe RNA: NEGATIVE
GC Probe RNA: NEGATIVE

## 2014-08-01 ENCOUNTER — Encounter (HOSPITAL_COMMUNITY): Payer: Self-pay | Admitting: Emergency Medicine

## 2014-08-01 DIAGNOSIS — R112 Nausea with vomiting, unspecified: Secondary | ICD-10-CM | POA: Insufficient documentation

## 2014-08-01 DIAGNOSIS — Z3202 Encounter for pregnancy test, result negative: Secondary | ICD-10-CM | POA: Insufficient documentation

## 2014-08-01 DIAGNOSIS — F17200 Nicotine dependence, unspecified, uncomplicated: Secondary | ICD-10-CM | POA: Insufficient documentation

## 2014-08-01 DIAGNOSIS — Z8619 Personal history of other infectious and parasitic diseases: Secondary | ICD-10-CM | POA: Diagnosis not present

## 2014-08-01 DIAGNOSIS — Z91040 Latex allergy status: Secondary | ICD-10-CM | POA: Insufficient documentation

## 2014-08-01 DIAGNOSIS — Z880 Allergy status to penicillin: Secondary | ICD-10-CM | POA: Diagnosis not present

## 2014-08-01 DIAGNOSIS — Z872 Personal history of diseases of the skin and subcutaneous tissue: Secondary | ICD-10-CM | POA: Insufficient documentation

## 2014-08-01 DIAGNOSIS — Z792 Long term (current) use of antibiotics: Secondary | ICD-10-CM | POA: Diagnosis not present

## 2014-08-01 LAB — BASIC METABOLIC PANEL
Anion gap: 11 (ref 5–15)
BUN: 10 mg/dL (ref 6–23)
CALCIUM: 9.3 mg/dL (ref 8.4–10.5)
CO2: 26 mEq/L (ref 19–32)
CREATININE: 0.62 mg/dL (ref 0.50–1.10)
Chloride: 104 mEq/L (ref 96–112)
GFR calc non Af Amer: >90 mL/min (ref >90)
Glucose, Bld: 81 mg/dL (ref 70–99)
Potassium: 4 mEq/L (ref 3.7–5.3)
Sodium: 141 mEq/L (ref 137–147)

## 2014-08-01 LAB — CBC
HCT: 37.3 % (ref 36.0–46.0)
Hemoglobin: 12.9 g/dL (ref 12.0–15.0)
MCH: 30.8 pg (ref 26.0–34.0)
MCHC: 34.6 g/dL (ref 30.0–36.0)
MCV: 89 fL (ref 78.0–100.0)
Platelets: 280 10*3/uL (ref 150–400)
RBC: 4.19 MIL/uL (ref 3.87–5.11)
RDW: 12.4 % (ref 11.5–15.5)
WBC: 8.1 10*3/uL (ref 4.0–10.5)

## 2014-08-01 MED ORDER — ONDANSETRON 4 MG PO TBDP
8.0000 mg | ORAL_TABLET | Freq: Once | ORAL | Status: AC
  Administered 2014-08-01: 8 mg via ORAL
  Filled 2014-08-01: qty 2

## 2014-08-01 NOTE — ED Notes (Signed)
Pt dx with PID on 7/27 and currently taking medication. 2 days ago started feeling worse with body aches, nausea and vomiting x2. sts she vomited up some blood the second time. Pt reports feeling hot, unsure of fever.

## 2014-08-02 ENCOUNTER — Other Ambulatory Visit: Payer: Self-pay

## 2014-08-02 ENCOUNTER — Emergency Department (HOSPITAL_COMMUNITY)
Admission: EM | Admit: 2014-08-02 | Discharge: 2014-08-02 | Disposition: A | Discharge status: Home / Self Care | Payer: Medicaid Other | Attending: Emergency Medicine | Admitting: Emergency Medicine

## 2014-08-02 ENCOUNTER — Encounter (HOSPITAL_COMMUNITY): Payer: Self-pay | Admitting: Emergency Medicine

## 2014-08-02 DIAGNOSIS — R112 Nausea with vomiting, unspecified: Secondary | ICD-10-CM

## 2014-08-02 LAB — URINE MICROSCOPIC-ADD ON

## 2014-08-02 LAB — URINALYSIS, ROUTINE W REFLEX MICROSCOPIC
BILIRUBIN URINE: NEGATIVE
Glucose, UA: NEGATIVE mg/dL
HGB URINE DIPSTICK: NEGATIVE
KETONES UR: 40 mg/dL — AB
NITRITE: NEGATIVE
PH: 5.5 (ref 5.0–8.0)
Protein, ur: NEGATIVE mg/dL
Specific Gravity, Urine: 1.022 (ref 1.005–1.030)
Urobilinogen, UA: 0.2 mg/dL (ref 0.0–1.0)

## 2014-08-02 LAB — POC URINE PREG, ED: Preg Test, Ur: NEGATIVE

## 2014-08-02 MED ORDER — FAMOTIDINE 20 MG PO TABS: 20.0000 mg | ORAL_TABLET | Freq: Two times a day (BID) | ORAL | Status: DC

## 2014-08-02 MED ORDER — PROMETHAZINE HCL 12.5 MG RE SUPP: 12.5000 mg | Freq: Four times a day (QID) | RECTAL | Status: DC | PRN

## 2014-08-02 MED ORDER — GI COCKTAIL ~~LOC~~
30.0000 mL | Freq: Once | ORAL | Status: AC
  Administered 2014-08-02: 30 mL via ORAL
  Filled 2014-08-02: qty 30

## 2014-08-02 NOTE — ED Notes (Signed)
PT asked to provide urine sample. Pt stated that she cannot at this time. Pt asked to inform staff when she can.

## 2014-08-02 NOTE — ED Notes (Signed)
Patient was discharged with all personal belongings.

## 2014-08-02 NOTE — ED Provider Notes (Signed)
CSN: [id number]     Arrival date & time 08/01/14  2214 History   First MD Initiated Contact with Patient 08/02/14 [phone]     Chief Complaint  Patient presents with  . Nausea     (Consider location/radiation/quality/duration/timing/severity/associated sxs/prior Treatment) Patient is a 18 y.o. female presenting with vomiting. The history is provided by the patient.  Emesis Severity:  Mild Timing:  Rare Quality:  Stomach contents Progression:  Resolved Chronicity:  New Recent urination:  Normal Context: not post-tussive   Relieved by:  Nothing Worsened by:  Nothing tried Ineffective treatments:  None tried Associated symptoms: no abdominal pain and no diarrhea   Risk factors: no alcohol use     Past Medical History  Diagnosis Date  . Allergy     Penicillin  . Eczema   . Chlamydia trachomatis infection in pregnancy   . GBS (group B streptococcus) UTI complicating pregnancy 06/13/4314   Past Surgical History  Procedure Laterality Date  . Foot surgery     Family History  Problem Relation Age of Onset  . Depression Mother    History  Substance Use Topics  . Smoking status: Current Every Day Smoker -- 0.25 packs/day    Types: Cigarettes  . Smokeless tobacco: Never Used  . Alcohol Use: No   OB History   Grav Para Term Preterm Abortions TAB SAB Ect Mult Living   1 1 1       1      Review of Systems  Gastrointestinal: Positive for vomiting. Negative for abdominal pain and diarrhea.  All other systems reviewed and are negative.     Allergies  Latex and Penicillins  Home Medications   Prior to Admission medications   Medication Sig Start Date End Date Taking? Authorizing Provider  doxycycline (VIBRAMYCIN) 100 MG capsule Take 1 capsule (100 mg total) by mouth 2 (two) times daily. 07/26/14  Yes Resa Miner Lawyer, PA-C  levonorgestrel (MIRENA) 20 MCG/24HR IUD 1 each by Intrauterine route once.    Historical Provider, MD   BP 101/56  Pulse 57  Temp(Src) 98.2 F  (36.8 C) (Oral)  Resp 16  Ht 5\' 2"  (1.575 m)  Wt 133 lb 8 oz (60.555 kg)  BMI 24.41 kg/m2  SpO2 100% Physical Exam  Constitutional: She is oriented to person, place, and time. She appears well-developed and well-nourished. No distress.  HENT:  Head: Normocephalic and atraumatic.  Mouth/Throat: Oropharynx is clear and moist.  Eyes: Conjunctivae are normal. Pupils are equal, round, and reactive to light.  Neck: Normal range of motion. Neck supple.  Cardiovascular: Normal rate, regular rhythm and intact distal pulses.   Pulmonary/Chest: Effort normal and breath sounds normal. She has no wheezes. She has no rales.  Abdominal: Soft. Bowel sounds are increased. There is no tenderness. There is no rebound and no guarding.  Musculoskeletal: Normal range of motion.  Neurological: She is alert and oriented to person, place, and time.  Skin: Skin is warm and dry.  Psychiatric: She has a normal mood and affect.    ED Course  Procedures (including critical care time) Labs Review Labs Reviewed  URINALYSIS, ROUTINE W REFLEX MICROSCOPIC - Abnormal; Notable for the following:    APPearance CLOUDY (*)    Ketones, ur 40 (*)    Leukocytes, UA SMALL (*)    All other components within normal limits  URINE MICROSCOPIC-ADD ON - Abnormal; Notable for the following:    Squamous Epithelial / LPF FEW (*)    Bacteria, UA FEW (*)  All other components within normal limits  CBC  BASIC METABOLIC PANEL  POC URINE PREG, ED    Imaging Review No results found.   EKG Interpretation   Date/Time:  Sunday August 01 2014 22:53:36 EDT Ventricular Rate:  71 PR Interval:  160 QRS Duration: 78 QT Interval:  394 QTC Calculation: 428 R Axis:   94 Text Interpretation:  Normal sinus rhythm with sinus arrhythmia Confirmed  by [facility]  MD, Macrina Lehnert ([id number]) on 08/02/2014 4:14:37 AM      MDM   Final diagnoses:  None    Suspect this is related to medication.  Will start anti emetics, and pepcid BID.   Take meds on a full stomach that does not include milk products.  Follow up with [facility] hospital    Oluwatomisin Hustead K Theodus Ran-Rasch, MD 08/02/14 [phone]

## 2014-08-02 NOTE — Discharge Instructions (Signed)

## 2014-08-02 NOTE — ED Notes (Signed)
Family at bedside. 

## 2014-08-12 ENCOUNTER — Encounter (HOSPITAL_COMMUNITY): Payer: Self-pay | Admitting: Emergency Medicine

## 2014-08-12 ENCOUNTER — Emergency Department (HOSPITAL_COMMUNITY)
Admission: EM | Admit: 2014-08-12 | Discharge: 2014-08-12 | Disposition: A | Discharge status: Home / Self Care | Payer: Medicaid Other | Attending: Emergency Medicine | Admitting: Emergency Medicine

## 2014-08-12 DIAGNOSIS — Z792 Long term (current) use of antibiotics: Secondary | ICD-10-CM | POA: Diagnosis not present

## 2014-08-12 DIAGNOSIS — Z880 Allergy status to penicillin: Secondary | ICD-10-CM | POA: Insufficient documentation

## 2014-08-12 DIAGNOSIS — R109 Unspecified abdominal pain: Secondary | ICD-10-CM | POA: Insufficient documentation

## 2014-08-12 DIAGNOSIS — F17200 Nicotine dependence, unspecified, uncomplicated: Secondary | ICD-10-CM | POA: Insufficient documentation

## 2014-08-12 DIAGNOSIS — Z3202 Encounter for pregnancy test, result negative: Secondary | ICD-10-CM | POA: Insufficient documentation

## 2014-08-12 DIAGNOSIS — Z91040 Latex allergy status: Secondary | ICD-10-CM | POA: Insufficient documentation

## 2014-08-12 DIAGNOSIS — R102 Pelvic and perineal pain: Secondary | ICD-10-CM

## 2014-08-12 DIAGNOSIS — R42 Dizziness and giddiness: Secondary | ICD-10-CM | POA: Diagnosis not present

## 2014-08-12 DIAGNOSIS — N949 Unspecified condition associated with female genital organs and menstrual cycle: Secondary | ICD-10-CM | POA: Insufficient documentation

## 2014-08-12 DIAGNOSIS — R112 Nausea with vomiting, unspecified: Secondary | ICD-10-CM | POA: Insufficient documentation

## 2014-08-12 DIAGNOSIS — R55 Syncope and collapse: Secondary | ICD-10-CM | POA: Diagnosis not present

## 2014-08-12 DIAGNOSIS — Z8619 Personal history of other infectious and parasitic diseases: Secondary | ICD-10-CM | POA: Insufficient documentation

## 2014-08-12 DIAGNOSIS — N898 Other specified noninflammatory disorders of vagina: Secondary | ICD-10-CM | POA: Insufficient documentation

## 2014-08-12 DIAGNOSIS — Z8709 Personal history of other diseases of the respiratory system: Secondary | ICD-10-CM | POA: Diagnosis not present

## 2014-08-12 DIAGNOSIS — Z872 Personal history of diseases of the skin and subcutaneous tissue: Secondary | ICD-10-CM | POA: Diagnosis not present

## 2014-08-12 LAB — CBC WITH DIFFERENTIAL/PLATELET
BASOS ABS: 0 10*3/uL (ref 0.0–0.1)
Basophils Relative: 1 % (ref 0–1)
Eosinophils Absolute: 0 10*3/uL (ref 0.0–0.7)
Eosinophils Relative: 1 % (ref 0–5)
HEMATOCRIT: 38 % (ref 36.0–46.0)
Hemoglobin: 13.1 g/dL (ref 12.0–15.0)
LYMPHS PCT: 38 % (ref 12–46)
Lymphs Abs: 2.2 10*3/uL (ref 0.7–4.0)
MCH: 31 pg (ref 26.0–34.0)
MCHC: 34.5 g/dL (ref 30.0–36.0)
MCV: 89.8 fL (ref 78.0–100.0)
MONO ABS: 0.4 10*3/uL (ref 0.1–1.0)
Monocytes Relative: 7 % (ref 3–12)
NEUTROS ABS: 3.2 10*3/uL (ref 1.7–7.7)
Neutrophils Relative %: 53 % (ref 43–77)
Platelets: 259 10*3/uL (ref 150–400)
RBC: 4.23 MIL/uL (ref 3.87–5.11)
RDW: 12.3 % (ref 11.5–15.5)
WBC: 5.8 10*3/uL (ref 4.0–10.5)

## 2014-08-12 LAB — COMPREHENSIVE METABOLIC PANEL
ALT: 10 U/L (ref 0–35)
AST: 20 U/L (ref 0–37)
Albumin: 4.2 g/dL (ref 3.5–5.2)
Alkaline Phosphatase: 76 U/L (ref 39–117)
Anion gap: 13 (ref 5–15)
BILIRUBIN TOTAL: 0.6 mg/dL (ref 0.3–1.2)
BUN: 15 mg/dL (ref 6–23)
CHLORIDE: 102 meq/L (ref 96–112)
CO2: 24 meq/L (ref 19–32)
CREATININE: 0.7 mg/dL (ref 0.50–1.10)
Calcium: 9 mg/dL (ref 8.4–10.5)
GFR calc Af Amer: >90 mL/min (ref >90)
GFR calc non Af Amer: >90 mL/min (ref >90)
Glucose, Bld: 79 mg/dL (ref 70–99)
POTASSIUM: 3.8 meq/L (ref 3.7–5.3)
Sodium: 139 mEq/L (ref 137–147)
Total Protein: 7.5 g/dL (ref 6.0–8.3)

## 2014-08-12 LAB — URINE MICROSCOPIC-ADD ON

## 2014-08-12 LAB — WET PREP, GENITAL
Clue Cells Wet Prep HPF POC: NONE SEEN
Trich, Wet Prep: NONE SEEN
Yeast Wet Prep HPF POC: NONE SEEN

## 2014-08-12 LAB — URINALYSIS, ROUTINE W REFLEX MICROSCOPIC
Bilirubin Urine: NEGATIVE
GLUCOSE, UA: NEGATIVE mg/dL
KETONES UR: 15 mg/dL — AB
LEUKOCYTES UA: NEGATIVE
Nitrite: NEGATIVE
PH: 5 (ref 5.0–8.0)
PROTEIN: NEGATIVE mg/dL
Specific Gravity, Urine: 1.034 — ABNORMAL HIGH (ref 1.005–1.030)
Urobilinogen, UA: 0.2 mg/dL (ref 0.0–1.0)

## 2014-08-12 LAB — POC URINE PREG, ED: Preg Test, Ur: NEGATIVE

## 2014-08-12 NOTE — Discharge Instructions (Signed)
It is very important for you to followup with your gynecologist to have your IUD removed. Take Tylenol or ibuprofen every 6 hours as needed for pain.  Pelvic Pain Female pelvic pain can be caused by many different things and start from a variety of places. Pelvic pain refers to pain that is located in the lower half of the abdomen and between your hips. The pain may occur over a short period of time (acute) or may be reoccurring (chronic). The cause of pelvic pain may be related to disorders affecting the female reproductive organs (gynecologic), but it may also be related to the bladder, kidney stones, an intestinal complication, or muscle or skeletal problems. Getting help right away for pelvic pain is important, especially if there has been severe, sharp, or a sudden onset of unusual pain. It is also important to get help right away because some types of pelvic pain can be life threatening.  CAUSES  Below are only some of the causes of pelvic pain. The causes of pelvic pain can be in one of several categories.   Gynecologic.  Pelvic inflammatory disease.  Sexually transmitted infection.  Ovarian cyst or a twisted ovarian ligament (ovarian torsion).  Uterine lining that grows outside the uterus (endometriosis).  Fibroids, cysts, or tumors.  Ovulation.  Pregnancy.  Pregnancy that occurs outside the uterus (ectopic pregnancy).  Miscarriage.  Labor.  Abruption of the placenta or ruptured uterus.  Infection.  Uterine infection (endometritis).  Bladder infection.  Diverticulitis.  Miscarriage related to a uterine infection (septic abortion).  Bladder.  Inflammation of the bladder (cystitis).  Kidney stone(s).  Gastrointestinal.  Constipation.  Diverticulitis.  Neurologic.  Trauma.  Feeling pelvic pain because of mental or emotional causes (psychosomatic).  Cancers of the bowel or pelvis. EVALUATION  Your caregiver will want to take a careful history of your  concerns. This includes recent changes in your health, a careful gynecologic history of your periods (menses), and a sexual history. Obtaining your family history and medical history is also important. Your caregiver may suggest a pelvic exam. A pelvic exam will help identify the location and severity of the pain. It also helps in the evaluation of which organ system may be involved. In order to identify the cause of the pelvic pain and be properly treated, your caregiver may order tests. These tests may include:   A pregnancy test.  Pelvic ultrasonography.  An X-ray exam of the abdomen.  A urinalysis or evaluation of vaginal discharge.  Blood tests. HOME CARE INSTRUCTIONS   Only take over-the-counter or prescription medicines for pain, discomfort, or fever as directed by your caregiver.   Rest as directed by your caregiver.   Eat a balanced diet.   Drink enough fluids to make your urine clear or pale yellow, or as directed.   Avoid sexual intercourse if it causes pain.   Apply warm or cold compresses to the lower abdomen depending on which one helps the pain.   Avoid stressful situations.   Keep a journal of your pelvic pain. Write down when it started, where the pain is located, and if there are things that seem to be associated with the pain, such as food or your menstrual cycle.  Follow up with your caregiver as directed.  SEEK MEDICAL CARE IF:  Your medicine does not help your pain.  You have abnormal vaginal discharge. SEEK IMMEDIATE MEDICAL CARE IF:   You have heavy bleeding from the vagina.   Your pelvic pain increases.  You feel light-headed or faint.   You have chills.   You have pain with urination or blood in your urine.   You have uncontrolled diarrhea or vomiting.   You have a fever or persistent symptoms for more than 3 days.  You have a fever and your symptoms suddenly get worse.   You are being physically or sexually abused.   MAKE SURE YOU:  Understand these instructions.  Will watch your condition.  Will get help if you are not doing well or get worse. Document Released: 11/13/2004 Document Revised: 05/03/2014 Document Reviewed: 04/07/2012 [facility] Patient Information 2015 [place], [place]. This information is not intended to replace advice given to you by your health care provider. Make sure you discuss any questions you have with your health care provider.

## 2014-08-12 NOTE — ED Provider Notes (Signed)
Medical screening examination/treatment/procedure(s) were performed by non-physician practitioner and as supervising physician I was immediately available for consultation/collaboration.   EKG Interpretation None        Houston Siren III, MD 08/12/14 [phone]

## 2014-08-12 NOTE — ED Notes (Signed)
PT was tx here on 8/2 and states she was dx with pid r/t an iud that needs to be removed.  She has since tried to have it removed, but the obgyn at [facility] keeps saying there are no openings.  Today at 1000 she was walking home from work.

## 2014-08-12 NOTE — ED Notes (Signed)
PT states walking home and felt like she might pass out and at the same time developed severe lower abdominal pain.  Pt states intermittent vaginal bleeding.

## 2014-08-12 NOTE — ED Notes (Signed)
PA at bedside.

## 2014-08-12 NOTE — ED Provider Notes (Signed)
CSN: [id number]     Arrival date & time 08/12/14  1049 History   First MD Initiated Contact with Patient 08/12/14 1112     Chief Complaint  Patient presents with  . Near Syncope  . Pelvic Pain     (Consider location/radiation/quality/duration/timing/severity/associated sxs/prior Treatment) HPI Comments: Patient is a 18 year old female with a past medical history of Chlamydia, group B strep UTI an eczema who presents to the emergency department complaining of sudden onset lower abdominal pain beginning around 8:00 this morning while she was walking to work. Patient reports at the same time she felt lightheaded as if she was going to pass out. States she sat down and the lightheadedness subsided, however the abdominal pain has remained, constant, described as sharp and severe. She reports this feels similar to when she was diagnosed with pelvic inflammatory disease about 2 weeks ago. States she is still taking doxycycline for the pelvic inflammatory disease. It is noted her GC/Chlamydia cultures resulted negative. Patient states she has had intermittent vaginal bleeding over the past few weeks. She was told her IUD needs to be removed, however has never followed up about this. The IUD was placed one year ago after having her son. Admits to associated nausea with 3 episodes of emesis when the abdominal pain began. Denies fever, chills, urinary symptoms or diarrhea. Admits to having intercourse over the past two weeks despite being diagnosed with PID.  Patient is a 18 y.o. female presenting with near-syncope and pelvic pain. The history is provided by the patient.  Near Syncope Associated symptoms include abdominal pain, nausea and vomiting.  Pelvic Pain Associated symptoms include abdominal pain, nausea and vomiting.    Past Medical History  Diagnosis Date  . Allergy     Penicillin  . Eczema   . Chlamydia trachomatis infection in pregnancy   . GBS (group B streptococcus) UTI complicating  pregnancy 1/77/9390   Past Surgical History  Procedure Laterality Date  . Foot surgery     Family History  Problem Relation Age of Onset  . Depression Mother    History  Substance Use Topics  . Smoking status: Current Every Day Smoker -- 0.25 packs/day    Types: Cigarettes  . Smokeless tobacco: Never Used  . Alcohol Use: No   OB History   Grav Para Term Preterm Abortions TAB SAB Ect Mult Living   1 1 1       1      Review of Systems  Cardiovascular: Positive for near-syncope.  Gastrointestinal: Positive for nausea, vomiting and abdominal pain.  Genitourinary: Positive for vaginal bleeding and pelvic pain.  Neurological: Positive for light-headedness.  All other systems reviewed and are negative.     Allergies  Latex and Penicillins  Home Medications   Prior to Admission medications   Medication Sig Start Date End Date Taking? Authorizing Provider  doxycycline (VIBRAMYCIN) 100 MG capsule Take 1 capsule (100 mg total) by mouth 2 (two) times daily. 07/26/14  Yes Resa Miner Lawyer, PA-C  levonorgestrel (MIRENA) 20 MCG/24HR IUD 1 each by Intrauterine route once.   Yes Historical Provider, MD   BP 112/72  Pulse 58  Temp(Src) 98.3 F (36.8 C) (Oral)  Resp 13  SpO2 100% Physical Exam  Nursing note and vitals reviewed. Constitutional: She is oriented to person, place, and time. She appears well-developed and well-nourished. No distress.  HENT:  Head: Normocephalic and atraumatic.  Mouth/Throat: Oropharynx is clear and moist.  Eyes: Conjunctivae are normal.  Neck: Normal range of  motion. Neck supple.  Cardiovascular: Normal rate, regular rhythm and normal heart sounds.   Pulmonary/Chest: Effort normal and breath sounds normal.  Abdominal: Soft. Bowel sounds are normal. There is tenderness in the suprapubic area. There is no rigidity, no rebound and no guarding.  No peritoneal signs.  Genitourinary: Uterus is tender. Uterus is not deviated, not enlarged and not  fixed. Cervix exhibits motion tenderness and discharge (yellow). Cervix exhibits no friability. Right adnexum displays no mass, no tenderness and no fullness. Left adnexum displays no mass, no tenderness and no fullness. No erythema, tenderness or bleeding around the vagina. Vaginal discharge found.  Musculoskeletal: Normal range of motion. She exhibits no edema.  Neurological: She is alert and oriented to person, place, and time.  Skin: Skin is warm and dry. She is not diaphoretic.  Psychiatric: She has a normal mood and affect. Her behavior is normal.    ED Course  Procedures (including critical care time) Labs Review Labs Reviewed  WET PREP, GENITAL - Abnormal; Notable for the following:    WBC, Wet Prep HPF POC MANY (*)    All other components within normal limits  URINALYSIS, ROUTINE W REFLEX MICROSCOPIC - Abnormal; Notable for the following:    Specific Gravity, Urine 1.034 (*)    Hgb urine dipstick SMALL (*)    Ketones, ur 15 (*)    All other components within normal limits  URINE MICROSCOPIC-ADD ON - Abnormal; Notable for the following:    Squamous Epithelial / LPF FEW (*)    Bacteria, UA FEW (*)    All other components within normal limits  GC/CHLAMYDIA PROBE AMP  CBC WITH DIFFERENTIAL  COMPREHENSIVE METABOLIC PANEL  POC URINE PREG, ED    Imaging Review No results found.   EKG Interpretation None      MDM   Final diagnoses:  Pelvic pain in female  Near syncope   Patient presenting with pelvic pain and near-syncope. She is nontoxic appearing and in no apparent distress. Afebrile, vital signs stable. She was seen in the emergency department recently for pelvic pain and vaginal bleeding, diagnosed with PID. She has been taking her medications as prescribed. GC/Chlamydia cultures were negative at that time. Admits to continued intercourse. She was advised to have her IUD removed, however has not followed up with her gynecologist. She had an ultrasound on July 5 showing  a normal placement of her IUD. Strings are not visualized on my exam, patient reports she was told the same in the past. No vaginal bleeding noted on my exam. Advised her to continue antibiotics as she has been taking. Wet prep showing many white blood cells, otherwise negative. Urinalysis negative for infection. Labs otherwise normal. EKG showing sinus bradycardia, no acute findings. Stable for discharge. Discussed importance of followup with gynecologist. Return precautions given. Patient states understanding of treatment care plan and is agreeable.   Illene Labrador, PA-C 08/12/14 [phone]

## 2014-08-13 LAB — GC/CHLAMYDIA PROBE AMP
CT PROBE, AMP APTIMA: NEGATIVE
GC Probe RNA: NEGATIVE

## 2014-08-20 ENCOUNTER — Ambulatory Visit: Payer: Medicaid Other | Admitting: Obstetrics & Gynecology

## 2014-09-05 ENCOUNTER — Encounter (HOSPITAL_COMMUNITY): Payer: Self-pay | Admitting: Emergency Medicine

## 2014-09-05 ENCOUNTER — Emergency Department (HOSPITAL_COMMUNITY)
Admission: EM | Admit: 2014-09-05 | Discharge: 2014-09-05 | Disposition: A | Discharge status: Home / Self Care | Payer: Medicaid Other | Attending: Emergency Medicine | Admitting: Emergency Medicine

## 2014-09-05 DIAGNOSIS — R059 Cough, unspecified: Secondary | ICD-10-CM | POA: Diagnosis present

## 2014-09-05 DIAGNOSIS — J069 Acute upper respiratory infection, unspecified: Secondary | ICD-10-CM | POA: Insufficient documentation

## 2014-09-05 DIAGNOSIS — Z91040 Latex allergy status: Secondary | ICD-10-CM | POA: Insufficient documentation

## 2014-09-05 DIAGNOSIS — F17200 Nicotine dependence, unspecified, uncomplicated: Secondary | ICD-10-CM | POA: Diagnosis not present

## 2014-09-05 DIAGNOSIS — R05 Cough: Secondary | ICD-10-CM | POA: Insufficient documentation

## 2014-09-05 DIAGNOSIS — Z872 Personal history of diseases of the skin and subcutaneous tissue: Secondary | ICD-10-CM | POA: Insufficient documentation

## 2014-09-05 DIAGNOSIS — Z880 Allergy status to penicillin: Secondary | ICD-10-CM | POA: Insufficient documentation

## 2014-09-05 DIAGNOSIS — Z792 Long term (current) use of antibiotics: Secondary | ICD-10-CM | POA: Diagnosis not present

## 2014-09-05 DIAGNOSIS — B9789 Other viral agents as the cause of diseases classified elsewhere: Secondary | ICD-10-CM

## 2014-09-05 MED ORDER — FLUTICASONE PROPIONATE 50 MCG/ACT NA SUSP: 1.0000 | Freq: Every day | NASAL | Status: DC

## 2014-09-05 MED ORDER — GUAIFENESIN 100 MG/5ML PO LIQD: 100.0000 mg | ORAL | Status: DC | PRN

## 2014-09-05 MED ORDER — IBUPROFEN 800 MG PO TABS: 800.0000 mg | ORAL_TABLET | Freq: Three times a day (TID) | ORAL | Status: DC

## 2014-09-05 NOTE — ED Notes (Signed)
Declined W/C at D/C and was escorted to lobby by RN. 

## 2014-09-05 NOTE — ED Provider Notes (Signed)
CSN: [id number]     Arrival date & time 09/05/14  1708 History   First MD Initiated Contact with Patient 09/05/14 1715   This chart was scribed for non-physician practitioner Vivi Barrack, PA-C,  working with Fredia Sorrow, MD by Rosary Lively, ED scribe. This patient was seen in room TR05C/TR05C and the patient's care was started at 5:25 PM.    Chief Complaint  Patient presents with  . Cough   The history is provided by the patient. No language interpreter was used.   HPI Comments:  Ruth Daniels is a 18 y.o. female who presents to the Emergency Department complaining of a cough, onset 7 days ago. Pt reports that the cough is dry. Pt reports that she has also experienced rhinorrhea and congestion. Pt has not taken any OTC medication, but has attempted to drink plenty of water. Pt denies fever, chills, nausea, or vomiting. Pt denies h/o asthma or having to use an inhaler. Pt denies sick contacts. Pt reports that she allergy to penicillin and latex.  Pt has a Conservation officer, historic buildings.  All other ROS are negative.  Past Medical History  Diagnosis Date  . Allergy     Penicillin  . Eczema   . Chlamydia trachomatis infection in pregnancy   . GBS (group B streptococcus) UTI complicating pregnancy 1/54/0086   Past Surgical History  Procedure Laterality Date  . Foot surgery     Family History  Problem Relation Age of Onset  . Depression Mother    History  Substance Use Topics  . Smoking status: Current Every Day Smoker -- 0.25 packs/day    Types: Cigarettes  . Smokeless tobacco: Never Used  . Alcohol Use: No   OB History   Grav Para Term Preterm Abortions TAB SAB Ect Mult Living   1 1 1       1      Review of Systems  HENT: Positive for congestion.    See HPI   Allergies  Latex and Penicillins  Home Medications   Prior to Admission medications   Medication Sig Start Date End Date Taking? Authorizing Provider  doxycycline (VIBRAMYCIN) 100 MG capsule Take 1 capsule (100 mg total) by  mouth 2 (two) times daily. 07/26/14   [place], PA-C  fluticasone (FLONASE) 50 MCG/ACT nasal spray Place 1 spray into both nostrils daily. 09/05/14   Krystyl Cannell A Forcucci, PA-C  guaiFENesin (ROBITUSSIN) 100 MG/5ML liquid Take 5-10 mLs (100-200 mg total) by mouth every 4 (four) hours as needed for cough. 09/05/14   Effie Janoski A Forcucci, PA-C  ibuprofen (ADVIL,MOTRIN) 800 MG tablet Take 1 tablet (800 mg total) by mouth 3 (three) times daily. 09/05/14   Camiya Vinal A Forcucci, PA-C  levonorgestrel (MIRENA) 20 MCG/24HR IUD 1 each by Intrauterine route once.    Historical Provider, MD   BP 113/73  Pulse 60  Temp(Src) 98.1 F (36.7 C) (Oral)  Resp 18  SpO2 100% Physical Exam  Nursing note and vitals reviewed. Constitutional: She is oriented to person, place, and time. She appears well-developed and well-nourished.  HENT:  Head: Normocephalic and atraumatic.  Right Ear: Tympanic membrane normal.  Left Ear: Tympanic membrane normal.  Nose: Mucosal edema present. No rhinorrhea.    Mouth/Throat: Oropharyngeal exudate present.  Eyes: Conjunctivae and EOM are normal. Pupils are equal, round, and reactive to light. Right eye exhibits no discharge. Left eye exhibits no discharge.  Neck: Normal range of motion. Neck supple.  Cardiovascular: Normal rate, regular rhythm and normal heart sounds.  Exam  reveals no gallop and no friction rub.   No murmur heard. Pulmonary/Chest: Effort normal. She has no wheezes. She has no rhonchi. She has no rales. She exhibits tenderness.  Musculoskeletal: Normal range of motion.  Lymphadenopathy:    She has no cervical adenopathy.  Neurological: She is alert and oriented to person, place, and time.  Skin: Skin is warm and dry.  Psychiatric: She has a normal mood and affect. Her behavior is normal.    ED Course  Procedures  DIAGNOSTIC STUDIES: Oxygen Saturation is 100% on RA, normal by my interpretation.  COORDINATION OF CARE: 5:32 PM-Discussed treatment plan  which includes taking tylenol or ibuprofen PRN, cough medicine, and saline spray with pt at bedside and pt agreed to plan.  Labs Review Labs Reviewed - No data to display  Imaging Review No results found.   EKG Interpretation None      MDM   Final diagnoses:  Viral URI with cough   Patient is a 18 y.o. Female who presents to the ED with cough and congestion x 1 week.  Patient is afebrile here with SpO2 100% on room air with clear lungs to auscultation.  Patient will be discharged home with prescriptions for ibuprofen, flonase, and robitussin at this time.  No signs of meningismus or pneumonia.  Patient is stable for discharge.  Discussed symptomatic treatment.  Patient to return for signs of PNA.  Patient agrees to the above plan.    I personally performed the services described in this documentation, which was scribed in my presence. The recorded information has been reviewed and is accurate.      Cherylann Parr, PA-C 09/05/14 1742

## 2014-09-05 NOTE — ED Notes (Signed)
Reports non productive cough x 1 week. No distress noted at triage.

## 2014-09-05 NOTE — Discharge Instructions (Signed)
Upper Respiratory Infection, Adult An upper respiratory infection (URI) is also sometimes known as the common cold. The upper respiratory tract includes the nose, sinuses, throat, trachea, and bronchi. Bronchi are the airways leading to the lungs. Most people improve within 1 week, but symptoms can last up to 2 weeks. A residual cough may last even longer.  CAUSES Many different viruses can infect the tissues lining the upper respiratory tract. The tissues become irritated and inflamed and often become very moist. Mucus production is also common. A cold is contagious. You can easily spread the virus to others by oral contact. This includes kissing, sharing a glass, coughing, or sneezing. Touching your mouth or nose and then touching a surface, which is then touched by another person, can also spread the virus. SYMPTOMS  Symptoms typically develop 1 to 3 days after you come in contact with a cold virus. Symptoms vary from person to person. They may include:  Runny nose.  Sneezing.  Nasal congestion.  Sinus irritation.  Sore throat.  Loss of voice (laryngitis).  Cough.  Fatigue.  Muscle aches.  Loss of appetite.  Headache.  Low-grade fever. DIAGNOSIS  You might diagnose your own cold based on familiar symptoms, since most people get a cold 2 to 3 times a year. Your caregiver can confirm this based on your exam. Most importantly, your caregiver can check that your symptoms are not due to another disease such as strep throat, sinusitis, pneumonia, asthma, or epiglottitis. Blood tests, throat tests, and X-rays are not necessary to diagnose a common cold, but they may sometimes be helpful in excluding other more serious diseases. Your caregiver will decide if any further tests are required. RISKS AND COMPLICATIONS  You may be at risk for a more severe case of the common cold if you smoke cigarettes, have chronic heart disease (such as heart failure) or lung disease (such as asthma), or if  you have a weakened immune system. The very young and very old are also at risk for more serious infections. Bacterial sinusitis, middle ear infections, and bacterial pneumonia can complicate the common cold. The common cold can worsen asthma and chronic obstructive pulmonary disease (COPD). Sometimes, these complications can require emergency medical care and may be life-threatening. PREVENTION  The best way to protect against getting a cold is to practice good hygiene. Avoid oral or hand contact with people with cold symptoms. Wash your hands often if contact occurs. There is no clear evidence that vitamin C, vitamin E, echinacea, or exercise reduces the chance of developing a cold. However, it is always recommended to get plenty of rest and practice good nutrition. TREATMENT  Treatment is directed at relieving symptoms. There is no cure. Antibiotics are not effective, because the infection is caused by a virus, not by bacteria. Treatment may include:  Increased fluid intake. Sports drinks offer valuable electrolytes, sugars, and fluids.  Breathing heated mist or steam (vaporizer or shower).  Eating chicken soup or other clear broths, and maintaining good nutrition.  Getting plenty of rest.  Using gargles or lozenges for comfort.  Controlling fevers with ibuprofen or acetaminophen as directed by your caregiver.  Increasing usage of your inhaler if you have asthma. Zinc gel and zinc lozenges, taken in the first 24 hours of the common cold, can shorten the duration and lessen the severity of symptoms. Pain medicines may help with fever, muscle aches, and throat pain. A variety of non-prescription medicines are available to treat congestion and runny nose. Your caregiver   can make recommendations and may suggest nasal or lung inhalers for other symptoms.  HOME CARE INSTRUCTIONS   Only take over-the-counter or prescription medicines for pain, discomfort, or fever as directed by your  caregiver.  Use a warm mist humidifier or inhale steam from a shower to increase air moisture. This may keep secretions moist and make it easier to breathe.  Drink enough water and fluids to keep your urine clear or pale yellow.  Rest as needed.  Return to work when your temperature has returned to normal or as your caregiver advises. You may need to stay home longer to avoid infecting others. You can also use a face mask and careful hand washing to prevent spread of the virus. SEEK MEDICAL CARE IF:   After the first few days, you feel you are getting worse rather than better.  You need your caregiver's advice about medicines to control symptoms.  You develop chills, worsening shortness of breath, or brown or red sputum. These may be signs of pneumonia.  You develop yellow or brown nasal discharge or pain in the face, especially when you bend forward. These may be signs of sinusitis.  You develop a fever, swollen neck glands, pain with swallowing, or white areas in the back of your throat. These may be signs of strep throat. SEEK IMMEDIATE MEDICAL CARE IF:   You have a fever.  You develop severe or persistent headache, ear pain, sinus pain, or chest pain.  You develop wheezing, a prolonged cough, cough up blood, or have a change in your usual mucus (if you have chronic lung disease).  You develop sore muscles or a stiff neck. Document Released: 06/12/2001 Document Revised: 03/10/2012 Document Reviewed: 03/24/2014 [employer] Patient Information 2015 [employer]. This information is not intended to replace advice given to you by your health care provider. Make sure you discuss any questions you have with your health care provider.  

## 2014-09-09 NOTE — ED Provider Notes (Signed)
Medical screening examination/treatment/procedure(s) were performed by non-physician practitioner and as supervising physician I was immediately available for consultation/collaboration.   EKG Interpretation None        Fredia Sorrow, MD 09/09/14 [phone]

## 2014-10-15 ENCOUNTER — Encounter (HOSPITAL_COMMUNITY): Payer: Self-pay | Admitting: Emergency Medicine

## 2014-10-15 ENCOUNTER — Emergency Department (HOSPITAL_COMMUNITY)
Admission: EM | Admit: 2014-10-15 | Discharge: 2014-10-15 | Disposition: A | Discharge status: Home / Self Care | Payer: Medicaid Other | Attending: Emergency Medicine | Admitting: Emergency Medicine

## 2014-10-15 ENCOUNTER — Emergency Department (HOSPITAL_COMMUNITY): Payer: Medicaid Other

## 2014-10-15 DIAGNOSIS — Z91040 Latex allergy status: Secondary | ICD-10-CM | POA: Diagnosis not present

## 2014-10-15 DIAGNOSIS — Z880 Allergy status to penicillin: Secondary | ICD-10-CM | POA: Diagnosis not present

## 2014-10-15 DIAGNOSIS — Z872 Personal history of diseases of the skin and subcutaneous tissue: Secondary | ICD-10-CM | POA: Insufficient documentation

## 2014-10-15 DIAGNOSIS — Z3202 Encounter for pregnancy test, result negative: Secondary | ICD-10-CM | POA: Diagnosis not present

## 2014-10-15 DIAGNOSIS — R1010 Upper abdominal pain, unspecified: Secondary | ICD-10-CM | POA: Diagnosis present

## 2014-10-15 DIAGNOSIS — Z720 Tobacco use: Secondary | ICD-10-CM | POA: Insufficient documentation

## 2014-10-15 DIAGNOSIS — Z792 Long term (current) use of antibiotics: Secondary | ICD-10-CM | POA: Insufficient documentation

## 2014-10-15 DIAGNOSIS — Z7952 Long term (current) use of systemic steroids: Secondary | ICD-10-CM | POA: Diagnosis not present

## 2014-10-15 DIAGNOSIS — N730 Acute parametritis and pelvic cellulitis: Secondary | ICD-10-CM | POA: Diagnosis not present

## 2014-10-15 DIAGNOSIS — R197 Diarrhea, unspecified: Secondary | ICD-10-CM | POA: Diagnosis not present

## 2014-10-15 DIAGNOSIS — R112 Nausea with vomiting, unspecified: Secondary | ICD-10-CM | POA: Diagnosis not present

## 2014-10-15 LAB — COMPREHENSIVE METABOLIC PANEL
ALT: 9 U/L (ref 0–35)
AST: 13 U/L (ref 0–37)
Albumin: 4.3 g/dL (ref 3.5–5.2)
Alkaline Phosphatase: 70 U/L (ref 39–117)
Anion gap: 11 (ref 5–15)
BUN: 11 mg/dL (ref 6–23)
CO2: 26 mEq/L (ref 19–32)
Calcium: 9.4 mg/dL (ref 8.4–10.5)
Chloride: 105 mEq/L (ref 96–112)
Creatinine, Ser: 0.6 mg/dL (ref 0.50–1.10)
GFR calc Af Amer: >90 mL/min (ref >90)
GFR calc non Af Amer: >90 mL/min (ref >90)
Glucose, Bld: 79 mg/dL (ref 70–99)
Potassium: 3.9 mEq/L (ref 3.7–5.3)
Sodium: 142 mEq/L (ref 137–147)
Total Bilirubin: 0.6 mg/dL (ref 0.3–1.2)
Total Protein: 7.7 g/dL (ref 6.0–8.3)

## 2014-10-15 LAB — CBC WITH DIFFERENTIAL/PLATELET
Basophils Absolute: 0.1 10*3/uL (ref 0.0–0.1)
Basophils Relative: 1 % (ref 0–1)
Eosinophils Absolute: 0 10*3/uL (ref 0.0–0.7)
Eosinophils Relative: 1 % (ref 0–5)
HCT: 39.8 % (ref 36.0–46.0)
Hemoglobin: 13.6 g/dL (ref 12.0–15.0)
Lymphocytes Relative: 40 % (ref 12–46)
Lymphs Abs: 2 10*3/uL (ref 0.7–4.0)
MCH: 31.2 pg (ref 26.0–34.0)
MCHC: 34.2 g/dL (ref 30.0–36.0)
MCV: 91.3 fL (ref 78.0–100.0)
Monocytes Absolute: 0.4 10*3/uL (ref 0.1–1.0)
Monocytes Relative: 7 % (ref 3–12)
Neutro Abs: 2.5 10*3/uL (ref 1.7–7.7)
Neutrophils Relative %: 51 % (ref 43–77)
Platelets: 278 10*3/uL (ref 150–400)
RBC: 4.36 MIL/uL (ref 3.87–5.11)
RDW: 12.3 % (ref 11.5–15.5)
WBC: 4.9 10*3/uL (ref 4.0–10.5)

## 2014-10-15 LAB — URINALYSIS, ROUTINE W REFLEX MICROSCOPIC
Bilirubin Urine: NEGATIVE
Glucose, UA: NEGATIVE mg/dL
Hgb urine dipstick: NEGATIVE
Ketones, ur: 40 mg/dL — AB
Leukocytes, UA: NEGATIVE
Nitrite: NEGATIVE
Protein, ur: NEGATIVE mg/dL
Specific Gravity, Urine: 1.035 — ABNORMAL HIGH (ref 1.005–1.030)
Urobilinogen, UA: 1 mg/dL (ref 0.0–1.0)
pH: 6.5 (ref 5.0–8.0)

## 2014-10-15 LAB — WET PREP, GENITAL
TRICH WET PREP: NONE SEEN
Yeast Wet Prep HPF POC: NONE SEEN

## 2014-10-15 LAB — POC URINE PREG, ED: Preg Test, Ur: NEGATIVE

## 2014-10-15 LAB — LIPASE, BLOOD: Lipase: 23 U/L (ref 11–59)

## 2014-10-15 MED ORDER — GI COCKTAIL ~~LOC~~
30.0000 mL | Freq: Once | ORAL | Status: AC
  Administered 2014-10-15: 30 mL via ORAL
  Filled 2014-10-15: qty 30

## 2014-10-15 MED ORDER — DOXYCYCLINE HYCLATE 100 MG PO TABS
100.0000 mg | ORAL_TABLET | Freq: Once | ORAL | Status: AC
  Administered 2014-10-15: 100 mg via ORAL
  Filled 2014-10-15: qty 1

## 2014-10-15 MED ORDER — IOHEXOL 300 MG/ML  SOLN
25.0000 mL | INTRAMUSCULAR | Status: AC
  Administered 2014-10-15: 25 mL via ORAL

## 2014-10-15 MED ORDER — DEXTROSE 5 % IV SOLN
1.0000 g | Freq: Once | INTRAVENOUS | Status: AC
  Administered 2014-10-15: 1 g via INTRAVENOUS
  Filled 2014-10-15: qty 10

## 2014-10-15 MED ORDER — SODIUM CHLORIDE 0.9 % IV BOLUS (SEPSIS)
1000.0000 mL | Freq: Once | INTRAVENOUS | Status: AC
  Administered 2014-10-15: 1000 mL via INTRAVENOUS

## 2014-10-15 MED ORDER — IOHEXOL 300 MG/ML  SOLN
80.0000 mL | Freq: Once | INTRAMUSCULAR | Status: AC | PRN
  Administered 2014-10-15: 80 mL via INTRAVENOUS

## 2014-10-15 MED ORDER — HYDROMORPHONE HCL 1 MG/ML IJ SOLN
1.0000 mg | Freq: Once | INTRAMUSCULAR | Status: AC
  Administered 2014-10-15: 1 mg via INTRAVENOUS
  Filled 2014-10-15: qty 1

## 2014-10-15 MED ORDER — AZITHROMYCIN 250 MG PO TABS
1000.0000 mg | ORAL_TABLET | Freq: Once | ORAL | Status: AC
  Administered 2014-10-15: 1000 mg via ORAL
  Filled 2014-10-15: qty 4

## 2014-10-15 MED ORDER — MORPHINE SULFATE 4 MG/ML IJ SOLN
4.0000 mg | Freq: Once | INTRAMUSCULAR | Status: AC
  Administered 2014-10-15: 4 mg via INTRAVENOUS
  Filled 2014-10-15: qty 1

## 2014-10-15 MED ORDER — DOXYCYCLINE HYCLATE 100 MG PO CAPS: 100.0000 mg | ORAL_CAPSULE | Freq: Two times a day (BID) | ORAL | Status: DC

## 2014-10-15 MED ORDER — ONDANSETRON HCL 4 MG/2ML IJ SOLN
4.0000 mg | Freq: Once | INTRAMUSCULAR | Status: AC
  Administered 2014-10-15: 4 mg via INTRAVENOUS
  Filled 2014-10-15 (×2): qty 2

## 2014-10-15 MED ORDER — NAPROXEN 500 MG PO TABS: 500.0000 mg | ORAL_TABLET | Freq: Two times a day (BID) | ORAL | Status: DC

## 2014-10-15 NOTE — ED Notes (Addendum)
Pt presents with onset of L sided abdominal pain that radiates to suprapubic area, +vomiting x 4 with bright red blood noted, +diarrhea, pt unsure of blood in stool; denies any dysuria or vaginal discharge.  Pt reports she was to have IUD removed in August but reports "she couldn't find the string and it needs to come out".

## 2014-10-15 NOTE — Discharge Instructions (Signed)
Please follow up closely with [facility] to have your IUD removed if indicative.  Take antibiotic for the full duration.  Take pain medication as needed.  Return if you have any concerns.    Pelvic Inflammatory Disease Pelvic inflammatory disease (PID) refers to an infection in some or all of the female organs. The infection can be in the uterus, ovaries, fallopian tubes, or the surrounding tissues in the pelvis. PID can cause abdominal or pelvic pain that comes on suddenly (acute pelvic pain). PID is a serious infection because it can lead to lasting (chronic) pelvic pain or the inability to have children (infertile).  CAUSES  The infection is often caused by the normal bacteria found in the vaginal tissues. PID may also be caused by an infection that is spread during sexual contact. PID can also occur following:   The birth of a baby.   A miscarriage.   An abortion.   Major pelvic surgery.   The use of an intrauterine device (IUD).   A sexual assault.  RISK FACTORS Certain factors can put a person at higher risk for PID, such as:  Being younger than 25 years.  Being sexually active at [place] age.  Usingnonbarrier contraception.  Havingmultiple sexual partners.  Having sex with someone who has symptoms of a genital infection.  Using oral contraception. Other times, certain behaviors can increase the possibility of getting PID, such as:  Having sex during your period.  Using a vaginal douche.  Having an intrauterine device (IUD) in place. SYMPTOMS   Abdominal or pelvic pain.   Fever.   Chills.   Abnormal vaginal discharge.  Abnormal uterine bleeding.   Unusual pain shortly after finishing your period. DIAGNOSIS  Your caregiver will choose some of the following methods to make a diagnosis, such as:   Performinga physical exam and history. A pelvic exam typically reveals a very tender uterus and surrounding pelvis.   Ordering laboratory tests  including a pregnancy test, blood tests, and urine test.  Orderingcultures of the vagina and cervix to check for a sexually transmitted infection (STI).  Performing an ultrasound.   Performing a laparoscopic procedure to look inside the pelvis.  TREATMENT   Antibiotic medicines may be prescribed and taken by mouth.   Sexual partners may be treated when the infection is caused by a sexually transmitted disease (STD).   Hospitalization may be needed to give antibiotics intravenously.  Surgery may be needed, but this is rare. It may take weeks until you are completely well. If you are diagnosed with PID, you should also be checked for human immunodeficiency virus (HIV). HOME CARE INSTRUCTIONS   If given, take your antibiotics as directed. Finish the medicine even if you start to feel better.   Only take over-the-counter or prescription medicines for pain, discomfort, or fever as directed by your caregiver.   Do not have sexual intercourse until treatment is completed or as directed by your caregiver. If PID is confirmed, your recent sexual partner(s) will need treatment.   Keep your follow-up appointments. SEEK MEDICAL CARE IF:   You have increased or abnormal vaginal discharge.   You need prescription medicine for your pain.   You vomit.   You cannot take your medicines.   Your partner has an STD.  SEEK IMMEDIATE MEDICAL CARE IF:   You have a fever.   You have increased abdominal or pelvic pain.   You have chills.   You have pain when you urinate.   You  are not better after 72 hours following treatment.  MAKE SURE YOU:   Understand these instructions.  Will watch your condition.  Will get help right away if you are not doing well or get worse. Document Released: 12/17/2005 Document Revised: 04/13/2013 Document Reviewed: 12/13/2011 [facility] Patient Information 2015 [place], [place]. This information is not intended to replace advice given to  you by your health care provider. Make sure you discuss any questions you have with your health care provider.

## 2014-10-15 NOTE — ED Provider Notes (Signed)
Recurrent pelvic pain, awaits CT to r/o underlying abscess or infection.  If negative, pt to f/u with [facility], and d/c with pain meds and doxy for 14 days.    7:43 PM CT scan demonstrates finding that may suggest IVC thrombus, and PID less likely.  Recommend [place], which we will obtain for further evaluation.  I did consulted with OBGYN Dr. Nehemiah Settle who recommend d/c with rocephin/zithromax and doxy pt if [place] negative.    8:57 PM Ultrasounds of abdomen without evidence of IVC thrombosis. Therefore, patient will receive doxycycline, pain medication, and close followup with OB/GYN for further management.  BP 102/57  Pulse 54  Temp(Src) 97.9 F (36.6 C) (Oral)  Resp 13  Ht 5' (1.524 m)  Wt 120 lb (54.432 kg)  BMI 23.44 kg/m2  SpO2 100%  I have reviewed nursing notes and vital signs. I personally reviewed the imaging tests through PACS system  I reviewed available ER/hospitalization records thought the EMR  Results for orders placed during the hospital encounter of 10/15/14  WET PREP, GENITAL      Result Value Ref Range   Yeast Wet Prep HPF POC NONE SEEN  NONE SEEN   Trich, Wet Prep NONE SEEN  NONE SEEN   Clue Cells Wet Prep HPF POC FEW (*) NONE SEEN   WBC, Wet Prep HPF POC MODERATE (*) NONE SEEN  CBC WITH DIFFERENTIAL      Result Value Ref Range   WBC 4.9  4.0 - 10.5 K/uL   RBC 4.36  3.87 - 5.11 MIL/uL   Hemoglobin 13.6  12.0 - 15.0 g/dL   HCT 39.8  36.0 - 46.0 %   MCV 91.3  78.0 - 100.0 fL   MCH 31.2  26.0 - 34.0 pg   MCHC 34.2  30.0 - 36.0 g/dL   RDW 12.3  11.5 - 15.5 %   Platelets 278  150 - 400 K/uL   Neutrophils Relative % 51  43 - 77 %   Neutro Abs 2.5  1.7 - 7.7 K/uL   Lymphocytes Relative 40  12 - 46 %   Lymphs Abs 2.0  0.7 - 4.0 K/uL   Monocytes Relative 7  3 - 12 %   Monocytes Absolute 0.4  0.1 - 1.0 K/uL   Eosinophils Relative 1  0 - 5 %   Eosinophils Absolute 0.0  0.0 - 0.7 K/uL   Basophils Relative 1  0 - 1 %   Basophils Absolute 0.1  0.0 - 0.1 K/uL   COMPREHENSIVE METABOLIC PANEL      Result Value Ref Range   Sodium 142  137 - 147 mEq/L   Potassium 3.9  3.7 - 5.3 mEq/L   Chloride 105  96 - 112 mEq/L   CO2 26  19 - 32 mEq/L   Glucose, Bld 79  70 - 99 mg/dL   BUN 11  6 - 23 mg/dL   Creatinine, Ser 0.60  0.50 - 1.10 mg/dL   Calcium 9.4  8.4 - 10.5 mg/dL   Total Protein 7.7  6.0 - 8.3 g/dL   Albumin 4.3  3.5 - 5.2 g/dL   AST 13  0 - 37 U/L   ALT 9  0 - 35 U/L   Alkaline Phosphatase 70  39 - 117 U/L   Total Bilirubin 0.6  0.3 - 1.2 mg/dL   GFR calc non Af Amer >90  >90 mL/min   GFR calc Af Amer >90  >90 mL/min   Anion gap  11  5 - 15  URINALYSIS, ROUTINE W REFLEX MICROSCOPIC      Result Value Ref Range   Color, Urine YELLOW  YELLOW   APPearance CLEAR  CLEAR   Specific Gravity, Urine 1.035 (*) 1.005 - 1.030   pH 6.5  5.0 - 8.0   Glucose, UA NEGATIVE  NEGATIVE mg/dL   Hgb urine dipstick NEGATIVE  NEGATIVE   Bilirubin Urine NEGATIVE  NEGATIVE   Ketones, ur 40 (*) NEGATIVE mg/dL   Protein, ur NEGATIVE  NEGATIVE mg/dL   Urobilinogen, UA 1.0  0.0 - 1.0 mg/dL   Nitrite NEGATIVE  NEGATIVE   Leukocytes, UA NEGATIVE  NEGATIVE  LIPASE, BLOOD      Result Value Ref Range   Lipase 23  11 - 59 U/L  POC URINE PREG, ED      Result Value Ref Range   Preg Test, Ur NEGATIVE  NEGATIVE   US Transvaginal Non-ob  10/15/2014   CLINICAL DATA:  Pelvic pain, worse on the right. The patient has an IUD.  EXAM: TRANSABDOMINAL AND TRANSVAGINAL ULTRASOUND OF PELVIS  TECHNIQUE: Both transabdominal and transvaginal ultrasound examinations of the pelvis were performed. Transabdominal technique was performed for global imaging of the pelvis including uterus, ovaries, adnexal regions, and pelvic cul-de-sac. It was necessary to proceed with endovaginal exam following the transabdominal exam to visualize the endometrium and adnexa.  COMPARISON:  Pelvic ultrasound 07/04/2014.  FINDINGS: Uterus  Measurements: 9.0 x 3.7 x 6.4 cm. No fibroids or other mass visualized.   Endometrium  Thickness: 0.3 cm.  IUD is in place and unremarkable.  Right ovary  Measurements: 3.3 x 2.5 x 2.4 cm. Normal appearance/no adnexal mass.  Left ovary  Measurements: 2.8 x 1.6 x 1.8 cm. Normal appearance/no adnexal mass.  Other findings  Trace amount of free pelvic fluid is compatible with physiologic change.  IMPRESSION: No acute finding or finding to explain the patient's symptoms. IUD is in place and unremarkable.   Electronically Signed   By: Inge Rise M.D.   On: 10/15/2014 16:37   US Pelvis Complete  10/15/2014   CLINICAL DATA:  Pelvic pain, worse on the right. The patient has an IUD.  EXAM: TRANSABDOMINAL AND TRANSVAGINAL ULTRASOUND OF PELVIS  TECHNIQUE: Both transabdominal and transvaginal ultrasound examinations of the pelvis were performed. Transabdominal technique was performed for global imaging of the pelvis including uterus, ovaries, adnexal regions, and pelvic cul-de-sac. It was necessary to proceed with endovaginal exam following the transabdominal exam to visualize the endometrium and adnexa.  COMPARISON:  Pelvic ultrasound 07/04/2014.  FINDINGS: Uterus  Measurements: 9.0 x 3.7 x 6.4 cm. No fibroids or other mass visualized.  Endometrium  Thickness: 0.3 cm.  IUD is in place and unremarkable.  Right ovary  Measurements: 3.3 x 2.5 x 2.4 cm. Normal appearance/no adnexal mass.  Left ovary  Measurements: 2.8 x 1.6 x 1.8 cm. Normal appearance/no adnexal mass.  Other findings  Trace amount of free pelvic fluid is compatible with physiologic change.  IMPRESSION: No acute finding or finding to explain the patient's symptoms. IUD is in place and unremarkable.   Electronically Signed   By: Inge Rise M.D.   On: 10/15/2014 16:37   Ct Abdomen Pelvis W Contrast  10/15/2014   CLINICAL DATA:  Lower abdominal pain 1 week with nausea, vomiting and diarrhea today.  EXAM: CT ABDOMEN AND PELVIS WITH CONTRAST  TECHNIQUE: Multidetector CT imaging of the abdomen and pelvis was performed  using the standard protocol following bolus administration  of intravenous contrast.  CONTRAST:  40mL OMNIPAQUE IOHEXOL 300 MG/ML  SOLN  COMPARISON:  Pelvic ultrasound same day.  FINDINGS: Lung bases are normal.  Abdominal images demonstrate a normal spleen, pancreas, gallbladder and adrenal glands. The appendix is normal. Kidneys are normal. Vascular structures and bowel are within normal. There is subtle patchy decreased liver perfusion with heterogeneous opacification of the IVC which may be due to thrombus versus pseudothrombus.  Pelvic images demonstrate an IUD in adequate position. Uterus appears normal in size as there is diffuse low density of the myometrium. Ovaries are unremarkable. There is free fluid within the pelvis with ill definition of the wall of the lower uterine segment and cervix/vagina. There are prominent periuterine venous structures left greater than right. These findings in the pelvis under nonspecific and may be due to an infectious/inflammatory process as cannot exclude pelvic inflammatory disease. However these findings may be due to pelvic venous congestion syndrome. There is minimal rectal wall thickening which may be secondary to pelvic inflammatory disease or pelvic venous congestion. Remaining pelvic structures are unremarkable.  IMPRESSION: Nonspecific findings within the pelvis as described to include free fluid as well as mild edematous changes of the uterus/cervix and vaginal wall as well as adjacent rectum. There are prominent periuterine venous structures left worse than right as well as heterogeneous opacification of the IVC above the level of the renal veins with somewhat patchy decreased perfusion of the liver. These findings may all be the result of pelvic venous congestion syndrome possibly due to thrombus within the IVC. Pelvic inflammatory disease is less likely. Ultrasound to evaluate the IVC may be helpful.  These results were called by telephone at the time of  interpretation on 10/15/2014 at 7:20 pm to Dr. Jeannett Senior , who verbally acknowledged these results.   Electronically Signed   By: Marin Olp M.D.   On: 10/15/2014 19:21   US Abdomen Limited  10/15/2014   CLINICAL DATA:  Evaluate for thrombus within the IVC.  EXAM: LIMITED ABDOMINAL ULTRASOUND  COMPARISON:  10/15/2014  FINDINGS: The inferior vena cava is patent. There is no evidence for thrombus. No evidence for hepatic vein thrombosis.  IMPRESSION: Negative exam.  No evidence for IVC thrombosis.   Electronically Signed   By: Kerby Moors M.D.   On: 10/15/2014 20:50      Domenic Moras, PA-C 10/15/14 2057

## 2014-10-15 NOTE — ED Provider Notes (Signed)
CSN: [id number]     Arrival date & time 10/15/14  1049 History   First MD Initiated Contact with Patient 10/15/14 1136     No chief complaint on file.    (Consider location/radiation/quality/duration/timing/severity/associated sxs/prior Treatment) HPI Ruth Daniels is a 18 y.o. female who presents to ED complaining of upper abdominal pain, nausea, vomiting, hematoemisis. States also has vaginal bleeding, which she does not think is her period. States was told her IUD needs to come out but she has not gotten around to it. States was told that they could not visualize strings. Pt states vaginal bleeding for 1 week intermittently with some cramping. States upper abdominal pain, vomiting blood since this morning. States has had 4 episodes of emesis. Pt also states she has had diarrhea, not sure if there was any blood in stool. Denies dysuria, vaginal discharge. No medications tried. Pt denies alcohol drinking. Occasional use of ibuprofen.   Past Medical History  Diagnosis Date  . Allergy     Penicillin  . Eczema   . Chlamydia trachomatis infection in pregnancy   . GBS (group B streptococcus) UTI complicating pregnancy 6/56/8127   Past Surgical History  Procedure Laterality Date  . Foot surgery     Family History  Problem Relation Age of Onset  . Depression Mother    History  Substance Use Topics  . Smoking status: Current Every Day Smoker -- 0.25 packs/day    Types: Cigarettes  . Smokeless tobacco: Never Used  . Alcohol Use: No   OB History   Grav Para Term Preterm Abortions TAB SAB Ect Mult Living   1 1 1       1      Review of Systems  Constitutional: Positive for fatigue. Negative for fever and chills.  Respiratory: Negative for cough, chest tightness and shortness of breath.   Cardiovascular: Negative for chest pain, palpitations and leg swelling.  Gastrointestinal: Positive for nausea, vomiting, abdominal pain and diarrhea. Negative for abdominal distention.   Genitourinary: Positive for vaginal bleeding and pelvic pain. Negative for dysuria, flank pain, vaginal discharge and vaginal pain.  Musculoskeletal: Negative for arthralgias, myalgias, neck pain and neck stiffness.  Skin: Negative for rash.  Neurological: Positive for weakness. Negative for dizziness and headaches.  All other systems reviewed and are negative.     Allergies  Latex and Penicillins  Home Medications   Prior to Admission medications   Medication Sig Start Date End Date Taking? Authorizing Provider  doxycycline (VIBRAMYCIN) 100 MG capsule Take 1 capsule (100 mg total) by mouth 2 (two) times daily. 07/26/14   Taft, PA-C  fluticasone (FLONASE) 50 MCG/ACT nasal spray Place 1 spray into both nostrils daily. 09/05/14   Courtney A Forcucci, PA-C  guaiFENesin (ROBITUSSIN) 100 MG/5ML liquid Take 5-10 mLs (100-200 mg total) by mouth every 4 (four) hours as needed for cough. 09/05/14   Courtney A Forcucci, PA-C  ibuprofen (ADVIL,MOTRIN) 800 MG tablet Take 1 tablet (800 mg total) by mouth 3 (three) times daily. 09/05/14   Courtney A Forcucci, PA-C  levonorgestrel (MIRENA) 20 MCG/24HR IUD 1 each by Intrauterine route once.    Historical Provider, MD   BP 126/55  Pulse 54  Temp(Src) 97.9 F (36.6 C) (Oral)  Resp 16  Ht 5' (1.524 m)  Wt 120 lb (54.432 kg)  BMI 23.44 kg/m2  SpO2 100% Physical Exam  Nursing note and vitals reviewed. Constitutional: She is oriented to person, place, and time. She appears well-developed and well-nourished. No distress.  HENT:  Head: Normocephalic.  Eyes: Conjunctivae are normal.  Neck: Neck supple.  Cardiovascular: Normal rate, regular rhythm and normal heart sounds.   Pulmonary/Chest: Effort normal and breath sounds normal. No respiratory distress. She has no wheezes. She has no rales.  Abdominal: Soft. Bowel sounds are normal. She exhibits no distension. There is tenderness. There is no rebound.  RUQ, LUQ, epigastric tenderness   Genitourinary:  Normal external genitalia. Thick yellow vaginal discharge, cervix erythematous, friable, +CMT, + uterine tenderness. No adnexal tenderness  Musculoskeletal: She exhibits no edema.  Neurological: She is alert and oriented to person, place, and time.  Skin: Skin is warm and dry.  Psychiatric: She has a normal mood and affect. Her behavior is normal.    ED Course  Procedures (including critical care time) Labs Review Labs Reviewed  WET PREP, GENITAL - Abnormal; Notable for the following:    Clue Cells Wet Prep HPF POC FEW (*)    WBC, Wet Prep HPF POC MODERATE (*)    All other components within normal limits  URINALYSIS, ROUTINE W REFLEX MICROSCOPIC - Abnormal; Notable for the following:    Specific Gravity, Urine 1.035 (*)    Ketones, ur 40 (*)    All other components within normal limits  GC/CHLAMYDIA PROBE AMP  CBC WITH DIFFERENTIAL  COMPREHENSIVE METABOLIC PANEL  LIPASE, BLOOD  POC URINE PREG, ED    Imaging Review US Transvaginal Non-ob  10/15/2014   CLINICAL DATA:  Pelvic pain, worse on the right. The patient has an IUD.  EXAM: TRANSABDOMINAL AND TRANSVAGINAL ULTRASOUND OF PELVIS  TECHNIQUE: Both transabdominal and transvaginal ultrasound examinations of the pelvis were performed. Transabdominal technique was performed for global imaging of the pelvis including uterus, ovaries, adnexal regions, and pelvic cul-de-sac. It was necessary to proceed with endovaginal exam following the transabdominal exam to visualize the endometrium and adnexa.  COMPARISON:  Pelvic ultrasound 07/04/2014.  FINDINGS: Uterus  Measurements: 9.0 x 3.7 x 6.4 cm. No fibroids or other mass visualized.  Endometrium  Thickness: 0.3 cm.  IUD is in place and unremarkable.  Right ovary  Measurements: 3.3 x 2.5 x 2.4 cm. Normal appearance/no adnexal mass.  Left ovary  Measurements: 2.8 x 1.6 x 1.8 cm. Normal appearance/no adnexal mass.  Other findings  Trace amount of free pelvic fluid is compatible  with physiologic change.  IMPRESSION: No acute finding or finding to explain the patient's symptoms. IUD is in place and unremarkable.   Electronically Signed   By: Inge Rise M.D.   On: 10/15/2014 16:37   US Pelvis Complete  10/15/2014   CLINICAL DATA:  Pelvic pain, worse on the right. The patient has an IUD.  EXAM: TRANSABDOMINAL AND TRANSVAGINAL ULTRASOUND OF PELVIS  TECHNIQUE: Both transabdominal and transvaginal ultrasound examinations of the pelvis were performed. Transabdominal technique was performed for global imaging of the pelvis including uterus, ovaries, adnexal regions, and pelvic cul-de-sac. It was necessary to proceed with endovaginal exam following the transabdominal exam to visualize the endometrium and adnexa.  COMPARISON:  Pelvic ultrasound 07/04/2014.  FINDINGS: Uterus  Measurements: 9.0 x 3.7 x 6.4 cm. No fibroids or other mass visualized.  Endometrium  Thickness: 0.3 cm.  IUD is in place and unremarkable.  Right ovary  Measurements: 3.3 x 2.5 x 2.4 cm. Normal appearance/no adnexal mass.  Left ovary  Measurements: 2.8 x 1.6 x 1.8 cm. Normal appearance/no adnexal mass.  Other findings  Trace amount of free pelvic fluid is compatible with physiologic change.  IMPRESSION: No acute  finding or finding to explain the patient's symptoms. IUD is in place and unremarkable.   Electronically Signed   By: Inge Rise M.D.   On: 10/15/2014 16:37     EKG Interpretation None      MDM   Final diagnoses:  PID (acute pelvic inflammatory disease)    Pt with pelvic pain and epigastric pain. Hemataemesis. Will get labs, pelvic exam, ua.   4:46 PM Pt's labs all normal. No elevating of WBC. Pelvic exam is howerever showing copious yellow discharge, with friable irritated cervix. Pt recently treated for PID - 2 months ago. Negative gc/chlamydia. US obtained, negative. Suspect maybe related to infected IUD. No strings visualized on exam. Will need GYN follow up for removal ASAP. This  discussed with pt. Given persistent severe pain and tenderness will get CT abd/pelvis. Pt states currently pain 12/10. More pain medications ordered.   Filed Vitals:   10/15/14 1430 10/15/14 1445 10/15/14 1515 10/15/14 1618  BP: 112/91 104/53 115/76 106/49  Pulse: 57 57 75 55  Temp:      TempSrc:      Resp: 19 12 14 18   Height:      Weight:      SpO2: 100% 99% 100% 100%   Pt signed at shift change pending CT abd/pelvis. If CT negative Plan d/c home with doxycycline, pain meds, follow up with GYN  [place], PA-C 10/18/14 1331

## 2014-10-15 NOTE — ED Notes (Signed)
Patient transported to Ultrasound 

## 2014-10-15 NOTE — ED Notes (Signed)
Patient back from US.

## 2014-10-16 LAB — GC/CHLAMYDIA PROBE AMP
CT PROBE, AMP APTIMA: NEGATIVE
GC Probe RNA: NEGATIVE

## 2014-10-16 NOTE — ED Provider Notes (Signed)
Medical screening examination/treatment/procedure(s) were performed by non-physician practitioner and as supervising physician I was immediately available for consultation/collaboration.  Ernestina Patches, MD 10/16/14 [phone]

## 2014-10-17 ENCOUNTER — Encounter (HOSPITAL_COMMUNITY): Payer: Self-pay | Admitting: Emergency Medicine

## 2014-10-17 ENCOUNTER — Emergency Department (HOSPITAL_COMMUNITY)
Admission: EM | Admit: 2014-10-17 | Discharge: 2014-10-17 | Disposition: A | Discharge status: Home / Self Care | Payer: Medicaid Other | Attending: Emergency Medicine | Admitting: Emergency Medicine

## 2014-10-17 DIAGNOSIS — Z91040 Latex allergy status: Secondary | ICD-10-CM | POA: Insufficient documentation

## 2014-10-17 DIAGNOSIS — R1110 Vomiting, unspecified: Secondary | ICD-10-CM | POA: Insufficient documentation

## 2014-10-17 DIAGNOSIS — Z791 Long term (current) use of non-steroidal anti-inflammatories (NSAID): Secondary | ICD-10-CM | POA: Diagnosis not present

## 2014-10-17 DIAGNOSIS — R220 Localized swelling, mass and lump, head: Secondary | ICD-10-CM | POA: Diagnosis not present

## 2014-10-17 DIAGNOSIS — T7840XA Allergy, unspecified, initial encounter: Secondary | ICD-10-CM

## 2014-10-17 DIAGNOSIS — R21 Rash and other nonspecific skin eruption: Secondary | ICD-10-CM | POA: Diagnosis present

## 2014-10-17 DIAGNOSIS — T360X5A Adverse effect of penicillins, initial encounter: Secondary | ICD-10-CM | POA: Insufficient documentation

## 2014-10-17 DIAGNOSIS — Y9289 Other specified places as the place of occurrence of the external cause: Secondary | ICD-10-CM | POA: Insufficient documentation

## 2014-10-17 DIAGNOSIS — Y9389 Activity, other specified: Secondary | ICD-10-CM | POA: Insufficient documentation

## 2014-10-17 DIAGNOSIS — Z880 Allergy status to penicillin: Secondary | ICD-10-CM | POA: Insufficient documentation

## 2014-10-17 DIAGNOSIS — Z720 Tobacco use: Secondary | ICD-10-CM | POA: Insufficient documentation

## 2014-10-17 DIAGNOSIS — R197 Diarrhea, unspecified: Secondary | ICD-10-CM | POA: Diagnosis not present

## 2014-10-17 DIAGNOSIS — Z79899 Other long term (current) drug therapy: Secondary | ICD-10-CM | POA: Diagnosis not present

## 2014-10-17 DIAGNOSIS — Z792 Long term (current) use of antibiotics: Secondary | ICD-10-CM | POA: Insufficient documentation

## 2014-10-17 MED ORDER — PREDNISONE 20 MG PO TABS
60.0000 mg | ORAL_TABLET | Freq: Once | ORAL | Status: AC
  Administered 2014-10-17: 60 mg via ORAL
  Filled 2014-10-17: qty 3

## 2014-10-17 MED ORDER — PREDNISONE (PAK) 10 MG PO TABS: ORAL_TABLET | Freq: Every day | ORAL | Status: DC

## 2014-10-17 MED ORDER — DIPHENHYDRAMINE HCL 25 MG PO TABS: 25.0000 mg | ORAL_TABLET | Freq: Four times a day (QID) | ORAL | Status: DC

## 2014-10-17 MED ORDER — FAMOTIDINE 20 MG PO TABS: 20.0000 mg | ORAL_TABLET | Freq: Two times a day (BID) | ORAL | Status: DC

## 2014-10-17 MED ORDER — FAMOTIDINE 20 MG PO TABS
20.0000 mg | ORAL_TABLET | Freq: Once | ORAL | Status: AC
  Administered 2014-10-17: 20 mg via ORAL
  Filled 2014-10-17: qty 1

## 2014-10-17 MED ORDER — DIPHENHYDRAMINE HCL 25 MG PO CAPS
25.0000 mg | ORAL_CAPSULE | Freq: Once | ORAL | Status: AC
  Administered 2014-10-17: 25 mg via ORAL
  Filled 2014-10-17: qty 1

## 2014-10-17 MED ORDER — DOXYCYCLINE HYCLATE 100 MG PO TABS: 100.0000 mg | ORAL_TABLET | Freq: Once | ORAL | Status: DC

## 2014-10-17 NOTE — ED Notes (Signed)
Declined W/C at D/C and was escorted to lobby by RN. 

## 2014-10-17 NOTE — Discharge Instructions (Signed)
Read the information below.  Use the prescribed medication as directed.  Please discuss all new medications with your pharmacist.  You may return to the Emergency Department at any time for worsening condition or any new symptoms that concern you.  If you develop swelling in your mouth or throat, difficulty swallowing or breathing, or you are unable to tolerate fluids by mouth, return to the ER immediately for a recheck.      Allergies Allergies may happen from anything your body is sensitive to. This may be food, medicines, pollens, chemicals, and nearly anything around you in everyday life that produces allergens. An allergen is anything that causes an allergy producing substance. Heredity is often a factor in causing these problems. This means you may have some of the same allergies as your parents. Food allergies happen in all age groups. Food allergies are some of the most severe and life threatening. Some common food allergies are cow's milk, seafood, eggs, nuts, wheat, and soybeans. SYMPTOMS   Swelling around the mouth.  An itchy red rash or hives.  Vomiting or diarrhea.  Difficulty breathing. SEVERE ALLERGIC REACTIONS ARE LIFE-THREATENING. This reaction is called anaphylaxis. It can cause the mouth and throat to swell and cause difficulty with breathing and swallowing. In severe reactions only a trace amount of food (for example, peanut oil in a salad) may cause death within seconds. Seasonal allergies occur in all age groups. These are seasonal because they usually occur during the same season every year. They may be a reaction to molds, grass pollens, or tree pollens. Other causes of problems are house dust mite allergens, pet dander, and mold spores. The symptoms often consist of nasal congestion, a runny itchy nose associated with sneezing, and tearing itchy eyes. There is often an associated itching of the mouth and ears. The problems happen when you come in contact with pollens and  other allergens. Allergens are the particles in the air that the body reacts to with an allergic reaction. This causes you to release allergic antibodies. Through a chain of events, these eventually cause you to release histamine into the blood stream. Although it is meant to be protective to the body, it is this release that causes your discomfort. This is why you were given anti-histamines to feel better. If you are unable to pinpoint the offending allergen, it may be determined by skin or blood testing. Allergies cannot be cured but can be controlled with medicine. Hay fever is a collection of all or some of the seasonal allergy problems. It may often be treated with simple over-the-counter medicine such as diphenhydramine. Take medicine as directed. Do not drink alcohol or drive while taking this medicine. Check with your caregiver or package insert for child dosages. If these medicines are not effective, there are many new medicines your caregiver can prescribe. Stronger medicine such as nasal spray, eye drops, and corticosteroids may be used if the first things you try do not work well. Other treatments such as immunotherapy or desensitizing injections can be used if all else fails. Follow up with your caregiver if problems continue. These seasonal allergies are usually not life threatening. They are generally more of a nuisance that can often be handled using medicine. HOME CARE INSTRUCTIONS   If unsure what causes a reaction, keep a diary of foods eaten and symptoms that follow. Avoid foods that cause reactions.  If hives or rash are present:  Take medicine as directed.  You may use an over-the-counter antihistamine (diphenhydramine)  for hives and itching as needed.  Apply cold compresses (cloths) to the skin or take baths in cool water. Avoid hot baths or showers. Heat will make a rash and itching worse.  If you are severely allergic:  Following a treatment for a severe reaction,  hospitalization is often required for closer follow-up.  Wear a medic-alert bracelet or necklace stating the allergy.  You and your family must learn how to give adrenaline or use an anaphylaxis kit.  If you have had a severe reaction, always carry your anaphylaxis kit or EpiPen with you. Use this medicine as directed by your caregiver if a severe reaction is occurring. Failure to do so could have a fatal outcome. SEEK MEDICAL CARE IF:  You suspect a food allergy. Symptoms generally happen within 30 minutes of eating a food.  Your symptoms have not gone away within 2 days or are getting worse.  You develop new symptoms.  You want to retest yourself or your child with a food or drink you think causes an allergic reaction. Never do this if an anaphylactic reaction to that food or drink has happened before. Only do this under the care of a caregiver. SEEK IMMEDIATE MEDICAL CARE IF:   You have difficulty breathing, are wheezing, or have a tight feeling in your chest or throat.  You have a swollen mouth, or you have hives, swelling, or itching all over your body.  You have had a severe reaction that has responded to your anaphylaxis kit or an EpiPen. These reactions may return when the medicine has worn off. These reactions should be considered life threatening. MAKE SURE YOU:   Understand these instructions.  Will watch your condition.  Will get help right away if you are not doing well or get worse. Document Released: 03/12/2003 Document Revised: 04/13/2013 Document Reviewed: 08/16/2008 [facility] Patient Information 2015 [place], [place]. This information is not intended to replace advice given to you by your health care provider. Make sure you discuss any questions you have with your health care provider.

## 2014-10-17 NOTE — ED Provider Notes (Signed)
Medical screening examination/treatment/procedure(s) were performed by non-physician practitioner and as supervising physician I was immediately available for consultation/collaboration.   EKG Interpretation None        Blanchie Dessert, MD 10/17/14 1604

## 2014-10-17 NOTE — ED Provider Notes (Signed)
CSN: [id number]     Arrival date & time 10/17/14  1310 History  This chart was scribed for Ruth Bibles, PA-C, working with Blanchie Dessert, MD by Steva Colder, ED Scribe. The patient was seen in room TR06C/TR06C at 2:29 PM.      Chief Complaint  Patient presents with  . Allergic Reaction    The history is provided by the patient and a parent. No language interpreter was used.   Ruth Daniels is a 18 y.o. female who presents today complaining of allergic reaction onset yesterday. Yesterday noticed her face was itching.  She states that she went to bed last night, woke up with the feeling of a "heavy face." She states that she looked in the mirror and saw that her face was swelling.Her face also itches, burns, and hurts.  She states that she has not started the Rx medications that were given to her Friday yet. Mother states that the she suggested Benadryl to the pt and the pt refused to take any. The pt refused to take any because she didn't want anything to interfere with her facial swelling. Denies itching or swelling of her mouth or throat, rash, difficulty swallowing or breathing.    Pt was seen on 10/16 in the ED and was treated for PID. She states that her pain has not changed from the PID since her initial visit. She states that the only thing that will make her feel better is if she gets her contraceptive removed.  As it is the weekend she has not yet called to follow up with gynecology.    During her ED visit on the 10/15/14 she was given: Morphine, Zithromax, Rocephin, Doxycyline, Dilaudid, GI cocktail, Zofran and IV contrast.   She is allergic to penicillin with reaction of rash and throat swelling.   Past Medical History  Diagnosis Date  . Allergy     Penicillin  . Eczema   . Chlamydia trachomatis infection in pregnancy   . GBS (group B streptococcus) UTI complicating pregnancy 5/70/1779   Past Surgical History  Procedure Laterality Date  . Foot surgery     Family  History  Problem Relation Age of Onset  . Depression Mother    History  Substance Use Topics  . Smoking status: Current Every Day Smoker -- 0.25 packs/day    Types: Cigarettes  . Smokeless tobacco: Never Used  . Alcohol Use: No   OB History   Grav Para Term Preterm Abortions TAB SAB Ect Mult Living   1 1 1       1      Review of Systems  HENT: Positive for facial swelling. Negative for trouble swallowing.        No tongue swelling.   Respiratory:       No trouble breathing  Gastrointestinal: Positive for vomiting, abdominal pain and diarrhea.       Abdominal pain, GI symptoms unchanged from previous visit.   All other systems reviewed and are negative.     Allergies  Latex and Penicillins  Home Medications   Prior to Admission medications   Medication Sig Start Date End Date Taking? Authorizing Provider  doxycycline (VIBRAMYCIN) 100 MG capsule Take 1 capsule (100 mg total) by mouth 2 (two) times daily. One po bid x 10 days 10/15/14   Domenic Moras, PA-C  levonorgestrel (MIRENA) 20 MCG/24HR IUD 1 each by Intrauterine route once.    Historical Provider, MD  naproxen (NAPROSYN) 500 MG tablet Take 1 tablet (500 mg  total) by mouth 2 (two) times daily. 10/15/14   Domenic Moras, PA-C   BP 115/74  Pulse 82  Temp(Src) 98.8 F (37.1 C) (Oral)  Resp 16  SpO2 100%  Physical Exam  Nursing note and vitals reviewed. Constitutional: She appears well-developed and well-nourished. No distress.  HENT:  Head: Normocephalic and atraumatic.  Mouth/Throat: Oropharynx is clear and moist.  Diffused facial edema. No lesions. No swelling of the oropharynx.   Neck: Neck supple.  Cardiovascular: Normal rate, regular rhythm and normal heart sounds.   Pulmonary/Chest: Effort normal and breath sounds normal. No stridor. No respiratory distress. She has no wheezes. She has no rales.  Abdominal: Soft. She exhibits no distension. There is tenderness. There is no rebound and no guarding.  Diffused  tenderness  Neurological: She is alert.  Skin: No rash noted. She is not diaphoretic.    ED Course  Procedures (including critical care time) DIAGNOSTIC STUDIES: Oxygen Saturation is 100% on room air, normal by my interpretation.    COORDINATION OF CARE: 2:43 PM-Discussed treatment plan which includes Prednisone and Benadryl with pt at bedside and pt agreed to plan.   Labs Review Labs Reviewed - No data to display  Imaging Review US Transvaginal Non-ob  10/15/2014   CLINICAL DATA:  Pelvic pain, worse on the right. The patient has an IUD.  EXAM: TRANSABDOMINAL AND TRANSVAGINAL ULTRASOUND OF PELVIS  TECHNIQUE: Both transabdominal and transvaginal ultrasound examinations of the pelvis were performed. Transabdominal technique was performed for global imaging of the pelvis including uterus, ovaries, adnexal regions, and pelvic cul-de-sac. It was necessary to proceed with endovaginal exam following the transabdominal exam to visualize the endometrium and adnexa.  COMPARISON:  Pelvic ultrasound 07/04/2014.  FINDINGS: Uterus  Measurements: 9.0 x 3.7 x 6.4 cm. No fibroids or other mass visualized.  Endometrium  Thickness: 0.3 cm.  IUD is in place and unremarkable.  Right ovary  Measurements: 3.3 x 2.5 x 2.4 cm. Normal appearance/no adnexal mass.  Left ovary  Measurements: 2.8 x 1.6 x 1.8 cm. Normal appearance/no adnexal mass.  Other findings  Trace amount of free pelvic fluid is compatible with physiologic change.  IMPRESSION: No acute finding or finding to explain the patient's symptoms. IUD is in place and unremarkable.   Electronically Signed   By: Inge Rise M.D.   On: 10/15/2014 16:37   US Pelvis Complete  10/15/2014   CLINICAL DATA:  Pelvic pain, worse on the right. The patient has an IUD.  EXAM: TRANSABDOMINAL AND TRANSVAGINAL ULTRASOUND OF PELVIS  TECHNIQUE: Both transabdominal and transvaginal ultrasound examinations of the pelvis were performed. Transabdominal technique was performed  for global imaging of the pelvis including uterus, ovaries, adnexal regions, and pelvic cul-de-sac. It was necessary to proceed with endovaginal exam following the transabdominal exam to visualize the endometrium and adnexa.  COMPARISON:  Pelvic ultrasound 07/04/2014.  FINDINGS: Uterus  Measurements: 9.0 x 3.7 x 6.4 cm. No fibroids or other mass visualized.  Endometrium  Thickness: 0.3 cm.  IUD is in place and unremarkable.  Right ovary  Measurements: 3.3 x 2.5 x 2.4 cm. Normal appearance/no adnexal mass.  Left ovary  Measurements: 2.8 x 1.6 x 1.8 cm. Normal appearance/no adnexal mass.  Other findings  Trace amount of free pelvic fluid is compatible with physiologic change.  IMPRESSION: No acute finding or finding to explain the patient's symptoms. IUD is in place and unremarkable.   Electronically Signed   By: Inge Rise M.D.   On: 10/15/2014 16:37  Ct Abdomen Pelvis W Contrast  10/15/2014   CLINICAL DATA:  Lower abdominal pain 1 week with nausea, vomiting and diarrhea today.  EXAM: CT ABDOMEN AND PELVIS WITH CONTRAST  TECHNIQUE: Multidetector CT imaging of the abdomen and pelvis was performed using the standard protocol following bolus administration of intravenous contrast.  CONTRAST:  77mL OMNIPAQUE IOHEXOL 300 MG/ML  SOLN  COMPARISON:  Pelvic ultrasound same day.  FINDINGS: Lung bases are normal.  Abdominal images demonstrate a normal spleen, pancreas, gallbladder and adrenal glands. The appendix is normal. Kidneys are normal. Vascular structures and bowel are within normal. There is subtle patchy decreased liver perfusion with heterogeneous opacification of the IVC which may be due to thrombus versus pseudothrombus.  Pelvic images demonstrate an IUD in adequate position. Uterus appears normal in size as there is diffuse low density of the myometrium. Ovaries are unremarkable. There is free fluid within the pelvis with ill definition of the wall of the lower uterine segment and cervix/vagina. There  are prominent periuterine venous structures left greater than right. These findings in the pelvis under nonspecific and may be due to an infectious/inflammatory process as cannot exclude pelvic inflammatory disease. However these findings may be due to pelvic venous congestion syndrome. There is minimal rectal wall thickening which may be secondary to pelvic inflammatory disease or pelvic venous congestion. Remaining pelvic structures are unremarkable.  IMPRESSION: Nonspecific findings within the pelvis as described to include free fluid as well as mild edematous changes of the uterus/cervix and vaginal wall as well as adjacent rectum. There are prominent periuterine venous structures left worse than right as well as heterogeneous opacification of the IVC above the level of the renal veins with somewhat patchy decreased perfusion of the liver. These findings may all be the result of pelvic venous congestion syndrome possibly due to thrombus within the IVC. Pelvic inflammatory disease is less likely. Ultrasound to evaluate the IVC may be helpful.  These results were called by telephone at the time of interpretation on 10/15/2014 at 7:20 pm to Dr. Jeannett Senior , who verbally acknowledged these results.   Electronically Signed   By: Marin Olp M.D.   On: 10/15/2014 19:21   US Abdomen Limited  10/15/2014   CLINICAL DATA:  Evaluate for thrombus within the IVC.  EXAM: LIMITED ABDOMINAL ULTRASOUND  COMPARISON:  10/15/2014  FINDINGS: The inferior vena cava is patent. There is no evidence for thrombus. No evidence for hepatic vein thrombosis.  IMPRESSION: Negative exam.  No evidence for IVC thrombosis.   Electronically Signed   By: Kerby Moors M.D.   On: 10/15/2014 20:50     EKG Interpretation None      MDM   Final diagnoses:  Allergic reaction, initial encounter   Afebrile, nontoxic patient with facial swelling and itching.  No airway symptoms or concerns.  Pt has allergy to penicillin, not  given penicillin family medications at recent ED visit but was given multiple medications and several antibiotics.  Unclear what might have caused this reaction.  Pt encouraged to follow up as previously directed.  No change in her abdominal symptoms since she was seen in ED two days ago with extensive workup.  Pt given PO prednisone, benadryl, pepcid in ED.   D/C home with same medications, gyn, allergist follow up.  Discussed  findings, treatment, and follow up  with patient.  Pt given return precautions.  Pt verbalizes understanding and agrees with plan.       I personally performed the services  described in this documentation, which was scribed in my presence. The recorded information has been reviewed and is accurate.    Ruth Bibles, PA-C 10/17/14 1542

## 2014-10-17 NOTE — ED Notes (Signed)
Pt. Sstated, I was here Friday night and was given a lot of medication for a stomach thing, and I started having swelling in my face.

## 2014-10-18 ENCOUNTER — Ambulatory Visit (INDEPENDENT_AMBULATORY_CARE_PROVIDER_SITE_OTHER): Payer: Medicaid Other | Admitting: Obstetrics & Gynecology

## 2014-10-18 ENCOUNTER — Telehealth: Payer: Self-pay | Admitting: *Deleted

## 2014-10-18 VITALS — BP 123/69 | HR 83 | Ht 60.0 in | Wt 128.4 lb

## 2014-10-18 DIAGNOSIS — Z30432 Encounter for removal of intrauterine contraceptive device: Secondary | ICD-10-CM

## 2014-10-18 DIAGNOSIS — Z30431 Encounter for routine checking of intrauterine contraceptive device: Secondary | ICD-10-CM

## 2014-10-18 NOTE — Patient Instructions (Signed)
Contraceptive Barrier Methods A barrier method is a type of birth control (contraception) that is used to prevent pregnancy. These methods include:   Female condom.   Female condom.   Diaphragm.   Cervical cap.   Sponge.   Spermicide.  Your health care provider can help you decide what form of contraception is best for you. Always keep in mind the risks of sexually transmitted infections (STIs).  FEMALE CONDOM A female condom is a thin sheath (latex or rubber) that is worn over the penis during sexual intercourse. The condom prevents pregnancy by catching and stopping the sperm from reaching the uterus. Condoms may come with a spermicide on them, and they can only be worn once. Condoms should not be used with petroleum jelly, lotions, or oils. These things decrease their effectiveness. Condoms can be used with water-based lubricants. Condoms help protect against STIs. Latex and polyurethane condoms provide the best available protection against many STIs, including HIV.  FEMALE CONDOM The female condom is a soft, loose-fitting sheath that is put into the vagina before sexual intercourse. It prevents pregnancy by catching the sperm in the condom and blocking the passage of sperm to the uterus. It is intended for one-time use only. A female partner should not use a condom at the same time. The female and female condoms may stick together and break. A female condom can be inserted as long as 8 hours before intercourse. Condoms help protect against STIs.  DIAPHRAGM A diaphragm is a soft, latex, dome-shaped barrier that is placed in the vagina with spermicidal jelly before sexual intercourse. It covers the cervix, kills sperm, and blocks the passage of semen into the cervix. The diaphragm can be inserted up to 2 hours before sex. If it is inserted more than 2 hours before intercourse, then spermicide must be applied again. The diaphragm should be left in the vagina for 6-8 hours after intercourse. It must  be fitted by a health care provider. This method does not protect against STIs.  CERVICAL CAP A cervical cap is a round, soft, latex or plastic cup that is put in the vagina and fits over the cervix. It may be inserted as long as 6 hours before sexual activity. It must be left in place for at least 6 hours after intercourse and can be left in place for as long as 48 hours. It provides continuous protection as long as it is in place, regardless of the number of intercourse acts. The cervical cap cannot be used during your period. It must be fitted by a health care provider. Cervical caps do not protect against STIs. SPONGE A sponge is a soft, circular piece of polyurethane foam that has spermicide in it. The sponge has a loop for removal. It is inserted into the vagina after wetting it and is placed over the cervix before sexual intercourse. The foam is designed to trap and absorb sperm before it enters the cervix. The spermicide kills or immobilizes sperm. The sponge offers an immediate and continuous presence of spermicide throughout a 24-hour period regardless of the number of intercourse acts. The sponge should be left in place for at least 6 hours after sex. It should not be left in for more than 24 hours, and it cannot be reused. The sponge does not protect against STIs. SPERMICIDES Spermicides are chemicals that kill or block sperm from entering the cervix and uterus. They come in the form of creams, jellies, suppositories, foam, film, or tablets. The film, tablets, and  suppositories should be inserted 10 to 30 minutes before sexual intercourse so they can dissolve. They are inserted into the vagina with an applicator before having sexual intercourse. This must be repeated every time you have sexual intercourse. The use of spermicides does not protect against STIs. Document Released: 10/14/2007 Document Revised: 08/19/2013 Document Reviewed: 05/31/2013 [facility] Patient Information 2015 [place], [place].  This information is not intended to replace advice given to you by your health care provider. Make sure you discuss any questions you have with your health care provider.

## 2014-10-18 NOTE — ED Provider Notes (Signed)
Medical screening examination/treatment/procedure(s) were performed by non-physician practitioner and as supervising physician I was immediately available for consultation/collaboration.   EKG Interpretation None        Malvin Johns, MD 10/18/14 1612

## 2014-10-18 NOTE — Progress Notes (Signed)
Subjective:     Patient ID: Ruth Daniels, female   DOB: January 31, 1996, 18 y.o.   MRN: [id number]  HPI [id number] No LMP recorded. Patient is not currently having periods (Reason: IUD). Has cramps and discomfort with intercourse and wants IUD removed today. She will use condoms instead. Advised this is a less effective BCM and she should use spermacide as well.   Review of Systems  Constitutional: Negative.   Genitourinary: Positive for pelvic pain and dyspareunia. Negative for vaginal bleeding and vaginal discharge.       Objective:   Physical Exam  Nursing note and vitals reviewed. Constitutional: She is oriented to person, place, and time. She appears well-developed. No distress.  Pulmonary/Chest: Effort normal. No respiratory distress.  Genitourinary: Vagina normal.  Mucus discharge, string not visible. IUD hook used to remove Mirena intact no problems.  Neurological: She is alert and oriented to person, place, and time.  Skin: Skin is dry.  Psychiatric: She has a normal mood and affect. Her behavior is normal.       Assessment:     IUD removal patient request     Plan:     RTC PRN  Woodroe Mode, MD 10/18/2014

## 2014-10-18 NOTE — Telephone Encounter (Signed)
Patient called and stated that she needs an appointment to get her IUD taken out. She said that [facility] told her it was infected and needed to come out asap. There was an opening on schedule this afternoon so patient scheduled for 315 with Monna Fam.

## 2014-10-22 ENCOUNTER — Encounter (HOSPITAL_COMMUNITY): Payer: Self-pay | Admitting: *Deleted

## 2014-10-22 ENCOUNTER — Inpatient Hospital Stay (HOSPITAL_COMMUNITY)
Admission: AD | Admit: 2014-10-22 | Discharge: 2014-10-22 | Disposition: A | Discharge status: Home / Self Care | Payer: Medicaid Other | Source: Ambulatory Visit | Attending: Obstetrics & Gynecology | Admitting: Obstetrics & Gynecology

## 2014-10-22 ENCOUNTER — Encounter: Payer: Self-pay | Admitting: Obstetrics & Gynecology

## 2014-10-22 DIAGNOSIS — N938 Other specified abnormal uterine and vaginal bleeding: Secondary | ICD-10-CM | POA: Diagnosis present

## 2014-10-22 DIAGNOSIS — Z87891 Personal history of nicotine dependence: Secondary | ICD-10-CM | POA: Diagnosis not present

## 2014-10-22 DIAGNOSIS — R102 Pelvic and perineal pain: Secondary | ICD-10-CM

## 2014-10-22 NOTE — Discharge Instructions (Signed)
Endometriosis Endometriosis is a condition in which the tissue that lines the uterus (endometrium) grows outside of its normal location. The tissue may grow in many locations close to the uterus, but it commonly grows on the ovaries, fallopian tubes, vagina, or bowel. Because the uterus expels, or sheds, its lining every menstrual cycle, there is bleeding wherever the endometrial tissue is located. This can cause pain because blood is irritating to tissues not normally exposed to it.  CAUSES  The cause of endometriosis is not known.  SIGNS AND SYMPTOMS  Often, there are no symptoms. When symptoms are present, they can vary with the location of the displaced tissue. Various symptoms can occur at different times. Although symptoms occur mainly during a woman's menstrual period, they can also occur midcycle and usually stop with menopause. Some people may go months with no symptoms at all. Symptoms may include:   Back or abdominal pain.   Heavier bleeding during periods.   Pain during intercourse.   Painful bowel movements.   Infertility. DIAGNOSIS  Your health care provider will do a physical exam and ask about your symptoms. Various tests may be done, such as:   Blood tests and urine tests. These are done to help rule out other problems.   Ultrasound. This test is done to look for abnormal tissue.   An X-ray of the lower bowel (barium enema).  Laparoscopy. In this procedure, a thin, lighted tube with a tiny camera on the end (laparoscope) is inserted into your abdomen. This helps your health care provider look for abnormal tissue to confirm the diagnosis. The health care provider may also remove a small piece of tissue (biopsy) from any abnormal tissue found. This tissue sample can then be sent to a lab so it can be looked at under a microscope. TREATMENT  Treatment will vary and may include:   Medicines to relieve pain. Nonsteroidal anti-inflammatory drugs (NSAIDs) are a type of  pain medicine that can help to relieve the pain caused by endometriosis.  Hormonal therapy. When using hormonal therapy, periods are eliminated. This eliminates the monthly exposure to blood by the displaced endometrial tissue.   Surgery. Surgery may sometimes be done to remove the abnormal endometrial tissue. In severe cases, surgery may be done to remove the fallopian tubes, uterus, and ovaries (hysterectomy). HOME CARE INSTRUCTIONS   Take all medicines as directed by your health care provider. Do not take aspirin because it may increase bleeding when you are not on hormonal therapy.   Avoid activities that produce pain, including sexual activity. SEEK MEDICAL CARE IF:  You have pelvic pain before, after, or during your periods.  You have pelvic pain between periods that gets worse during your period.  You have pelvic pain during or after sex.  You have pelvic pain with bowel movements or urination, especially during your period.  You have problems getting pregnant.  You have a fever. SEEK IMMEDIATE MEDICAL CARE IF:   Your pain is severe and is not responding to pain medicine.   You have severe nausea and vomiting, or you cannot keep foods down.   You have pain that is limited to the right lower part of your abdomen.   You have swelling or increasing pain in your abdomen.   You see blood in your stool.  MAKE SURE YOU:   Understand these instructions.  Will watch your condition.  Will get help right away if you are not doing well or get worse. Document Released: 12/14/2000 Document   Revised: 05/03/2014 Document Reviewed: 08/14/2013 [facility] Patient Information 2015 [place], [place]. This information is not intended to replace advice given to you by your health care provider. Make sure you discuss any questions you have with your health care provider. Pelvic Pain Female pelvic pain can be caused by many different things and start from a variety of places. Pelvic pain  refers to pain that is located in the lower half of the abdomen and between your hips. The pain may occur over a short period of time (acute) or may be reoccurring (chronic). The cause of pelvic pain may be related to disorders affecting the female reproductive organs (gynecologic), but it may also be related to the bladder, kidney stones, an intestinal complication, or muscle or skeletal problems. Getting help right away for pelvic pain is important, especially if there has been severe, sharp, or a sudden onset of unusual pain. It is also important to get help right away because some types of pelvic pain can be life threatening.  CAUSES  Below are only some of the causes of pelvic pain. The causes of pelvic pain can be in one of several categories.   Gynecologic.  Pelvic inflammatory disease.  Sexually transmitted infection.  Ovarian cyst or a twisted ovarian ligament (ovarian torsion).  Uterine lining that grows outside the uterus (endometriosis).  Fibroids, cysts, or tumors.  Ovulation.  Pregnancy.  Pregnancy that occurs outside the uterus (ectopic pregnancy).  Miscarriage.  Labor.  Abruption of the placenta or ruptured uterus.  Infection.  Uterine infection (endometritis).  Bladder infection.  Diverticulitis.  Miscarriage related to a uterine infection (septic abortion).  Bladder.  Inflammation of the bladder (cystitis).  Kidney stone(s).  Gastrointestinal.  Constipation.  Diverticulitis.  Neurologic.  Trauma.  Feeling pelvic pain because of mental or emotional causes (psychosomatic).  Cancers of the bowel or pelvis. EVALUATION  Your caregiver will want to take a careful history of your concerns. This includes recent changes in your health, a careful gynecologic history of your periods (menses), and a sexual history. Obtaining your family history and medical history is also important. Your caregiver may suggest a pelvic exam. A pelvic exam will help  identify the location and severity of the pain. It also helps in the evaluation of which organ system may be involved. In order to identify the cause of the pelvic pain and be properly treated, your caregiver may order tests. These tests may include:   A pregnancy test.  Pelvic ultrasonography.  An X-ray exam of the abdomen.  A urinalysis or evaluation of vaginal discharge.  Blood tests. HOME CARE INSTRUCTIONS   Only take over-the-counter or prescription medicines for pain, discomfort, or fever as directed by your caregiver.   Rest as directed by your caregiver.   Eat a balanced diet.   Drink enough fluids to make your urine clear or pale yellow, or as directed.   Avoid sexual intercourse if it causes pain.   Apply warm or cold compresses to the lower abdomen depending on which one helps the pain.   Avoid stressful situations.   Keep a journal of your pelvic pain. Write down when it started, where the pain is located, and if there are things that seem to be associated with the pain, such as food or your menstrual cycle.  Follow up with your caregiver as directed.  SEEK MEDICAL CARE IF:  Your medicine does not help your pain.  You have abnormal vaginal discharge. SEEK IMMEDIATE MEDICAL CARE IF:   You have  heavy bleeding from the vagina.   Your pelvic pain increases.   You feel light-headed or faint.   You have chills.   You have pain with urination or blood in your urine.   You have uncontrolled diarrhea or vomiting.   You have a fever or persistent symptoms for more than 3 days.  You have a fever and your symptoms suddenly get worse.   You are being physically or sexually abused.  MAKE SURE YOU:  Understand these instructions.  Will watch your condition.  Will get help if you are not doing well or get worse. Document Released: 11/13/2004 Document Revised: 05/03/2014 Document Reviewed: 04/07/2012 [facility] Patient Information 2015  [place], [place]. This information is not intended to replace advice given to you by your health care provider. Make sure you discuss any questions you have with your health care provider.

## 2014-10-22 NOTE — MAU Note (Signed)
IUD removed on Monday at clinic, "was infected".  Was bleeding, has gotten heavier since removed.  Changing about every 24min , passing clots.pain and cramping in lower abd- radiates to side.

## 2014-10-22 NOTE — MAU Provider Note (Signed)
History     CSN: [id number]  Arrival date and time: 10/22/14 0900   First Provider Initiated Contact with Patient 10/22/14 [phone]      Chief Complaint  Patient presents with  . Vaginal Bleeding  . Abdominal Pain   Vaginal Bleeding The patient's pertinent negatives include no genital itching, genital lesions, genital rash or vaginal discharge. This is a new problem. The current episode started in the past 7 days. The problem occurs constantly. The problem has been gradually worsening. She is not pregnant. Associated symptoms include abdominal pain, back pain, nausea and painful intercourse. Pertinent negatives include no chills, diarrhea, dysuria, fever or vomiting. She uses condoms for contraception.  Abdominal Pain The current episode started more than 1 month ago. The problem has been unchanged. The pain is located in the suprapubic region. The quality of the pain is cramping. The abdominal pain radiates to the back. Associated symptoms include nausea. Pertinent negatives include no diarrhea, dysuria, fever or vomiting. The pain is aggravated by bowel movement.    Pt is a 18 y/o G1P1 female presenting today with abdominal pain and bleeding. She says the pain has been ongoing for several months. She describes it as squeezing and says it sometimes radiates to her back. She has been given pain medicine in the past but says it is not helpful. She endorses some nausea but denies any fever, chills, vomiting, or diarrhea. She endorses constipation and says it is sometimes painful to have a BM. She also endorses increased vaginal bleeding for the past 2 days. She reports changing her pads every 45 minutes. She says it is painful to insert tampons. She denies any abnormal vaginal discharge, itching, or lesions. Cramps a lot with menstruation.   She was seen last Friday in the ER for evaluation of abdominal pain and vaginal bleeding. She was screened for GC/C which came back negative. She also had a  pelvic and abdominal U/S done which showed no abnormalities. She was treated for presumed PID with doxycycline based on clinical exam findings. Patient was then seen in clinic on Monday and had her IUD removed. She reports having problems with her IUD including cramping and bleeding and says she had it for about a year before being removed.   She denies any sexual activity in the past week but claims to only have one partner. She reports she plans to use condoms for contraception. She endorses a history of Chlamydia during her pregnancy for which she was treated. She also endorses increased bleeding during her pregnancy but denies other complications.   OB History   Grav Para Term Preterm Abortions TAB SAB Ect Mult Living   1 1 1       1       Past Medical History  Diagnosis Date  . Allergy     Penicillin  . Eczema   . Chlamydia trachomatis infection in pregnancy   . GBS (group B streptococcus) UTI complicating pregnancy 0/24/0973    Past Surgical History  Procedure Laterality Date  . Foot surgery      Family History  Problem Relation Age of Onset  . Depression Mother     History  Substance Use Topics  . Smoking status: Former Smoker -- 0.25 packs/day    Types: Cigarettes  . Smokeless tobacco: Never Used  . Alcohol Use: No    Allergies:  Allergies  Allergen Reactions  . Latex Rash  . Penicillins Rash and Other (See Comments)    Fever  Prescriptions prior to admission  Medication Sig Dispense Refill  . diphenhydrAMINE (BENADRYL) 25 MG tablet Take 1 tablet (25 mg total) by mouth every 6 (six) hours. X 3 days then PRN itching, swelling, allergic reaction  20 tablet  0  . doxycycline (VIBRAMYCIN) 100 MG capsule Take 1 capsule (100 mg total) by mouth 2 (two) times daily. One po bid x 10 days  20 capsule  0  . famotidine (PEPCID) 20 MG tablet Take 1 tablet (20 mg total) by mouth 2 (two) times daily. X 3 days then as needed  20 tablet  0  . naproxen (NAPROSYN) 500 MG  tablet Take 1 tablet (500 mg total) by mouth 2 (two) times daily.  30 tablet  0  . predniSONE (STERAPRED UNI-PAK) 10 MG tablet Take by mouth daily. Day 1: take 6 tabs.  Day 2: 5 tabs  Day 3: 4 tabs  Day 4: 3 tabs  Day 5: 2 tabs  Day 6: 1 tab  21 tablet  0    Review of Systems  Constitutional: Negative for fever and chills.  Gastrointestinal: Positive for nausea and abdominal pain. Negative for vomiting and diarrhea.  Genitourinary: Positive for vaginal bleeding. Negative for dysuria and vaginal discharge.  Musculoskeletal: Positive for back pain.   Physical Exam   Blood pressure 119/66, pulse 76, temperature 99.2 F (37.3 C), temperature source Oral, resp. rate 18, height 5\' 1"  (1.549 m), weight 58.06 kg (128 lb), not currently breastfeeding.  Physical Exam  Constitutional: She is oriented to person, place, and time. She appears well-developed and well-nourished.  Cardiovascular: Normal rate, regular rhythm and normal heart sounds.  Exam reveals no gallop and no friction rub.   No murmur heard. Respiratory: Effort normal and breath sounds normal. No respiratory distress. She has no wheezes. She has no rales. She exhibits no tenderness.  GI: Soft. Bowel sounds are normal. She exhibits no distension.  Genitourinary: Uterus is tender. Right adnexum displays tenderness. Left adnexum displays tenderness. There is tenderness around the vagina.  Musculoskeletal: She exhibits no edema and no tenderness.  Neurological: She is alert and oriented to person, place, and time.    MAU Course  Procedures  GC/C cultures and pelvic U/S not performed since patient had a workup with these tests one week ago.   Declined pain meds. Wanted a bus pass and work note.    Assessment and Plan  A: 18 y/o G1P1 female with bleeding and . most likely secondary to IUD removal Longstanding pelvic pain with negative work up so far, possibly endometriosis  P: Follow up with gynecologic surgeon to discuss pelvic  pain workup  - Take NSAIDS for pain  - Encourage fluid intake    Bruce Donath 10/22/2014, 9:54 AM   Evaluation and management procedures were performed by PA-Sunder my supervision/collaboration. Chart reviewed, patient examined by me and I agree with management and plan.

## 2014-11-01 ENCOUNTER — Encounter (HOSPITAL_COMMUNITY): Payer: Self-pay | Admitting: *Deleted

## 2014-11-03 ENCOUNTER — Encounter (HOSPITAL_COMMUNITY): Payer: Self-pay | Admitting: *Deleted

## 2014-11-03 ENCOUNTER — Emergency Department (HOSPITAL_COMMUNITY)
Admission: EM | Admit: 2014-11-03 | Discharge: 2014-11-03 | Disposition: A | Discharge status: Home / Self Care | Payer: Medicaid Other | Attending: Emergency Medicine | Admitting: Emergency Medicine

## 2014-11-03 DIAGNOSIS — Z7952 Long term (current) use of systemic steroids: Secondary | ICD-10-CM | POA: Insufficient documentation

## 2014-11-03 DIAGNOSIS — Z79899 Other long term (current) drug therapy: Secondary | ICD-10-CM | POA: Insufficient documentation

## 2014-11-03 DIAGNOSIS — Z880 Allergy status to penicillin: Secondary | ICD-10-CM | POA: Diagnosis not present

## 2014-11-03 DIAGNOSIS — Z8619 Personal history of other infectious and parasitic diseases: Secondary | ICD-10-CM | POA: Diagnosis not present

## 2014-11-03 DIAGNOSIS — Z791 Long term (current) use of non-steroidal anti-inflammatories (NSAID): Secondary | ICD-10-CM | POA: Diagnosis not present

## 2014-11-03 DIAGNOSIS — Z792 Long term (current) use of antibiotics: Secondary | ICD-10-CM | POA: Diagnosis not present

## 2014-11-03 DIAGNOSIS — R112 Nausea with vomiting, unspecified: Secondary | ICD-10-CM | POA: Diagnosis not present

## 2014-11-03 DIAGNOSIS — Z91040 Latex allergy status: Secondary | ICD-10-CM | POA: Diagnosis not present

## 2014-11-03 DIAGNOSIS — R109 Unspecified abdominal pain: Secondary | ICD-10-CM | POA: Diagnosis present

## 2014-11-03 DIAGNOSIS — Z872 Personal history of diseases of the skin and subcutaneous tissue: Secondary | ICD-10-CM | POA: Diagnosis not present

## 2014-11-03 DIAGNOSIS — Z3202 Encounter for pregnancy test, result negative: Secondary | ICD-10-CM | POA: Diagnosis not present

## 2014-11-03 DIAGNOSIS — R102 Pelvic and perineal pain: Secondary | ICD-10-CM | POA: Diagnosis not present

## 2014-11-03 DIAGNOSIS — Z87891 Personal history of nicotine dependence: Secondary | ICD-10-CM | POA: Insufficient documentation

## 2014-11-03 LAB — COMPREHENSIVE METABOLIC PANEL
ALT: 9 U/L (ref 0–35)
ANION GAP: 10 (ref 5–15)
AST: 17 U/L (ref 0–37)
Albumin: 4 g/dL (ref 3.5–5.2)
Alkaline Phosphatase: 68 U/L (ref 39–117)
BILIRUBIN TOTAL: 0.3 mg/dL (ref 0.3–1.2)
BUN: 11 mg/dL (ref 6–23)
CO2: 28 meq/L (ref 19–32)
CREATININE: 0.7 mg/dL (ref 0.50–1.10)
Calcium: 8.8 mg/dL (ref 8.4–10.5)
Chloride: 107 mEq/L (ref 96–112)
GFR calc Af Amer: >90 mL/min (ref >90)
Glucose, Bld: 85 mg/dL (ref 70–99)
Potassium: 3.8 mEq/L (ref 3.7–5.3)
Sodium: 145 mEq/L (ref 137–147)
Total Protein: 7.2 g/dL (ref 6.0–8.3)

## 2014-11-03 LAB — URINALYSIS, ROUTINE W REFLEX MICROSCOPIC
BILIRUBIN URINE: NEGATIVE
Glucose, UA: NEGATIVE mg/dL
Hgb urine dipstick: NEGATIVE
Ketones, ur: 15 mg/dL — AB
Leukocytes, UA: NEGATIVE
Nitrite: NEGATIVE
PROTEIN: NEGATIVE mg/dL
Specific Gravity, Urine: 1.031 — ABNORMAL HIGH (ref 1.005–1.030)
UROBILINOGEN UA: 1 mg/dL (ref 0.0–1.0)
pH: 6 (ref 5.0–8.0)

## 2014-11-03 LAB — CBC WITH DIFFERENTIAL/PLATELET
BASOS PCT: 1 % (ref 0–1)
Basophils Absolute: 0.1 10*3/uL (ref 0.0–0.1)
Eosinophils Absolute: 0.1 10*3/uL (ref 0.0–0.7)
Eosinophils Relative: 2 % (ref 0–5)
HEMATOCRIT: 36.6 % (ref 36.0–46.0)
Hemoglobin: 12.5 g/dL (ref 12.0–15.0)
Lymphocytes Relative: 43 % (ref 12–46)
Lymphs Abs: 2.9 10*3/uL (ref 0.7–4.0)
MCH: 30.7 pg (ref 26.0–34.0)
MCHC: 34.2 g/dL (ref 30.0–36.0)
MCV: 89.9 fL (ref 78.0–100.0)
MONO ABS: 0.4 10*3/uL (ref 0.1–1.0)
MONOS PCT: 6 % (ref 3–12)
Neutro Abs: 3.2 10*3/uL (ref 1.7–7.7)
Neutrophils Relative %: 48 % (ref 43–77)
Platelets: 257 10*3/uL (ref 150–400)
RBC: 4.07 MIL/uL (ref 3.87–5.11)
RDW: 12.3 % (ref 11.5–15.5)
WBC: 6.7 10*3/uL (ref 4.0–10.5)

## 2014-11-03 LAB — PREGNANCY, URINE: Preg Test, Ur: NEGATIVE

## 2014-11-03 LAB — LIPASE, BLOOD: LIPASE: 38 U/L (ref 11–59)

## 2014-11-03 MED ORDER — TRAMADOL HCL 50 MG PO TABS: 50.0000 mg | ORAL_TABLET | Freq: Four times a day (QID) | ORAL | Status: DC | PRN

## 2014-11-03 MED ORDER — KETOROLAC TROMETHAMINE 10 MG PO TABS
10.0000 mg | ORAL_TABLET | Freq: Four times a day (QID) | ORAL | Status: DC | PRN
Start: 2014-11-03 — End: 2015-05-23

## 2014-11-03 NOTE — ED Notes (Signed)
The pt is c/i abd pain nausea dizziness no appetite for 2 weeks. [id number]  She has irregular periods

## 2014-11-03 NOTE — ED Provider Notes (Signed)
CSN: [id number]     Arrival date & time 11/03/14  1648 History   First MD Initiated Contact with Patient 11/03/14 1916     Chief Complaint  Patient presents with  . Abdominal Pain     (Consider location/radiation/quality/duration/timing/severity/associated sxs/prior Treatment) HPI Comments: The patient is a 18 year old female presenting to the emergency department with chief complaint of lower abdominal discomfort for 2 months. Patient reports constant, sharp pain. Denies aggravating or relieving factors. She reports associated nausea with 3 episodes of vomiting, last emesis today. She denies fever, chills, dysuria, abnormal vaginal discharge, back pain. States she has been evaluated in the ED for similar symptoms as well as MAU, unknown what working diagnoses is. Patient reports persistent symptoms after IUD removal. Reports spotting 3 weeks ago. No abnormal vaginal bleeding or regular period since IUD removal.  Patient is a 18 y.o. female presenting with abdominal pain. The history is provided by the patient. No language interpreter was used.  Abdominal Pain Associated symptoms: nausea and vomiting   Associated symptoms: no chills, no constipation, no diarrhea, no dysuria, no fever and no vaginal discharge     Past Medical History  Diagnosis Date  . Allergy     Penicillin  . Eczema   . Chlamydia trachomatis infection in pregnancy   . GBS (group B streptococcus) UTI complicating pregnancy 4/65/0354   Past Surgical History  Procedure Laterality Date  . Foot surgery     Family History  Problem Relation Age of Onset  . Depression Mother    History  Substance Use Topics  . Smoking status: Former Smoker -- 0.25 packs/day    Types: Cigarettes  . Smokeless tobacco: Never Used  . Alcohol Use: No   OB History    Gravida Para Term Preterm AB TAB SAB Ectopic Multiple Living   1 1 1       1      Review of Systems  Constitutional: Negative for fever and chills.  Gastrointestinal:  Positive for nausea, vomiting and abdominal pain. Negative for diarrhea, constipation and blood in stool.  Genitourinary: Positive for pelvic pain. Negative for dysuria, urgency, frequency, vaginal discharge and genital sores.  Musculoskeletal: Negative for back pain.      Allergies  Latex and Penicillins  Home Medications   Prior to Admission medications   Medication Sig Start Date End Date Taking? Authorizing Provider  diphenhydrAMINE (BENADRYL) 25 MG tablet Take 1 tablet (25 mg total) by mouth every 6 (six) hours. X 3 days then PRN itching, swelling, allergic reaction 10/17/14   [name], PA-C  doxycycline (VIBRAMYCIN) 100 MG capsule Take 1 capsule (100 mg total) by mouth 2 (two) times daily. One po bid x 10 days 10/15/14   Domenic Moras, PA-C  famotidine (PEPCID) 20 MG tablet Take 1 tablet (20 mg total) by mouth 2 (two) times daily. X 3 days then as needed 10/17/14   [name], PA-C  naproxen (NAPROSYN) 500 MG tablet Take 1 tablet (500 mg total) by mouth 2 (two) times daily. 10/15/14   Domenic Moras, PA-C  predniSONE (STERAPRED UNI-PAK) 10 MG tablet Take by mouth daily. Day 1: take 6 tabs.  Day 2: 5 tabs  Day 3: 4 tabs  Day 4: 3 tabs  Day 5: 2 tabs  Day 6: 1 tab 10/17/14   [name], PA-C   BP 117/80 mmHg  Pulse 70  Temp(Src) 98.4 F (36.9 C)  Resp 16  Ht 5' (1.524 m)  Wt 128 lb (58.06 kg)  BMI 25.00 kg/m2  SpO2 100% Physical Exam  Constitutional: She is oriented to person, place, and time. She appears well-developed and well-nourished. No distress.  Patient texting throughout the encounter.  HENT:  Head: Normocephalic and atraumatic.  Neck: Neck supple.  Cardiovascular: Normal rate and regular rhythm.   Pulmonary/Chest: Effort normal and breath sounds normal. No respiratory distress. She has no wheezes. She has no rales.  Abdominal: Soft. Bowel sounds are normal. There is tenderness in the right lower quadrant, suprapubic area and left lower quadrant. There is no rebound, no  guarding, no tenderness at McBurney's point and negative Murphy's sign.  Musculoskeletal: Normal range of motion.  Neurological: She is alert and oriented to person, place, and time.  Skin: Skin is warm and dry.  Psychiatric: She has a normal mood and affect. Her behavior is normal.  Nursing note and vitals reviewed.   ED Course  Procedures (including critical care time) Labs Review Labs Reviewed  URINALYSIS, ROUTINE W REFLEX MICROSCOPIC - Abnormal; Notable for the following:    Specific Gravity, Urine 1.031 (*)    Ketones, ur 15 (*)    All other components within normal limits  CBC WITH DIFFERENTIAL  COMPREHENSIVE METABOLIC PANEL  LIPASE, BLOOD  PREGNANCY, URINE    Imaging Review No results found.   EKG Interpretation None      MDM   Final diagnoses:  Pelvic pain in female   Patient presents with lower abdominal discomfort similar to previous encounters. 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  Pelvic ultrasound negative. Plan to repeat pelvic. Labs without concerning abnormalities. Re-eval pt talking on phone in room.  Discussed lab work, previous CT and ultrasound results with the patient. Patient declined pelvic exam, GC chlamydia test, wet prep were treatment for possible STD. Discussed with Dr. Glo Herring, OB/GYN who advises Toradol and tramadol follow-up as an outpatient. Discussed lab results, imaging results, and treatment plan with the patient. Return precautions given. Reports understanding and no other concerns at this time.  Patient is stable for discharge at this time.  Meds given in ED:  Medications - No data to display  New Prescriptions   KETOROLAC (TORADOL) 10 MG TABLET    Take 1 tablet (10 mg total) by mouth every 6 (six) hours as needed.   TRAMADOL (ULTRAM) 50 MG TABLET    Take 1 tablet (50 mg total) by mouth every 6 (six)  hours as needed.     Harvie Heck, PA-C 11/03/14 2228  Charlesetta Shanks, MD 11/08/14 [phone]

## 2014-11-03 NOTE — Discharge Instructions (Signed)
Call an OB/GYN for further evaluation of your pelvic pain and pelvic venous congestion syndrome. Call for a follow up appointment with a Family or Primary Care Provider.  Return if Symptoms worsen.   Take medication as prescribed.  Do not take any other antiinflammatory medications while taking Toradol.

## 2014-11-17 ENCOUNTER — Encounter: Payer: Self-pay | Admitting: General Practice

## 2014-12-15 ENCOUNTER — Encounter: Payer: Medicaid Other | Admitting: Obstetrics & Gynecology

## 2014-12-15 ENCOUNTER — Encounter: Payer: Self-pay | Admitting: *Deleted

## 2014-12-16 ENCOUNTER — Encounter: Payer: Self-pay | Admitting: *Deleted

## 2015-01-07 ENCOUNTER — Emergency Department (HOSPITAL_COMMUNITY)
Admission: EM | Admit: 2015-01-07 | Discharge: 2015-01-07 | Disposition: A | Discharge status: Home / Self Care | Payer: Medicaid Other | Attending: Emergency Medicine | Admitting: Emergency Medicine

## 2015-01-07 ENCOUNTER — Encounter (HOSPITAL_COMMUNITY): Payer: Self-pay | Admitting: Emergency Medicine

## 2015-01-07 DIAGNOSIS — Z3202 Encounter for pregnancy test, result negative: Secondary | ICD-10-CM | POA: Insufficient documentation

## 2015-01-07 DIAGNOSIS — Z79899 Other long term (current) drug therapy: Secondary | ICD-10-CM | POA: Diagnosis not present

## 2015-01-07 DIAGNOSIS — Z872 Personal history of diseases of the skin and subcutaneous tissue: Secondary | ICD-10-CM | POA: Diagnosis not present

## 2015-01-07 DIAGNOSIS — Z87891 Personal history of nicotine dependence: Secondary | ICD-10-CM | POA: Diagnosis not present

## 2015-01-07 DIAGNOSIS — Z880 Allergy status to penicillin: Secondary | ICD-10-CM | POA: Insufficient documentation

## 2015-01-07 DIAGNOSIS — Z8742 Personal history of other diseases of the female genital tract: Secondary | ICD-10-CM | POA: Diagnosis not present

## 2015-01-07 DIAGNOSIS — R109 Unspecified abdominal pain: Secondary | ICD-10-CM | POA: Insufficient documentation

## 2015-01-07 DIAGNOSIS — Z91040 Latex allergy status: Secondary | ICD-10-CM | POA: Diagnosis not present

## 2015-01-07 DIAGNOSIS — Z8619 Personal history of other infectious and parasitic diseases: Secondary | ICD-10-CM | POA: Insufficient documentation

## 2015-01-07 DIAGNOSIS — Z792 Long term (current) use of antibiotics: Secondary | ICD-10-CM | POA: Insufficient documentation

## 2015-01-07 HISTORY — DX: Acute parametritis and pelvic cellulitis: N73.0

## 2015-01-07 HISTORY — DX: Endometriosis, unspecified: N80.9

## 2015-01-07 LAB — COMPREHENSIVE METABOLIC PANEL
ALBUMIN: 4.3 g/dL (ref 3.5–5.2)
ALT: 10 U/L (ref 0–35)
AST: 16 U/L (ref 0–37)
Alkaline Phosphatase: 48 U/L (ref 39–117)
Anion gap: 6 (ref 5–15)
BUN: 11 mg/dL (ref 6–23)
CALCIUM: 8.9 mg/dL (ref 8.4–10.5)
CO2: 27 mmol/L (ref 19–32)
CREATININE: 0.59 mg/dL (ref 0.50–1.10)
Chloride: 105 mEq/L (ref 96–112)
GFR calc non Af Amer: >90 mL/min (ref >90)
Glucose, Bld: 88 mg/dL (ref 70–99)
POTASSIUM: 3.6 mmol/L (ref 3.5–5.1)
Sodium: 138 mmol/L (ref 135–145)
Total Bilirubin: 0.9 mg/dL (ref 0.3–1.2)
Total Protein: 7 g/dL (ref 6.0–8.3)

## 2015-01-07 LAB — CBC WITH DIFFERENTIAL/PLATELET
BASOS ABS: 0 10*3/uL (ref 0.0–0.1)
BASOS PCT: 1 % (ref 0–1)
EOS PCT: 1 % (ref 0–5)
Eosinophils Absolute: 0 10*3/uL (ref 0.0–0.7)
HEMATOCRIT: 37.1 % (ref 36.0–46.0)
HEMOGLOBIN: 12.8 g/dL (ref 12.0–15.0)
Lymphocytes Relative: 40 % (ref 12–46)
Lymphs Abs: 1.8 10*3/uL (ref 0.7–4.0)
MCH: 31.6 pg (ref 26.0–34.0)
MCHC: 34.5 g/dL (ref 30.0–36.0)
MCV: 91.6 fL (ref 78.0–100.0)
Monocytes Absolute: 0.2 10*3/uL (ref 0.1–1.0)
Monocytes Relative: 5 % (ref 3–12)
NEUTROS PCT: 53 % (ref 43–77)
Neutro Abs: 2.4 10*3/uL (ref 1.7–7.7)
Platelets: 266 10*3/uL (ref 150–400)
RBC: 4.05 MIL/uL (ref 3.87–5.11)
RDW: 12.4 % (ref 11.5–15.5)
WBC: 4.5 10*3/uL (ref 4.0–10.5)

## 2015-01-07 LAB — URINE MICROSCOPIC-ADD ON

## 2015-01-07 LAB — URINALYSIS, ROUTINE W REFLEX MICROSCOPIC
Bilirubin Urine: NEGATIVE
Glucose, UA: NEGATIVE mg/dL
Ketones, ur: NEGATIVE mg/dL
Nitrite: NEGATIVE
Protein, ur: NEGATIVE mg/dL
SPECIFIC GRAVITY, URINE: 1.019 (ref 1.005–1.030)
Urobilinogen, UA: 0.2 mg/dL (ref 0.0–1.0)
pH: 6 (ref 5.0–8.0)

## 2015-01-07 LAB — POC URINE PREG, ED: Preg Test, Ur: NEGATIVE

## 2015-01-07 MED ORDER — KETOROLAC TROMETHAMINE 60 MG/2ML IM SOLN
60.0000 mg | Freq: Once | INTRAMUSCULAR | Status: AC
  Administered 2015-01-07: 60 mg via INTRAMUSCULAR
  Filled 2015-01-07: qty 2

## 2015-01-07 NOTE — ED Notes (Signed)
Pt denies fever, odor or discharge at this time.  States she if ovulating and can have normal discomfort but this is worse than normal.

## 2015-01-07 NOTE — ED Provider Notes (Signed)
CSN: [id number]     Arrival date & time 01/07/15  1227 History   First MD Initiated Contact with Patient 01/07/15 1359     Chief Complaint  Patient presents with  . Abdominal Pain     (Consider location/radiation/quality/duration/timing/severity/associated sxs/prior Treatment) HPI  Ruth Daniels is a 19 y.o. female with PMH of endometriosis, PID presenting with persistent abdominal pain. Patient states she has had left lower quadrant and left side pain that began last night and this has been intermittent for over 6 months. Patient stated it was sudden and she felt the pain persistently. She denies any fevers, nausea, vomiting, diarrhea. She denies any pelvic complaints or urinary complaints. No abdominal surgeries. She was evaluated for the exact same pain in October with CT and pelvic ultrasound as well as pelvic exam. She was diagnosed with PID and given antibiotics. She states the pain has persisted and is worse than before. CT with evidence of pelvic venous congestion. Last bowel movement today and normal without blood. Last menstrual. 12/24/2014 and was normal. Patient denies any new sexual partners and she doesn't use any contraception.   Past Medical History  Diagnosis Date  . Allergy     Penicillin  . Eczema   . Chlamydia trachomatis infection in pregnancy   . GBS (group B streptococcus) UTI complicating pregnancy 04/08/8118  . Endometriosis   . PID (acute pelvic inflammatory disease)    Past Surgical History  Procedure Laterality Date  . Foot surgery     Family History  Problem Relation Age of Onset  . Depression Mother    History  Substance Use Topics  . Smoking status: Former Smoker -- 0.25 packs/day    Types: Cigarettes  . Smokeless tobacco: Never Used  . Alcohol Use: No   OB History    Gravida Para Term Preterm AB TAB SAB Ectopic Multiple Living   1 1 1       1      Review of Systems 10 Systems reviewed and are negative for acute change except as noted in  the HPI.    Allergies  Latex and Penicillins  Home Medications   Prior to Admission medications   Medication Sig Start Date End Date Taking? Authorizing Provider  acetaminophen (TYLENOL) 500 MG tablet Take 1,000 mg by mouth every 6 (six) hours as needed for moderate pain.   Yes Historical Provider, MD  diphenhydrAMINE (BENADRYL) 25 MG tablet Take 1 tablet (25 mg total) by mouth every 6 (six) hours. X 3 days then PRN itching, swelling, allergic reaction Patient not taking: Reported on 01/07/2015 10/17/14   [name], PA-C  doxycycline (VIBRAMYCIN) 100 MG capsule Take 1 capsule (100 mg total) by mouth 2 (two) times daily. One po bid x 10 days Patient not taking: Reported on 01/07/2015 10/15/14   Domenic Moras, PA-C  famotidine (PEPCID) 20 MG tablet Take 1 tablet (20 mg total) by mouth 2 (two) times daily. X 3 days then as needed Patient not taking: Reported on 01/07/2015 10/17/14   [name], PA-C  ketorolac (TORADOL) 10 MG tablet Take 1 tablet (10 mg total) by mouth every 6 (six) hours as needed. Patient not taking: Reported on 01/07/2015 11/03/14   Harvie Heck, PA-C  naproxen (NAPROSYN) 500 MG tablet Take 1 tablet (500 mg total) by mouth 2 (two) times daily. Patient not taking: Reported on 01/07/2015 10/15/14   Domenic Moras, PA-C  predniSONE (STERAPRED UNI-PAK) 10 MG tablet Take by mouth daily. Day 1: take 6 tabs.  Day  2: 5 tabs  Day 3: 4 tabs  Day 4: 3 tabs  Day 5: 2 tabs  Day 6: 1 tab Patient not taking: Reported on 01/07/2015 10/17/14   [name], PA-C  traMADol (ULTRAM) 50 MG tablet Take 1 tablet (50 mg total) by mouth every 6 (six) hours as needed. Patient not taking: Reported on 01/07/2015 11/03/14   Harvie Heck, PA-C   BP 114/61 mmHg  Pulse 65  Temp(Src) 98 F (36.7 C) (Oral)  Resp 16  SpO2 100%  LMP 12/24/2014 Physical Exam  Constitutional: She appears well-developed and well-nourished. No distress.  HENT:  Head: Normocephalic and atraumatic.  Mouth/Throat: Oropharynx is clear and  moist.  Eyes: Conjunctivae and EOM are normal. Right eye exhibits no discharge. Left eye exhibits no discharge.  Cardiovascular: Normal rate, regular rhythm and normal heart sounds.   Pulmonary/Chest: Effort normal and breath sounds normal. No respiratory distress. She has no wheezes.  Abdominal: Soft. Bowel sounds are normal. She exhibits no distension.  Diffuse abdominal tenderness worse in left lower quadrant and left upper quadrant without rebound, rigidity, guarding. No CVA tenderness.  Neurological: She is alert. She exhibits normal muscle tone. Coordination normal.  Skin: Skin is warm and dry. She is not diaphoretic.  Nursing note and vitals reviewed.   ED Course  Procedures (including critical care time) Labs Review Labs Reviewed  URINALYSIS, ROUTINE W REFLEX MICROSCOPIC - Abnormal; Notable for the following:    Hgb urine dipstick TRACE (*)    Leukocytes, UA SMALL (*)    All other components within normal limits  CBC WITH DIFFERENTIAL  COMPREHENSIVE METABOLIC PANEL  URINE MICROSCOPIC-ADD ON  POC URINE PREG, ED    Imaging Review No results found.   EKG Interpretation None      MDM   Final diagnoses:  Left sided abdominal pain   Patient presenting with persistent abdominal pain. She has had multiple evaluations in the ED for the same pain. She states it is worse in severity today. She denies any other abdominal or pelvic symptoms. She is afebrile and her vital signs are stable. Patient with left-sided abdominal tenderness without any evidence of peritonitis. Labs today noncontributory. Patient with improvement of her symptoms with Toradol in ED. In October 2015 patient with CT and pelvic ultrasound with evidence of pelvic venous congestion syndrome. Due to the persistent nature of her pain it appears to be chronic and this likely is the cause. Patient has been seen by [facility] prior but not recently. Patient is to follow-up with [facility]. Discussed that the  pathophysiology of pelvic venous congestion is not well understood as well as treatment. I recommended evaluation by a specialist, [facility].   Discussed return precautions with patient. Discussed all results and patient verbalizes understanding and agrees with plan.     Pura Spice, PA-C 01/07/15 [place], MD 01/08/15 [phone]

## 2015-01-07 NOTE — ED Notes (Signed)
Per EMS. Pt began having LLQ and L side pain that began last night. Sudden onset. No n/v. Hx of endometriosis and PID

## 2015-01-07 NOTE — Discharge Instructions (Signed)
Return to the emergency room with worsening of symptoms, new symptoms or with symptoms that are concerning, especially fevers, severe abdominal pain, blood in vomit or stool, feeling faint. 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 Call to make an appointment with [facility] for follow-up as soon as possible.   Abdominal Pain, Women Abdominal (stomach, pelvic, or belly) pain can be caused by many things. It is important to tell your doctor:  The location of the pain.  Does it come and go or is it present all the time?  Are there things that start the pain (eating certain foods, exercise)?  Are there other symptoms associated with the pain (fever, nausea, vomiting, diarrhea)? All of this is helpful to know when trying to find the cause of the pain. CAUSES   Stomach: virus or bacteria infection, or ulcer.  Intestine: appendicitis (inflamed appendix), regional ileitis (Crohn's disease), ulcerative colitis (inflamed colon), irritable bowel syndrome, diverticulitis (inflamed diverticulum of the colon), or cancer of the stomach or intestine.  Gallbladder disease or stones in the gallbladder.  Kidney disease, kidney stones, or infection.  Pancreas infection or cancer.  Fibromyalgia (pain disorder).  Diseases of the female organs:  Uterus: fibroid (non-cancerous) tumors or infection.  Fallopian tubes: infection or tubal pregnancy.  Ovary: cysts or tumors.  Pelvic adhesions (scar tissue).  Endometriosis (uterus lining tissue growing in the pelvis and on the pelvic organs).  Pelvic congestion syndrome (female organs filling up with blood just before the menstrual period).  Pain with the menstrual period.  Pain with ovulation (producing an egg).  Pain with an IUD (intrauterine device, birth control) in the uterus.  Cancer of the female organs.  Functional pain (pain not caused by a disease, may improve without treatment).  Psychological  pain.  Depression. DIAGNOSIS  Your doctor will decide the seriousness of your pain by doing an examination.  Blood tests.  X-rays.  Ultrasound.  CT scan (computed tomography, special type of X-ray).  MRI (magnetic resonance imaging).  Cultures, for infection.  Barium enema (dye inserted in the large intestine, to better view it with X-rays).  Colonoscopy (looking in intestine with a lighted tube).  Laparoscopy (minor surgery, looking in abdomen with a lighted tube).  Major abdominal exploratory surgery (looking in abdomen with a large incision). TREATMENT  The treatment will depend on the cause of the pain.   Many cases can be observed and treated at home.  Over-the-counter medicines recommended by your caregiver.  Prescription medicine.  Antibiotics, for infection.  Birth control pills, for painful periods or for ovulation pain.  Hormone treatment, for endometriosis.  Nerve blocking injections.  Physical therapy.  Antidepressants.  Counseling with a psychologist or psychiatrist.  Minor or major surgery. HOME CARE INSTRUCTIONS   Do not take laxatives, unless directed by your caregiver.  Take over-the-counter pain medicine only if ordered by your caregiver. Do not take aspirin because it can cause an upset stomach or bleeding.  Try a clear liquid diet (broth or water) as ordered by your caregiver. Slowly move to a bland diet, as tolerated, if the pain is related to the stomach or intestine.  Have a thermometer and take your temperature several times a day, and record it.  Bed rest and sleep, if it helps the pain.  Avoid sexual intercourse, if it causes pain.  Avoid stressful situations.  Keep your follow-up appointments and tests, as your caregiver orders.  If the pain does not go away with medicine or surgery, you may  try:  Acupuncture.  Relaxation exercises (yoga, meditation).  Group therapy.  Counseling. SEEK MEDICAL CARE IF:   You  notice certain foods cause stomach pain.  Your home care treatment is not helping your pain.  You need stronger pain medicine.  You want your IUD removed.  You feel faint or lightheaded.  You develop nausea and vomiting.  You develop a rash.  You are having side effects or an allergy to your medicine. SEEK IMMEDIATE MEDICAL CARE IF:   Your pain does not go away or gets worse.  You have a fever.  Your pain is felt only in portions of the abdomen. The right side could possibly be appendicitis. The left lower portion of the abdomen could be colitis or diverticulitis.  You are passing blood in your stools (bright red or black tarry stools, with or without vomiting).  You have blood in your urine.  You develop chills, with or without a fever.  You pass out. MAKE SURE YOU:   Understand these instructions.  Will watch your condition.  Will get help right away if you are not doing well or get worse. Document Released: 10/14/2007 Document Revised: 05/03/2014 Document Reviewed: 11/03/2009 [facility] Patient Information 2015 [place], [place]. This information is not intended to replace advice given to you by your health care provider. Make sure you discuss any questions you have with your health care provider.

## 2015-01-13 ENCOUNTER — Encounter: Payer: Self-pay | Admitting: Obstetrics and Gynecology

## 2015-01-13 ENCOUNTER — Ambulatory Visit (INDEPENDENT_AMBULATORY_CARE_PROVIDER_SITE_OTHER): Payer: Medicaid Other | Admitting: Obstetrics and Gynecology

## 2015-01-13 VITALS — BP 121/54 | HR 73 | Temp 98.4°F | Ht 60.0 in | Wt 116.8 lb

## 2015-01-13 DIAGNOSIS — R102 Pelvic and perineal pain: Principal | ICD-10-CM

## 2015-01-13 DIAGNOSIS — N949 Unspecified condition associated with female genital organs and menstrual cycle: Secondary | ICD-10-CM

## 2015-01-13 DIAGNOSIS — G8929 Other chronic pain: Secondary | ICD-10-CM

## 2015-01-13 DIAGNOSIS — Z7689 Persons encountering health services in other specified circumstances: Secondary | ICD-10-CM

## 2015-01-13 DIAGNOSIS — Z0289 Encounter for other administrative examinations: Secondary | ICD-10-CM

## 2015-01-13 NOTE — Progress Notes (Signed)
Patient ID: Ruth Daniels, female   DOB: 12/29/1996, 19 y.o.   MRN: [id number] 19 yo G1P1 presenting today for the evaluation of lower abdominal pain which has been present for the past 6 months. Patient is unable to describe the pain very well but states that it is Elsie Sakuma and sharp in nature. There are no alleviating or aggravating factor. She states that toradol and tramadol do not help with her pain much. Her pain is minimally relieved with a heating pad. She reports normal cycles, every 28 days lasting 4-5 days. Her abdominal pain does not change in the presence of her menses. She denies worsening pain or relief with bowel movement. Her diet does not seem to have an impact on her pain but she admits to not paying close attention to that. She denies pain with urination. Patient is sexually active with same partner and not using any contraception.  Past Medical History  Diagnosis Date  . Allergy     Penicillin  . Eczema   . Chlamydia trachomatis infection in pregnancy   . GBS (group B streptococcus) UTI complicating pregnancy 1/60/1093  . Endometriosis   . PID (acute pelvic inflammatory disease)    Past Surgical History  Procedure Laterality Date  . Foot surgery     Family History  Problem Relation Age of Onset  . Depression Mother    History  Substance Use Topics  . Smoking status: Former Smoker -- 0.25 packs/day    Types: Cigarettes  . Smokeless tobacco: Never Used  . Alcohol Use: No   GENERAL: Well-developed, well-nourished female in no acute distress.  ABDOMEN: Soft, nontender, nondistended. No organomegaly. PELVIC: Normal external female genitalia. Vagina is pink and rugated.  Normal discharge. Normal appearing cervix. Uterus is normal in size. No adnexal mass or tenderness. EXTREMITIES: No cyanosis, clubbing, or edema, 2+ distal pulses.  10/2015 FINDINGS: Lung bases are normal.  Abdominal images demonstrate a normal spleen, pancreas, gallbladder and adrenal glands.  The appendix is normal. Kidneys are normal. Vascular structures and bowel are within normal. There is subtle patchy decreased liver perfusion with heterogeneous opacification of the IVC which may be due to thrombus versus pseudothrombus.  Pelvic images demonstrate an IUD in adequate position. Uterus appears normal in size as there is diffuse low density of the myometrium. Ovaries are unremarkable. There is free fluid within the pelvis with ill definition of the wall of the lower uterine segment and cervix/vagina. There are prominent periuterine venous structures left greater than right. These findings in the pelvis under nonspecific and may be due to an infectious/inflammatory process as cannot exclude pelvic inflammatory disease. However these findings may be due to pelvic venous congestion syndrome. There is minimal rectal wall thickening which may be secondary to pelvic inflammatory disease or pelvic venous congestion. Remaining pelvic structures are unremarkable.  IMPRESSION: Nonspecific findings within the pelvis as described to include free fluid as well as mild edematous changes of the uterus/cervix and vaginal wall as well as adjacent rectum. There are prominent periuterine venous structures left worse than right as well as heterogeneous opacification of the IVC above the level of the renal veins with somewhat patchy decreased perfusion of the liver. These findings may all be the result of pelvic venous congestion syndrome possibly due to thrombus within the IVC. Pelvic inflammatory disease is less likely. Ultrasound to evaluate the IVC may be helpful.  ADDENDUM: There are prominence of the ovarian veins bilaterally measuring 11 mm on the right and 7 mm on the  left. There is no significant opacification of the right ovarian vein with hyperdense opacification of the left ovarian vein which has a 50 percent narrowing at its junction to the left renal vein. 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  Ultrasound 10/2015 FINDINGS: Uterus  Measurements: 9.0 x 3.7 x 6.4 cm. No fibroids or other mass visualized.  Endometrium  Thickness: 0.3 cm. IUD is in place and unremarkable.  Right ovary  Measurements: 3.3 x 2.5 x 2.4 cm. Normal appearance/no adnexal mass.  Left ovary  Measurements: 2.8 x 1.6 x 1.8 cm. Normal appearance/no adnexal mass.  Other findings  Trace amount of free pelvic fluid is compatible with physiologic change.  IMPRESSION: No acute finding or finding to explain the patient's symptoms. IUD is in place and unremarkable.  Abdominal ultrasound FINDINGS: The inferior vena cava is patent. There is no evidence for thrombus. No evidence for hepatic vein thrombosis.  IMPRESSION: Negative exam. No evidence for IVC thrombosis  A/P 19 yo G1P1 with chronic pelvic pain for the past 6 months - Discussed pelvic pain of unclear etiology. Does not appear to be of GYN origin. No evidence of thrombosis. No pain on examination - Patient declined pain prescription - Discussed contraception options but patient declined. She states that she does not plan to conceive but is not interested in prevention - Will refer to primary care physician to further evaluate possible GI issue or interstitial cystitis - RTC prn for annual exam

## 2015-01-13 NOTE — Progress Notes (Signed)
Faxed referral form to [facility].

## 2015-01-13 NOTE — Addendum Note (Signed)
Addended by: Michel Harrow on: 01/13/2015 01:41 PM   Modules accepted: Orders

## 2015-01-14 ENCOUNTER — Encounter: Payer: Self-pay | Admitting: *Deleted

## 2015-02-14 ENCOUNTER — Emergency Department (HOSPITAL_COMMUNITY)
Admission: EM | Admit: 2015-02-14 | Discharge: 2015-02-14 | Disposition: A | Discharge status: Home / Self Care | Payer: Medicaid Other | Attending: Emergency Medicine | Admitting: Emergency Medicine

## 2015-02-14 ENCOUNTER — Emergency Department (HOSPITAL_COMMUNITY): Payer: Medicaid Other

## 2015-02-14 DIAGNOSIS — R059 Cough, unspecified: Secondary | ICD-10-CM

## 2015-02-14 DIAGNOSIS — Z87448 Personal history of other diseases of urinary system: Secondary | ICD-10-CM | POA: Insufficient documentation

## 2015-02-14 DIAGNOSIS — Z8742 Personal history of other diseases of the female genital tract: Secondary | ICD-10-CM | POA: Insufficient documentation

## 2015-02-14 DIAGNOSIS — Z87891 Personal history of nicotine dependence: Secondary | ICD-10-CM | POA: Insufficient documentation

## 2015-02-14 DIAGNOSIS — R05 Cough: Secondary | ICD-10-CM

## 2015-02-14 DIAGNOSIS — Z792 Long term (current) use of antibiotics: Secondary | ICD-10-CM | POA: Diagnosis not present

## 2015-02-14 DIAGNOSIS — Z872 Personal history of diseases of the skin and subcutaneous tissue: Secondary | ICD-10-CM | POA: Insufficient documentation

## 2015-02-14 DIAGNOSIS — J029 Acute pharyngitis, unspecified: Secondary | ICD-10-CM | POA: Diagnosis not present

## 2015-02-14 DIAGNOSIS — Z880 Allergy status to penicillin: Secondary | ICD-10-CM | POA: Insufficient documentation

## 2015-02-14 DIAGNOSIS — Z91040 Latex allergy status: Secondary | ICD-10-CM | POA: Insufficient documentation

## 2015-02-14 DIAGNOSIS — Z791 Long term (current) use of non-steroidal anti-inflammatories (NSAID): Secondary | ICD-10-CM | POA: Insufficient documentation

## 2015-02-14 DIAGNOSIS — Z79899 Other long term (current) drug therapy: Secondary | ICD-10-CM | POA: Insufficient documentation

## 2015-02-14 MED ORDER — GUAIFENESIN 100 MG/5ML PO LIQD: 100.0000 mg | ORAL | Status: DC | PRN

## 2015-02-14 NOTE — ED Notes (Signed)
Pt c/o flu like symptoms x1 week.  

## 2015-02-14 NOTE — ED Notes (Signed)
Bed: WTR6 Expected date:  Expected time:  Means of arrival:  Comments: 19 y/o F cough

## 2015-02-14 NOTE — ED Notes (Signed)
Pt c/o of cough and sore throat x 1 week. Pt then states she has had a productive cough with streaks of blood x 1 year. Pt states she was seen at [facility] for the same.

## 2015-02-14 NOTE — ED Provider Notes (Signed)
CSN: [id number]     Arrival date & time 02/14/15  1006 History   First MD Initiated Contact with Patient 02/14/15 1009     Chief Complaint  Patient presents with  . Cough  . Sore Throat     (Consider location/radiation/quality/duration/timing/severity/associated sxs/prior Treatment) HPI Comments: Patient with no pertinent past medical history presents emergency department with chief complaint of cough. She states that she has had the cough for the past week. She reports associated sore throat and runny nose. She denies any fevers, or chills. She states that she has had some productive sputum, and thinks that she has seen some streaky blood.  She has not taken anything to alleviate her symptoms. There are no aggravating factors. Patient also complains of pain on the right side of her back.  The history is provided by the patient. No language interpreter was used.    Past Medical History  Diagnosis Date  . Allergy     Penicillin  . Eczema   . Chlamydia trachomatis infection in pregnancy   . GBS (group B streptococcus) UTI complicating pregnancy 2/40/9735  . Endometriosis   . PID (acute pelvic inflammatory disease)    Past Surgical History  Procedure Laterality Date  . Foot surgery     Family History  Problem Relation Age of Onset  . Depression Mother    History  Substance Use Topics  . Smoking status: Former Smoker -- 0.25 packs/day    Types: Cigarettes  . Smokeless tobacco: Never Used  . Alcohol Use: No   OB History    Gravida Para Term Preterm AB TAB SAB Ectopic Multiple Living   1 1 1       1      Review of Systems  Constitutional: Negative for fever and chills.  HENT: Positive for postnasal drip, rhinorrhea, sinus pressure, sneezing and sore throat.   Respiratory: Positive for cough. Negative for shortness of breath.   Cardiovascular: Negative for chest pain.  Gastrointestinal: Negative for nausea, vomiting, abdominal pain, diarrhea and constipation.   Genitourinary: Negative for dysuria.  All other systems reviewed and are negative.     Allergies  Latex and Penicillins  Home Medications   Prior to Admission medications   Medication Sig Start Date End Date Taking? Authorizing Provider  acetaminophen (TYLENOL) 500 MG tablet Take 1,000 mg by mouth every 6 (six) hours as needed for moderate pain.    Historical Provider, MD  diphenhydrAMINE (BENADRYL) 25 MG tablet Take 1 tablet (25 mg total) by mouth every 6 (six) hours. X 3 days then PRN itching, swelling, allergic reaction Patient not taking: Reported on 01/07/2015 10/17/14   [name], PA-C  doxycycline (VIBRAMYCIN) 100 MG capsule Take 1 capsule (100 mg total) by mouth 2 (two) times daily. One po bid x 10 days Patient not taking: Reported on 01/07/2015 10/15/14   Domenic Moras, PA-C  famotidine (PEPCID) 20 MG tablet Take 1 tablet (20 mg total) by mouth 2 (two) times daily. X 3 days then as needed Patient not taking: Reported on 01/07/2015 10/17/14   [name], PA-C  ketorolac (TORADOL) 10 MG tablet Take 1 tablet (10 mg total) by mouth every 6 (six) hours as needed. Patient not taking: Reported on 01/07/2015 11/03/14   Harvie Heck, PA-C  naproxen (NAPROSYN) 500 MG tablet Take 1 tablet (500 mg total) by mouth 2 (two) times daily. Patient not taking: Reported on 01/07/2015 10/15/14   Domenic Moras, PA-C  predniSONE (STERAPRED UNI-PAK) 10 MG tablet Take by mouth  daily. Day 1: take 6 tabs.  Day 2: 5 tabs  Day 3: 4 tabs  Day 4: 3 tabs  Day 5: 2 tabs  Day 6: 1 tab Patient not taking: Reported on 01/07/2015 10/17/14   [name], PA-C  traMADol (ULTRAM) 50 MG tablet Take 1 tablet (50 mg total) by mouth every 6 (six) hours as needed. Patient not taking: Reported on 01/07/2015 11/03/14   Harvie Heck, PA-C   BP 119/47 mmHg  Pulse 76  Temp(Src) 98.8 F (37.1 C) (Oral)  Resp 18  SpO2 98% Physical Exam  Constitutional: She appears well-developed and well-nourished. No distress.  HENT:  Head:  Normocephalic.  Right Ear: External ear normal.  Left Ear: External ear normal.  Mildly erythematous, no tonsillar exudate, no abscess, no stridor, uvula is midline  TMs clear bilaterally  Eyes: Conjunctivae and EOM are normal. Pupils are equal, round, and reactive to light.  Neck: Normal range of motion. Neck supple.  Cardiovascular: Normal rate, regular rhythm and normal heart sounds.  Exam reveals no gallop and no friction rub.   No murmur heard. Pulmonary/Chest: Effort normal and breath sounds normal. No stridor. No respiratory distress. She has no wheezes. She has no rales. She exhibits no tenderness.  CTAB  Abdominal: Soft. Bowel sounds are normal. She exhibits no distension. There is no tenderness.  Musculoskeletal: Normal range of motion. She exhibits no tenderness.  Neurological: She is alert.  Skin: Skin is warm and dry. No rash noted. She is not diaphoretic.  Psychiatric: She has a normal mood and affect. Her behavior is normal. Judgment and thought content normal.  Nursing note and vitals reviewed.   ED Course  Procedures (including critical care time) Labs Review Labs Reviewed - No data to display  Imaging Review Dg Chest 2 View  02/14/2015   CLINICAL DATA:  Cough, midline chest pain, shortness of breath  EXAM: CHEST  2 VIEW  COMPARISON:  05/11/2014  FINDINGS: The heart size and mediastinal contours are within normal limits. Both lungs are clear. The visualized skeletal structures are unremarkable.  IMPRESSION: No active cardiopulmonary disease.   Electronically Signed   By: Kathreen Devoid   On: 02/14/2015 10:48     EKG Interpretation None      MDM   Final diagnoses:  Cough    Patient with cough. Will check chest x-ray given productive nature and length of symptoms. If negative, plan for discharge to home with symptomatic care.  Pt CXR negative for acute infiltrate. Patients symptoms are consistent with URI, likely viral etiology. Discussed that antibiotics are  not indicated for viral infections. Pt will be discharged with symptomatic treatment.  Verbalizes understanding and is agreeable with plan. Pt is hemodynamically stable & in NAD prior to dc.   Montine Circle, PA-C 02/14/15 1105  Blanchie Dessert, MD 02/14/15 [phone]

## 2015-02-14 NOTE — Discharge Instructions (Signed)
Upper Respiratory Infection, Adult An upper respiratory infection (URI) is also sometimes known as the common cold. The upper respiratory tract includes the nose, sinuses, throat, trachea, and bronchi. Bronchi are the airways leading to the lungs. Most people improve within 1 week, but symptoms can last up to 2 weeks. A residual cough may last even longer.  CAUSES Many different viruses can infect the tissues lining the upper respiratory tract. The tissues become irritated and inflamed and often become very moist. Mucus production is also common. A cold is contagious. You can easily spread the virus to others by oral contact. This includes kissing, sharing a glass, coughing, or sneezing. Touching your mouth or nose and then touching a surface, which is then touched by another person, can also spread the virus. SYMPTOMS  Symptoms typically develop 1 to 3 days after you come in contact with a cold virus. Symptoms vary from person to person. They may include:  Runny nose.  Sneezing.  Nasal congestion.  Sinus irritation.  Sore throat.  Loss of voice (laryngitis).  Cough.  Fatigue.  Muscle aches.  Loss of appetite.  Headache.  Low-grade fever. DIAGNOSIS  You might diagnose your own cold based on familiar symptoms, since most people get a cold 2 to 3 times a year. Your caregiver can confirm this based on your exam. Most importantly, your caregiver can check that your symptoms are not due to another disease such as strep throat, sinusitis, pneumonia, asthma, or epiglottitis. Blood tests, throat tests, and X-rays are not necessary to diagnose a common cold, but they may sometimes be helpful in excluding other more serious diseases. Your caregiver will decide if any further tests are required. RISKS AND COMPLICATIONS  You may be at risk for a more severe case of the common cold if you smoke cigarettes, have chronic heart disease (such as heart failure) or lung disease (such as asthma), or if  you have a weakened immune system. The very young and very old are also at risk for more serious infections. Bacterial sinusitis, middle ear infections, and bacterial pneumonia can complicate the common cold. The common cold can worsen asthma and chronic obstructive pulmonary disease (COPD). Sometimes, these complications can require emergency medical care and may be life-threatening. PREVENTION  The best way to protect against getting a cold is to practice good hygiene. Avoid oral or hand contact with people with cold symptoms. Wash your hands often if contact occurs. There is no clear evidence that vitamin C, vitamin E, echinacea, or exercise reduces the chance of developing a cold. However, it is always recommended to get plenty of rest and practice good nutrition. TREATMENT  Treatment is directed at relieving symptoms. There is no cure. Antibiotics are not effective, because the infection is caused by a virus, not by bacteria. Treatment may include:  Increased fluid intake. Sports drinks offer valuable electrolytes, sugars, and fluids.  Breathing heated mist or steam (vaporizer or shower).  Eating chicken soup or other clear broths, and maintaining good nutrition.  Getting plenty of rest.  Using gargles or lozenges for comfort.  Controlling fevers with ibuprofen or acetaminophen as directed by your caregiver.  Increasing usage of your inhaler if you have asthma. Zinc gel and zinc lozenges, taken in the first 24 hours of the common cold, can shorten the duration and lessen the severity of symptoms. Pain medicines may help with fever, muscle aches, and throat pain. A variety of non-prescription medicines are available to treat congestion and runny nose. Your caregiver   can make recommendations and may suggest nasal or lung inhalers for other symptoms.  HOME CARE INSTRUCTIONS   Only take over-the-counter or prescription medicines for pain, discomfort, or fever as directed by your  caregiver.  Use a warm mist humidifier or inhale steam from a shower to increase air moisture. This may keep secretions moist and make it easier to breathe.  Drink enough water and fluids to keep your urine clear or pale yellow.  Rest as needed.  Return to work when your temperature has returned to normal or as your caregiver advises. You may need to stay home longer to avoid infecting others. You can also use a face mask and careful hand washing to prevent spread of the virus. SEEK MEDICAL CARE IF:   After the first few days, you feel you are getting worse rather than better.  You need your caregiver's advice about medicines to control symptoms.  You develop chills, worsening shortness of breath, or brown or red sputum. These may be signs of pneumonia.  You develop yellow or brown nasal discharge or pain in the face, especially when you bend forward. These may be signs of sinusitis.  You develop a fever, swollen neck glands, pain with swallowing, or white areas in the back of your throat. These may be signs of strep throat. SEEK IMMEDIATE MEDICAL CARE IF:   You have a fever.  You develop severe or persistent headache, ear pain, sinus pain, or chest pain.  You develop wheezing, a prolonged cough, cough up blood, or have a change in your usual mucus (if you have chronic lung disease).  You develop sore muscles or a stiff neck. Document Released: 06/12/2001 Document Revised: 03/10/2012 Document Reviewed: 03/24/2014 [employer] Patient Information 2015 [employer]. This information is not intended to replace advice given to you by your health care provider. Make sure you discuss any questions you have with your health care provider.  

## 2015-05-22 ENCOUNTER — Emergency Department (HOSPITAL_COMMUNITY)
Admission: EM | Admit: 2015-05-22 | Discharge: 2015-05-23 | Disposition: A | Discharge status: Home / Self Care | Payer: Medicaid Other | Attending: Emergency Medicine | Admitting: Emergency Medicine

## 2015-05-22 ENCOUNTER — Encounter (HOSPITAL_COMMUNITY): Payer: Self-pay

## 2015-05-22 DIAGNOSIS — Z87891 Personal history of nicotine dependence: Secondary | ICD-10-CM | POA: Diagnosis not present

## 2015-05-22 DIAGNOSIS — R42 Dizziness and giddiness: Secondary | ICD-10-CM | POA: Insufficient documentation

## 2015-05-22 DIAGNOSIS — Y929 Unspecified place or not applicable: Secondary | ICD-10-CM | POA: Diagnosis not present

## 2015-05-22 DIAGNOSIS — Y939 Activity, unspecified: Secondary | ICD-10-CM | POA: Insufficient documentation

## 2015-05-22 DIAGNOSIS — T7840XA Allergy, unspecified, initial encounter: Secondary | ICD-10-CM | POA: Diagnosis present

## 2015-05-22 DIAGNOSIS — Z91040 Latex allergy status: Secondary | ICD-10-CM | POA: Insufficient documentation

## 2015-05-22 DIAGNOSIS — Z872 Personal history of diseases of the skin and subcutaneous tissue: Secondary | ICD-10-CM | POA: Insufficient documentation

## 2015-05-22 DIAGNOSIS — R0789 Other chest pain: Secondary | ICD-10-CM | POA: Diagnosis not present

## 2015-05-22 DIAGNOSIS — Z880 Allergy status to penicillin: Secondary | ICD-10-CM | POA: Diagnosis not present

## 2015-05-22 DIAGNOSIS — X58XXXA Exposure to other specified factors, initial encounter: Secondary | ICD-10-CM | POA: Insufficient documentation

## 2015-05-22 DIAGNOSIS — Z3202 Encounter for pregnancy test, result negative: Secondary | ICD-10-CM | POA: Diagnosis not present

## 2015-05-22 DIAGNOSIS — Z8742 Personal history of other diseases of the female genital tract: Secondary | ICD-10-CM | POA: Diagnosis not present

## 2015-05-22 DIAGNOSIS — Y998 Other external cause status: Secondary | ICD-10-CM | POA: Diagnosis not present

## 2015-05-22 DIAGNOSIS — R112 Nausea with vomiting, unspecified: Secondary | ICD-10-CM | POA: Diagnosis not present

## 2015-05-22 MED ORDER — METHYLPREDNISOLONE SODIUM SUCC 125 MG IJ SOLR
125.0000 mg | Freq: Once | INTRAMUSCULAR | Status: AC
  Administered 2015-05-22: 125 mg via INTRAVENOUS
  Filled 2015-05-22: qty 2

## 2015-05-22 MED ORDER — PREDNISONE 20 MG PO TABS: 60.0000 mg | ORAL_TABLET | Freq: Once | ORAL | Status: DC

## 2015-05-22 MED ORDER — FAMOTIDINE 20 MG PO TABS
20.0000 mg | ORAL_TABLET | Freq: Once | ORAL | Status: AC
  Administered 2015-05-22: 20 mg via ORAL
  Filled 2015-05-22: qty 1

## 2015-05-22 NOTE — ED Notes (Signed)
Pt comes from home via [facility], eating with family, pt was eating mushrooms, known allergy, pt started gagging and then labored breathing, per moms reports to EMS. Fire dept gave Epi pen in R thigh. PTA 50 mg benadryl IM and 4 mg zofran. No hives noted, lungs clear.

## 2015-05-22 NOTE — ED Provider Notes (Signed)
CSN: [id number]     Arrival date & time 05/22/15  2040 History   First MD Initiated Contact with Patient 05/22/15 2041     Chief Complaint  Patient presents with  . Allergic Reaction     (Consider location/radiation/quality/duration/timing/severity/associated sxs/prior Treatment) HPI Comments: Patient presents after an apparent allergic reaction occurring just prior to arrival. Patient was eating lasagna that had mushrooms in it. 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 Patient started having tightness in her throat and was dry heaving. EMS was called and patient was administered an epinephrine pen by the fire department. She was also given Benadryl and Zofran prior to arrival. Patient felt somewhat better after administration of epinephrine. Patient states that she has had some right flank pain which has been ongoing for 3 days. She denies nausea, vomiting, diarrhea, urinary symptoms prior to the incident tonight. Patient did not have hives at any time. She did not have lip or tongue swelling. No syncope. The onset of this condition was acute. The course is resolved.    The history is provided by the patient and medical records.    Past Medical History  Diagnosis Date  . Allergy     Penicillin  . Eczema   . Chlamydia trachomatis infection in pregnancy   . GBS (group B streptococcus) UTI complicating pregnancy 0/62/3762  . Endometriosis   . PID (acute pelvic inflammatory disease)    Past Surgical History  Procedure Laterality Date  . Foot surgery     Family History  Problem Relation Age of Onset  . Depression Mother    History  Substance Use Topics  . Smoking status: Former Smoker -- 0.25 packs/day    Types: Cigarettes  . Smokeless tobacco: Never Used  . Alcohol Use: No   OB History    Gravida Para Term Preterm AB TAB SAB Ectopic Multiple Living   1 1 1       1      Review of Systems  Constitutional: Negative for fever.  HENT: Negative for facial  swelling and trouble swallowing.   Eyes: Negative for redness.  Respiratory: Positive for chest tightness. Negative for shortness of breath, wheezing and stridor.   Cardiovascular: Negative for chest pain.  Gastrointestinal: Positive for nausea and vomiting.  Musculoskeletal: Negative for myalgias.  Skin: Negative for rash.  Neurological: Negative for syncope and light-headedness.  Psychiatric/Behavioral: Negative for confusion.      Allergies  Bee venom; Mushroom extract complex; Latex; and Penicillins  Home Medications   Prior to Admission medications   Medication Sig Start Date End Date Taking? Authorizing Provider  acetaminophen (TYLENOL) 500 MG tablet Take 1,000 mg by mouth every 6 (six) hours as needed for moderate pain.    Historical Provider, MD  diphenhydrAMINE (BENADRYL) 25 MG tablet Take 1 tablet (25 mg total) by mouth every 6 (six) hours. X 3 days then PRN itching, swelling, allergic reaction Patient not taking: Reported on 01/07/2015 10/17/14   [name], PA-C  doxycycline (VIBRAMYCIN) 100 MG capsule Take 1 capsule (100 mg total) by mouth 2 (two) times daily. One po bid x 10 days Patient not taking: Reported on 01/07/2015 10/15/14   Domenic Moras, PA-C  famotidine (PEPCID) 20 MG tablet Take 1 tablet (20 mg total) by mouth 2 (two) times daily. X 3 days then as needed Patient not taking: Reported on 01/07/2015 10/17/14   [name], PA-C  guaiFENesin (ROBITUSSIN) 100 MG/5ML liquid Take 5-10 mLs (100-200 mg total) by mouth every  4 (four) hours as needed for cough. 02/14/15   Montine Circle, PA-C  ketorolac (TORADOL) 10 MG tablet Take 1 tablet (10 mg total) by mouth every 6 (six) hours as needed. Patient not taking: Reported on 01/07/2015 11/03/14   Harvie Heck, PA-C  naproxen (NAPROSYN) 500 MG tablet Take 1 tablet (500 mg total) by mouth 2 (two) times daily. Patient not taking: Reported on 01/07/2015 10/15/14   Domenic Moras, PA-C  predniSONE (STERAPRED UNI-PAK) 10 MG tablet Take by mouth  daily. Day 1: take 6 tabs.  Day 2: 5 tabs  Day 3: 4 tabs  Day 4: 3 tabs  Day 5: 2 tabs  Day 6: 1 tab Patient not taking: Reported on 01/07/2015 10/17/14   [name], PA-C  traMADol (ULTRAM) 50 MG tablet Take 1 tablet (50 mg total) by mouth every 6 (six) hours as needed. Patient not taking: Reported on 01/07/2015 11/03/14   Harvie Heck, PA-C   Ht 5' (1.524 m)  Wt 125 lb (56.7 kg)  BMI 24.41 kg/m2  SpO2 100%   Physical Exam  Constitutional: She appears well-developed and well-nourished.  HENT:  Head: Normocephalic and atraumatic.  Eyes: Conjunctivae are normal. Right eye exhibits no discharge. Left eye exhibits no discharge.  Neck: Normal range of motion. Neck supple.  Cardiovascular: Normal rate and regular rhythm.   No murmur heard. Pulmonary/Chest: Effort normal and breath sounds normal. No respiratory distress. She has no wheezes. She has no rales.  Abdominal: Soft. There is no tenderness. There is no rebound, no guarding and no CVA tenderness.  Neurological: She is alert.  Skin: Skin is warm and dry.  Psychiatric: She has a normal mood and affect.  Nursing note and vitals reviewed.   ED Course  Procedures (including critical care time) Labs Review Labs Reviewed  URINALYSIS, ROUTINE W REFLEX MICROSCOPIC - Abnormal; Notable for the following:    Hgb urine dipstick SMALL (*)    Ketones, ur 15 (*)    All other components within normal limits  URINE MICROSCOPIC-ADD ON  POC URINE PREG, ED    Imaging Review No results found.   EKG Interpretation   Date/Time:  Sunday May 22 2015 20:56:37 EDT Ventricular Rate:  69 PR Interval:  160 QRS Duration: 78 QT Interval:  380 QTC Calculation: 407 R Axis:   99 Text Interpretation:  Sinus rhythm Borderline right axis deviation Sinus  rhythm Non-specific intra-ventricular conduction delay Abnormal ekg  Confirmed by Carmin Muskrat  MD [phone]) on 05/22/2015 9:55:34 PM      8:54 PM Patient seen and examined. She is stable. No current  signs of anaphylaxis. Will complete treatment with PO pepcid and IV solumedrol. Will check urine given c/o L sided abd pain x 3 days.   Vital signs reviewed and are as follows: Ht 5' (1.524 m)  Wt 125 lb (56.7 kg)  BMI 24.41 kg/m2  SpO2 100%  12:46 AM patient stable during ED stay. Urine checked due to reported flank pain. Urine pregnancy is negative. No signs of infection on UA. Will discharge to home with prescription for prednisone, Benadryl, Pepcid, and epinephrine autoinjector.   MDM   Final diagnoses:  Dizziness  Allergic reaction, initial encounter   Patient with probable anaphylactic reaction. Patient treated with epinephrine prior to arrival. Symptoms improved. Stable while in ED. Patient has had pre-existing right flank pain. She is nontender on exam. This is not her primary complaint. Vital signs are negative. No concerning history. Urine checked to ensure no urinary infection  and this was negative. Do not feel that further evaluation with blood tests were CT scan is indicated at this time. Patient appears well, nontoxic. Family to monitor at her at home.    Carlisle Cater, PA-C 05/23/15 [place], MD 06/01/15 [phone]

## 2015-05-22 NOTE — ED Notes (Signed)
Patient aware of need for urine specimen.  Drinking water at this time per MD.

## 2015-05-22 NOTE — ED Notes (Addendum)
Placed pt in a gown and hooked up to the monitor with a 5 lead, BP cuff and pulse ox

## 2015-05-23 LAB — URINALYSIS, ROUTINE W REFLEX MICROSCOPIC
Bilirubin Urine: NEGATIVE
GLUCOSE, UA: NEGATIVE mg/dL
KETONES UR: 15 mg/dL — AB
LEUKOCYTES UA: NEGATIVE
Nitrite: NEGATIVE
Protein, ur: NEGATIVE mg/dL
SPECIFIC GRAVITY, URINE: 1.027 (ref 1.005–1.030)
Urobilinogen, UA: 1 mg/dL (ref 0.0–1.0)
pH: 6 (ref 5.0–8.0)

## 2015-05-23 LAB — URINE MICROSCOPIC-ADD ON

## 2015-05-23 LAB — POC URINE PREG, ED: Preg Test, Ur: NEGATIVE

## 2015-05-23 MED ORDER — EPINEPHRINE 0.3 MG/0.3ML IJ SOAJ: 0.3000 mg | Freq: Once | INTRAMUSCULAR | Status: DC

## 2015-05-23 MED ORDER — PREDNISONE 20 MG PO TABS: 40.0000 mg | ORAL_TABLET | Freq: Every day | ORAL | Status: DC

## 2015-05-23 MED ORDER — FAMOTIDINE 20 MG PO TABS: 20.0000 mg | ORAL_TABLET | Freq: Two times a day (BID) | ORAL | Status: DC

## 2015-05-23 MED ORDER — DIPHENHYDRAMINE HCL 25 MG PO TABS: 25.0000 mg | ORAL_TABLET | Freq: Four times a day (QID) | ORAL | Status: DC

## 2015-05-23 NOTE — Discharge Instructions (Signed)
Please read and follow all provided instructions.  Your diagnoses today include:  1. Allergic reaction, initial encounter   2. Dizziness     Tests performed today include:  Vital signs. See below for your results today.   Urine test  Medications prescribed:   Prednisone - steroid medicine   It is best to take this medication in the morning to prevent sleeping problems. If you are diabetic, monitor your blood sugar closely and stop taking Prednisone if blood sugar is over 300. Take with food to prevent stomach upset.    Benadryl (diphenhydramine) - antihistamine  You can find this medication over-the-counter.   DO NOT exceed:   50mg  Benadryl every 6 hours    Benadryl will make you drowsy. DO NOT drive or perform any activities that require you to be awake and alert if taking this.   Pepcid (famotidine) - antihistamine  You can find this medication over-the-counter.   DO NOT exceed:   20mg  Pepcid every 12 hours   Epi-pen  Inject into thigh as directed if you have a severe reaction that causes throat swelling or any trouble breathing. Call 9-1-1 immediately if you use an Epi-pen. You should be evaluated at a hospital as soon as possible.     Take any prescribed medications only as directed.  Home care instructions:   Follow any educational materials contained in this packet  Follow-up instructions: Please follow-up with your primary care provider in the next 3 days for further evaluation of your symptoms.   Return instructions:   Please return to the Emergency Department if you experience worsening symptoms.   Call 9-1-1 immediately if you have an allergic reaction that involves your lips, mouth, throat or if you have any difficulty breathing. This is a life-threatening emergency.   Please return if you have any other emergent concerns.  Additional Information:  Your vital signs today were: BP 102/61 mmHg   Pulse 67   Temp(Src) 98.4 F (36.9 C) (Oral)    Resp 16   Ht 5' (1.524 m)   Wt 125 lb (56.7 kg)   BMI 24.41 kg/m2   SpO2 100% If your blood pressure (BP) was elevated above 135/85 this visit, please have this repeated by your doctor within one month. --------------

## 2015-05-31 ENCOUNTER — Emergency Department (HOSPITAL_COMMUNITY)
Admission: EM | Admit: 2015-05-31 | Discharge: 2015-06-01 | Disposition: A | Discharge status: Home / Self Care | Payer: Medicaid Other | Attending: Emergency Medicine | Admitting: Emergency Medicine

## 2015-05-31 ENCOUNTER — Encounter (HOSPITAL_COMMUNITY): Payer: Self-pay | Admitting: *Deleted

## 2015-05-31 DIAGNOSIS — Z91040 Latex allergy status: Secondary | ICD-10-CM | POA: Diagnosis not present

## 2015-05-31 DIAGNOSIS — Z872 Personal history of diseases of the skin and subcutaneous tissue: Secondary | ICD-10-CM | POA: Diagnosis not present

## 2015-05-31 DIAGNOSIS — Z8619 Personal history of other infectious and parasitic diseases: Secondary | ICD-10-CM | POA: Diagnosis not present

## 2015-05-31 DIAGNOSIS — D1809 Hemangioma of other sites: Secondary | ICD-10-CM | POA: Diagnosis not present

## 2015-05-31 DIAGNOSIS — F4320 Adjustment disorder, unspecified: Secondary | ICD-10-CM | POA: Diagnosis not present

## 2015-05-31 DIAGNOSIS — R410 Disorientation, unspecified: Secondary | ICD-10-CM | POA: Diagnosis not present

## 2015-05-31 DIAGNOSIS — Z8742 Personal history of other diseases of the female genital tract: Secondary | ICD-10-CM | POA: Diagnosis not present

## 2015-05-31 DIAGNOSIS — Z87891 Personal history of nicotine dependence: Secondary | ICD-10-CM | POA: Diagnosis not present

## 2015-05-31 DIAGNOSIS — Z880 Allergy status to penicillin: Secondary | ICD-10-CM | POA: Diagnosis not present

## 2015-05-31 DIAGNOSIS — G4700 Insomnia, unspecified: Secondary | ICD-10-CM | POA: Insufficient documentation

## 2015-05-31 DIAGNOSIS — Z79899 Other long term (current) drug therapy: Secondary | ICD-10-CM | POA: Diagnosis not present

## 2015-05-31 DIAGNOSIS — Q283 Other malformations of cerebral vessels: Secondary | ICD-10-CM

## 2015-05-31 DIAGNOSIS — R4182 Altered mental status, unspecified: Secondary | ICD-10-CM | POA: Diagnosis present

## 2015-05-31 DIAGNOSIS — F4321 Adjustment disorder with depressed mood: Secondary | ICD-10-CM

## 2015-05-31 DIAGNOSIS — Z3202 Encounter for pregnancy test, result negative: Secondary | ICD-10-CM | POA: Diagnosis not present

## 2015-05-31 LAB — CBC WITH DIFFERENTIAL/PLATELET
Basophils Absolute: 0 10*3/uL (ref 0.0–0.1)
Basophils Relative: 0 % (ref 0–1)
EOS PCT: 0 % (ref 0–5)
Eosinophils Absolute: 0 10*3/uL (ref 0.0–0.7)
HCT: 35.1 % — ABNORMAL LOW (ref 36.0–46.0)
Hemoglobin: 12.4 g/dL (ref 12.0–15.0)
LYMPHS ABS: 4.2 10*3/uL — AB (ref 0.7–4.0)
LYMPHS PCT: 52 % — AB (ref 12–46)
MCH: 30.5 pg (ref 26.0–34.0)
MCHC: 35.3 g/dL (ref 30.0–36.0)
MCV: 86.5 fL (ref 78.0–100.0)
MONOS PCT: 8 % (ref 3–12)
Monocytes Absolute: 0.7 10*3/uL (ref 0.1–1.0)
Neutro Abs: 3.3 10*3/uL (ref 1.7–7.7)
Neutrophils Relative %: 40 % — ABNORMAL LOW (ref 43–77)
PLATELETS: 268 10*3/uL (ref 150–400)
RBC: 4.06 MIL/uL (ref 3.87–5.11)
RDW: 12 % (ref 11.5–15.5)
WBC: 8.3 10*3/uL (ref 4.0–10.5)

## 2015-05-31 LAB — URINALYSIS, ROUTINE W REFLEX MICROSCOPIC
BILIRUBIN URINE: NEGATIVE
Glucose, UA: NEGATIVE mg/dL
Hgb urine dipstick: NEGATIVE
KETONES UR: NEGATIVE mg/dL
Leukocytes, UA: NEGATIVE
NITRITE: NEGATIVE
PH: 7 (ref 5.0–8.0)
PROTEIN: NEGATIVE mg/dL
Specific Gravity, Urine: 1.024 (ref 1.005–1.030)
Urobilinogen, UA: 0.2 mg/dL (ref 0.0–1.0)

## 2015-05-31 MED ORDER — METHYLPREDNISOLONE SODIUM SUCC 125 MG IJ SOLR: 125.0000 mg | Freq: Once | INTRAMUSCULAR | Status: DC

## 2015-05-31 MED ORDER — SODIUM CHLORIDE 0.9 % IV BOLUS (SEPSIS)
500.0000 mL | Freq: Once | INTRAVENOUS | Status: AC
  Administered 2015-05-31: 500 mL via INTRAVENOUS

## 2015-05-31 NOTE — ED Provider Notes (Signed)
CSN: [id number]     Arrival date & time 05/31/15  2218 History   First MD Initiated Contact with Patient 05/31/15 2244     Chief Complaint  Patient presents with  . Altered Mental Status     (Consider location/radiation/quality/duration/timing/severity/associated sxs/prior Treatment) HPI Comments: Patient presents to the ER for evaluation of shaking, burning sensation, warmth to her skin. She felt dizzy, weak like she was going to pass out. She became acutely confused, did not know who her family was. Patient has not been sleeping well recently. She was recently seen in the ER for anaphylaxis secondary to mushroom ingestion. She has been taking Benadryl since that time and has been extremely agitated on the Benadryl, unable to sleep. Her sister recently died and she has been having trouble with grief since that event occurred. She is not homicidal or suicidal.  Patient is a 19 y.o. female presenting with altered mental status.  Altered Mental Status Presenting symptoms: confusion   Associated symptoms: no rash     Past Medical History  Diagnosis Date  . Allergy     Penicillin  . Eczema   . Chlamydia trachomatis infection in pregnancy   . GBS (group B streptococcus) UTI complicating pregnancy 0/99/8338  . Endometriosis   . PID (acute pelvic inflammatory disease)    Past Surgical History  Procedure Laterality Date  . Foot surgery     Family History  Problem Relation Age of Onset  . Depression Mother    History  Substance Use Topics  . Smoking status: Former Smoker -- 0.25 packs/day    Types: Cigarettes  . Smokeless tobacco: Never Used  . Alcohol Use: No   OB History    Gravida Para Term Preterm AB TAB SAB Ectopic Multiple Living   1 1 1       1      Review of Systems  Skin: Negative for rash.  Neurological: Positive for dizziness.  Psychiatric/Behavioral: Positive for confusion and sleep disturbance.  All other systems reviewed and are negative.     Allergies   Bee venom; Mushroom extract complex; Latex; and Penicillins  Home Medications   Prior to Admission medications   Medication Sig Start Date End Date Taking? Authorizing Provider  diphenhydrAMINE (BENADRYL) 25 MG tablet Take 1 tablet (25 mg total) by mouth every 6 (six) hours. 05/23/15  Yes Carlisle Cater, PA-C  EPINEPHrine 0.3 mg/0.3 mL IJ SOAJ injection Inject 0.3 mLs (0.3 mg total) into the muscle once. 05/23/15  Yes Carlisle Cater, PA-C  famotidine (PEPCID) 20 MG tablet Take 1 tablet (20 mg total) by mouth 2 (two) times daily. 05/23/15  Yes Carlisle Cater, PA-C  predniSONE (DELTASONE) 20 MG tablet Take 2 tablets (40 mg total) by mouth daily. 05/23/15  Yes Carlisle Cater, PA-C   BP 119/79 mmHg  Pulse 59  Temp(Src) 99 F (37.2 C) (Oral)  Resp 17  Ht 5\' 1"  (1.549 m)  Wt 125 lb (56.7 kg)  BMI 23.63 kg/m2  SpO2 97%  LMP 05/30/2015 (Exact Date) Physical Exam  Constitutional: She is oriented to person, place, and time. She appears well-developed and well-nourished. No distress.  HENT:  Head: Normocephalic and atraumatic.  Right Ear: Hearing normal.  Left Ear: Hearing normal.  Nose: Nose normal.  Mouth/Throat: Oropharynx is clear and moist and mucous membranes are normal.  Eyes: Conjunctivae and EOM are normal. Pupils are equal, round, and reactive to light.  Neck: Normal range of motion. Neck supple.  Cardiovascular: Regular rhythm, S1 normal and  S2 normal.  Exam reveals no gallop and no friction rub.   No murmur heard. Pulmonary/Chest: Effort normal and breath sounds normal. No respiratory distress. She exhibits no tenderness.  Abdominal: Soft. Normal appearance and bowel sounds are normal. There is no hepatosplenomegaly. There is no tenderness. There is no rebound, no guarding, no tenderness at McBurney's point and negative Murphy's sign. No hernia.  Musculoskeletal: Normal range of motion.  Neurological: She is alert and oriented to person, place, and time. She has normal strength. No  cranial nerve deficit or sensory deficit. Coordination normal. GCS eye subscore is 4. GCS verbal subscore is 5. GCS motor subscore is 6.  Skin: Skin is warm, dry and intact. No rash noted. No cyanosis.  Psychiatric: She has a normal mood and affect. Her speech is normal and behavior is normal. Thought content normal.  Nursing note and vitals reviewed.   ED Course  Procedures (including critical care time) Labs Review Labs Reviewed  CBC WITH DIFFERENTIAL/PLATELET - Abnormal; Notable for the following:    HCT 35.1 (*)    Neutrophils Relative % 40 (*)    Lymphocytes Relative 52 (*)    Lymphs Abs 4.2 (*)    All other components within normal limits  COMPREHENSIVE METABOLIC PANEL - Abnormal; Notable for the following:    Potassium 3.4 (*)    AST 12 (*)    ALT 11 (*)    All other components within normal limits  URINALYSIS, ROUTINE W REFLEX MICROSCOPIC (NOT AT [facility]) - Abnormal; Notable for the following:    APPearance CLOUDY (*)    All other components within normal limits  URINE RAPID DRUG SCREEN (HOSP PERFORMED) NOT AT [facility] - Abnormal; Notable for the following:    Tetrahydrocannabinol POSITIVE (*)    All other components within normal limits  PREGNANCY, URINE    Imaging Review No results found.   EKG Interpretation None      MDM   Final diagnoses:  Mental status change  Grief reaction  Insomnia    Patient presents to the ER for evaluation of multiple problems. Patient reports that she has not been sleeping well over the last several days since starting taking Benadryl and prednisone for an allergic reaction. Patient reports that she has been feeling poorly and over this period of time. She has had some intermittent episodes of confusion, most recently today before arrival to the ER. She has had anxiety, agitation. She has been feeling dizziness. Earlier today she felt like her skin was burning and warm to the touch. No rashes noted here in the ER. She has a nonfocal  neurologic examination. Labs are unremarkable. CT scan will be performed.  That she is having significant grief reaction to the loss of her sister. She is asking for counseling. Patient will have TTS consultation after CT scan returns and is negative.    Orpah Greek, MD 06/01/15 [phone]

## 2015-05-31 NOTE — ED Notes (Signed)
Pt arrives from home via EMS. Pt c/o shaking and burning sensation all over her body. Pt has also been having some confusion, she can not tell you who mom is. She was treated about a week ago for anaphylaxis reaction to mushrooms. Pt has also been suffering from insomnia per mom. Pt denies SI/HI but wants help with grief counseling as her sister past away recently. [id number]

## 2015-06-01 ENCOUNTER — Emergency Department (HOSPITAL_COMMUNITY): Payer: Medicaid Other

## 2015-06-01 ENCOUNTER — Encounter (HOSPITAL_COMMUNITY): Payer: Self-pay

## 2015-06-01 DIAGNOSIS — R4182 Altered mental status, unspecified: Secondary | ICD-10-CM | POA: Diagnosis not present

## 2015-06-01 DIAGNOSIS — R410 Disorientation, unspecified: Secondary | ICD-10-CM | POA: Diagnosis not present

## 2015-06-01 LAB — COMPREHENSIVE METABOLIC PANEL
ALBUMIN: 3.9 g/dL (ref 3.5–5.0)
ALK PHOS: 41 U/L (ref 38–126)
ALT: 11 U/L — AB (ref 14–54)
AST: 12 U/L — ABNORMAL LOW (ref 15–41)
Anion gap: 11 (ref 5–15)
BUN: 10 mg/dL (ref 6–20)
CHLORIDE: 103 mmol/L (ref 101–111)
CO2: 23 mmol/L (ref 22–32)
Calcium: 9.1 mg/dL (ref 8.9–10.3)
Creatinine, Ser: 0.6 mg/dL (ref 0.44–1.00)
Glucose, Bld: 84 mg/dL (ref 65–99)
Potassium: 3.4 mmol/L — ABNORMAL LOW (ref 3.5–5.1)
Sodium: 137 mmol/L (ref 135–145)
TOTAL PROTEIN: 6.6 g/dL (ref 6.5–8.1)
Total Bilirubin: 0.4 mg/dL (ref 0.3–1.2)

## 2015-06-01 LAB — RAPID URINE DRUG SCREEN, HOSP PERFORMED
Amphetamines: NOT DETECTED
Barbiturates: NOT DETECTED
Benzodiazepines: NOT DETECTED
Cocaine: NOT DETECTED
OPIATES: NOT DETECTED
Tetrahydrocannabinol: POSITIVE — AB

## 2015-06-01 LAB — PREGNANCY, URINE: PREG TEST UR: NEGATIVE

## 2015-06-01 MED ORDER — ONDANSETRON HCL 4 MG PO TABS: 4.0000 mg | ORAL_TABLET | Freq: Three times a day (TID) | ORAL | Status: DC | PRN

## 2015-06-01 MED ORDER — ACETAMINOPHEN 325 MG PO TABS: 650.0000 mg | ORAL_TABLET | ORAL | Status: DC | PRN

## 2015-06-01 MED ORDER — LORAZEPAM 1 MG PO TABS: 1.0000 mg | ORAL_TABLET | Freq: Three times a day (TID) | ORAL | Status: DC | PRN

## 2015-06-01 MED ORDER — IBUPROFEN 400 MG PO TABS: 600.0000 mg | ORAL_TABLET | Freq: Three times a day (TID) | ORAL | Status: DC | PRN

## 2015-06-01 MED ORDER — ZOLPIDEM TARTRATE 5 MG PO TABS: 5.0000 mg | ORAL_TABLET | Freq: Every evening | ORAL | Status: DC | PRN

## 2015-06-01 MED ORDER — GADOBENATE DIMEGLUMINE 529 MG/ML IV SOLN
10.0000 mL | Freq: Once | INTRAVENOUS | Status: AC | PRN
  Administered 2015-06-01: 10 mL via INTRAVENOUS

## 2015-06-01 NOTE — ED Provider Notes (Signed)
MRI with right frontal lobe cavernoma.  I spoke the patient bout findings.  She appears calm, does not appear altered at this time, neuro exam nml.  I spoke with Dr. Leonel Ramsay with neurology who will send someone to see her in his also ordered an EEG. I feel after eval, pt can be d/c'd w/ outpt f/u if cleared by neurology.   1. Grief reaction   2. Mental status change   3. Insomnia   4. Cerebral cavernoma      Ernestina Patches, MD 06/02/15 1008

## 2015-06-01 NOTE — Procedures (Signed)
EEG report.  Brief clinical history:  19 y.o. female who experienced an episode of confusion with inability to recognize family members as well as unable to remember her name. Patient indicated she felt dizzy and weak during this spell. Family members said that she was tremulous. Episode reportedly lasted for about 1 hour. Patient was aware of her surroundings and good understanding what was being said to her, but could not think of the correct responses.  Patient has been under stress over the past 10 days following the sudden loss of her sister who was 35 years old.  Technique: this is a 17 channel routine scalp EEG performed at the bedside with bipolar and monopolar montages arranged in accordance to the international 10/20 system of electrode placement. One channel was dedicated to EKG recording.  The study was performed during wakefulness, drowsiness, and stage 2 sleep. Intermittent photic stimulation was utilized as activating procedure.  Description:In the wakeful state, the best background consisted of a medium amplitude, posterior dominant, well sustained, symmetric and reactive 11 Hz rhythm. Drowsiness demonstrated dropout of the alpha rhythm. Stage 2 sleep showed symmetric and synchronous sleep spindles without intermixed epileptiform discharges. Intermittent photic stimulation did induce a normal driving response.  No focal or generalized epileptiform discharges noted.  No pathologic areas of slowing seen.  EKG showed sinus rhythm.  Impression: this is a normal awake and asleep EEG. Please, be aware that a normal EEG does not exclude the possibility of epilepsy.  Clinical correlation is advised.   Dorian Pod, MD [facility]

## 2015-06-01 NOTE — Discharge Instructions (Signed)
°Emergency Department Resource Guide °1) Find a Doctor and Pay Out of Pocket °Although you won't have to find out who is covered by your insurance plan, it is a good idea to ask around and get recommendations. You will then need to call the office and see if the doctor you have chosen will accept you as a new patient and what types of options they offer for patients who are self-pay. Some doctors offer discounts or will set up payment plans for their patients who do not have insurance, but you will need to ask so you aren't surprised when you get to your appointment. ° °2) Contact Your Local Health Department °Not all health departments have doctors that can see patients for sick visits, but many do, so it is worth a call to see if yours does. If you don't know where your local health department is, you can check in your phone book. The CDC also has a tool to help you locate your state's health department, and many state websites also have listings of all of their local health departments. ° °3) Find a Walk-in Clinic °If your illness is not likely to be very severe or complicated, you may want to try a walk in clinic. These are popping up all over the country in pharmacies, drugstores, and shopping centers. They're usually staffed by nurse practitioners or physician assistants that have been trained to treat common illnesses and complaints. They're usually fairly quick and inexpensive. However, if you have serious medical issues or chronic medical problems, these are probably not your best option. ° °No Primary Care Doctor: °- Call [facility] at  832-8000 - they can help you locate a primary care doctor that  accepts your insurance, provides certain services, etc. °- Physician Referral Service- 1-800-533-3463 ° °Chronic Pain Problems: °Organization         Address  Phone   Notes  °[facility]  (336) 297-2271 Patients need to be referred by their primary care doctor.  ° °Medication  Assistance: °Organization         Address  Phone   Notes  °[place] [facility] [address] °[place], [place] [address] (336) 641-8030 --Must be a resident of [place] °-- Must have NO insurance coverage whatsoever (no Medicaid/ Medicare, etc.) °-- The pt. MUST have a primary care doctor that directs their care regularly and follows them in the community °  °[facility]  (866) 331-1348   °[employer]  (888) 892-1162   ° °Agencies that provide inexpensive medical care: °Organization         Address  Phone   Notes  °[facility]  (336) 832-8035   °[facility]    (336) 832-7272   °[facility] [address] °[place], [place] [address] (336) 832-4777   °[facility] [address], °[place] (336) 271-4999   °[facility]    (336) 373-0678   °[facility]    (336) 272-1050   °[facility] ° [address], [place] Phone:  (336) 832-4444, Fax:  (336) 832-4440 Hours of Operation:  9 am - 6 pm, M-F.  Also accepts Medicaid/Medicare and self-pay.  °[facility] ° [address],  Phone: (336) 832-3150, Fax: (336) 832-3151. Hours of Operation:  8:30 am - 5:30 pm, M-F.  Also accepts Medicaid and self-pay.  °[facility] [place] [address], [place] Phone: (336) 878-6027   °[facility] [address], [place], [place] (336)723-1848, Ext. 123 Mondays & Thursdays: 7-9 AM.  First 15 patients are seen on a first come, first serve basis. °  ° °Medicaid-accepting [place] Providers: ° °Organization         Address  Phone   Notes  °[facility] [address], [place] (336) 641-2100 Also accepts self-pay patients.  °[facility] [address], [place] ° (336) 856-9996   °[facility] [address], [place]  (336) 288-8857   °[facility] [address] [place] [address], [place] (336) 299-7000   °Veita Bland [address], [place]  ° (336) 373-1557 Only accepts [place] Access Medicaid patients after they have their name applied to their card.  ° °Self-Pay (no insurance) in [place]: ° °Organization         Address  Phone   Notes  °Sickle Cell Patients, [facility] [address], [place] (336) 832-1970   °[facility] [address], [place] (336) 832-4400   °[facility] ° [address], [place] (336) 992-4800   °[facility]/Dr. Osei-Bonsu ° [address] [place] [address], [place] or [address], [place] (336) 841-8500 Phone number for both [place] and [place] locations is the same.  °[facility] [address], [place] (336) 299-0000   °[facility] [address] [place] [address], [place] or [address] (336) 852-7530 °(336) 878-2260   °[facility] [address], [place] (336) 350-1642, phone; (336) 294-5005, fax Sees patients 1st and 3rd Saturday of every month.  Must not qualify for public or private insurance (i.e. Medicaid, Medicare, [place] Health Choice, Veterans' Benefits) • Household income should be no more than 200% of the poverty level •The clinic cannot treat you if you are pregnant or think you are pregnant • Sexually transmitted diseases are not treated at the clinic.  ° ° °Dental Care: °Organization         Address  Phone  Notes  °[place] [facility] [address], [place] (336) 641-6152 Accepts children up to age 21 who are enrolled in Medicaid or [place] Health Choice; pregnant women with a Medicaid card; and children who have applied for Medicaid or [place] Health Choice, but were declined, whose parents can pay a reduced fee at time of service.  °[place]  [facility] [place]  [address], [place] (336) 641-7733 Accepts children up to age 21 who are enrolled in Medicaid or [place] Health Choice; pregnant women with a Medicaid card; and children who have applied for Medicaid or [place] Health Choice, but were declined, whose parents can pay a reduced fee at time of service.  °[facility] ° [address],  (336) 641-4533 Patients are seen by appointment only. Walk-ins are not accepted. [facility] will see patients 18 years of age and older. °Monday - Tuesday (8am-5pm) °Most Wednesdays (8:30-5pm) °$30 per visit, cash only  °[facility] ° [address], [place] (336) 641-4533 Patients are seen by appointment only. Walk-ins are not accepted. [facility] will see patients 18 years of age and older. °One   Wednesday Evening (Monthly: Volunteer Based).  $30 per visit, cash only  °[facility]  (919) 537-3737 for adults; Children under age 4, call Graduate Pediatric Dentistry at ([phone]) [phone]. Children aged 4-14, please call (919) [phone] to request a pediatric application. ° Dental services are provided in all areas of dental care including fillings, crowns and bridges, complete and partial dentures, implants, gum treatment, root canals, and extractions. Preventive care is also provided. Treatment is provided to both adults and children. °Patients are selected via a lottery and there is often a waiting list. °  °[facility] [address], °[place] ° (336) 763-8833 [email or web address] °  °[facility] [address], [place], [place] (336)723-1848, Ext. 123 Second and Fourth Thursday of each month, opens at 6:30 AM; Clinic ends at 9 AM.  Patients are seen on a first-come first-served basis, and a limited number are seen during each clinic.  ° °[facility] ° [address], [place], [place] (336) 723-7904    Eligibility Requirements °You must have lived in [place], or [place] counties for at least the last three months. °  You cannot be eligible for state or federal sponsored healthcare insurance, including Veterans Administration, Medicaid, or Medicare. °  You generally cannot be eligible for healthcare insurance through your employer.  °  How to apply: °Eligibility screenings are held every Tuesday and Wednesday afternoon from 1:00 pm until 4:00 pm. You do not need an appointment for the interview!  °[facility] [address], [place], [place] 336-631-2330   °[facility]  336-342-8273   °[facility]  336-703-3100   °[facility]  336-570-6415   ° °Behavioral Health Resources in the Community: °Intensive Outpatient Programs °Organization         Address  Phone  Notes  °[facility] [address], [place], [place] 336-878-6098   °[facility] [address], [place], [place] 336-832-9800   °[facility] [address], [place], [place] ° 336-882-2125   °[facility] [address],  °[place], [place] 1-800-853-5163 or 336-641-4981   °Substance Abuse Resources °Organization         Address  Phone  Notes  °[facility]  336-882-2125   °[facility]  336-784-9470   °[facility]  336-285-9073   °[facility]  336-845-3988   °Residential & Outpatient Substance Abuse Program  1-800-659-3381   °Psychological Services °Organization         Address  Phone  Notes  °[facility]  336- 832-9600   °[facility]  336- 378-7881   °[facility] [address], [place] 1-800-853-5163 or 336-641-4981   ° °Mobile Crisis Teams °Organization         Address  Phone  Notes  °[facility], Mobile Crisis Care Unit  1-877-626-1772   °Assertive °[facility] ° [address]  [place], [place] 336-834-9664   °Sharon DeEsch [address] °[place] 336-554-5454   ° °Self-Help/Support Groups °Organization         Address  Phone             Notes  °[facility] - variety of support groups  336- 373-1402 Call for more information  °Narcotics Anonymous (NA), [facility] [address], °[place]  2 meetings at this location  ° °  Residential Treatment Programs °Organization         Address  Phone  Notes  °[facility] [address],    °[place]  1-866-801-8205   °[facility] ° [address], [place], [place] 704-293-8524   °[facility] [address], [place] 336-845-3988 Admissions: 8am-3pm M-F  °[facility] [address]    °[place], [place] 336-841-1104   °[facility] [address], [place], [place] 336-379-7146   °[facility] [address]  °[place], [place] 336-285-9073   °[facility] - Intensive Outpatient [address], [place], [place] 336-852-3033   °[facility] [address]  °[place], [place] 1-877-615-2722 or 336-784-9470   °[facility] [address] [place], [place] 336-227-7417 Accepts Medicaid  °[facility] [address]  ° [place] 1-800-659-3381 Substance Abuse/Addiction Treatment  ° °[place] Behavioral Health Resources °Organization         Address  Phone  Notes  °[facility]  (888) 581-9988   °Julie Brannon, PhD [address] [place], [place]   (336) 349-5553 or (336) 951-0000   °[facility]   [address] °[place], [place] (336) 349-4454   °[facility] [address], [place], [place] (336) 342-8316 Insurance/Medicaid/sponsorship through [facility]  °[facility] [address]                                    [place], [place] (336) 342-8316 Therapy/tele-psych/case    °[facility] [address]  ° [place], [place] (336) 349-2233    °Dr. Arfeen  (336) 349-4544   °[facility] [place]  [employer] [place] [facility] 1) [address], [place] °2) [address], [place] °3)  [address], [place] (336) 349-3220 °(336) 342-7768 ° °(336) 342-8140   °[place] [facility] (336) 342-1394 or (336) 342-3537 (After Hours)    ° ° °

## 2015-06-01 NOTE — Progress Notes (Signed)
LCSW met with patient at the bedside who was very tearful of recent loss of sister. She denies any SI/HI/AVH. Reports she came in due to dizziness and medical problems. Patient has been cleared from psych and asked to follow up in the outpatient. She is given [facility]'s information to make an appointment.  LCSW discussed recent death of patient's sister who was 21 years old and had a massive heart attack. Patient was in the home and witnessed the event.  Reports she had some hear conditions and still actively grieving. She reports she lives with her mother, son 5444 months old) and brother.  Reports she will return to home.  Reports mother and other family are very supportive.  In discussion patient reports stressors are finding a job. Reports she did not complete high school and currently unable to find a job. She is on medicaid.  LCSW gave information about [facility] as she meets criteria.  LCSW called [facility] office and left message.  Will follow up on patient behalf.  Patient also given resources for Palliative Care and Hospice.  Free groups, counseling and information in which she was accepting.    Patient denies all other needs. LCSW discussed outpatient [place] resources and reviewed with patient.  Nothing current at this time for patient, but she is aware of needs and will follow up if problems continue. RN completed [employer], [employer].  Patient to contact family for transportation.   Lane Hacker, MSW Clinical Social Work: Emergency Room [phone]

## 2015-06-01 NOTE — ED Notes (Signed)
Patient making phone call.

## 2015-06-01 NOTE — BH Assessment (Signed)
Reviewed ED notes prior to initiating assessment. Per ED notes pt presenting with several physical complaints and confusion. 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 Today pt is requesting counseling to address grief related to loss of sister who passed away recently.   Requested cart be placed with pt for assessment.    Assessment to commence shortly.    Ruth Daniels, [facility] Triage Specialist 06/01/2015 1:47 AM

## 2015-06-01 NOTE — ED Provider Notes (Addendum)
Seen by neurology. EEG without abnormality. Outpatient neurology follow up. Likely grief reaction. Dc home with outpatient resources  ECG interpretation   Date: 06/01/2015  Rate: 64  Rhythm: normal sinus rhythm  QRS Axis: normal  Intervals: normal  ST/T Wave abnormalities: normal  Conduction Disutrbances: none  Narrative Interpretation:   Old EKG Reviewed: No significant changes noted     Jola Schmidt, MD 06/01/15 Nina, MD 06/01/15 1204

## 2015-06-01 NOTE — ED Notes (Signed)
Patient transported to CT 
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
## 2015-06-01 NOTE — ED Notes (Signed)
Dr. Nicole Kindred- neurology at bedside.

## 2015-06-01 NOTE — ED Notes (Signed)
Pt able to ambulate to restroom with steady gait. Belongings returned to patient.

## 2015-06-01 NOTE — Progress Notes (Signed)
EEG completed, results pending. 

## 2015-06-01 NOTE — Consult Note (Signed)
Admission H&P    Chief Complaint: Transient confusion.  HPI: Ruth Daniels is an 19 y.o. female who experienced an episode of confusion with inability to recognize family members as well as unable to remember her name. Patient indicated she felt dizzy and weak during this spell. Family members said that she was tremulous. Episode reportedly lasted for about 1 hour. Patient was aware of her surroundings and good understanding what was being said to her, but could not think of the correct responses. There was no change in speech. No focal weakness was noticed nor numbness. Patient has been under stress over the past 10 days following the sudden loss of her sister who was 16 years old. She was seen in the ED on 05/23/2015 for an anaphylactic reaction following consumption of mushrooms. She been taking Benadryl and 40 mg of prednisone daily since leaving the emergency room. She also has been sleep deprived. No focal motor seizure activity was described. 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 There were no acute intracranial findings.  Past Medical History  Diagnosis Date  . Allergy     Penicillin  . Eczema   . Chlamydia trachomatis infection in pregnancy   . GBS (group B streptococcus) UTI complicating pregnancy 7/49/4496  . Endometriosis   . PID (acute pelvic inflammatory disease)     Past Surgical History  Procedure Laterality Date  . Foot surgery      Family History  Problem Relation Age of Onset  . Depression Mother         Diabetes mellitus             Sister       MI                Sister  Social History:  reports that she has quit smoking. Her smoking use included Cigarettes. She smoked 0.25 packs per day. She has never used smokeless tobacco. She reports that she does not drink alcohol or use illicit drugs. urine drug screen was positive for THC, however.  Allergies:   Allergies  Allergen Reactions  . Bee Venom Anaphylaxis  . Mushroom Extract Complex Other (See Comments)    unknown  . Latex Rash  . Penicillins Rash and Other (See Comments)    Fever     Medications: Patient's preadmission medications were reviewed by me.  ROS: History obtained from the patient  General ROS: negative for - chills, fatigue, fever, night sweats, weight gain or weight loss Psychological ROS: As noted in present illness  Ophthalmic ROS: negative for - blurry vision, double vision, eye pain or loss of vision ENT ROS: negative for - epistaxis, nasal discharge, oral lesions, sore throat, tinnitus or vertigo Allergy and Immunology ROS: As noted in present illness Hematological and Lymphatic ROS: negative for - bleeding problems, bruising or swollen lymph nodes Endocrine ROS: negative for - galactorrhea, hair pattern changes, polydipsia/polyuria or temperature intolerance Respiratory ROS: negative for - cough, hemoptysis, shortness of breath or wheezing Cardiovascular ROS: negative for - chest pain, dyspnea on exertion, edema or irregular heartbeat Gastrointestinal ROS: negative for - abdominal pain, diarrhea, hematemesis, nausea/vomiting or stool incontinence Genito-Urinary ROS: negative for - dysuria, hematuria, incontinence or urinary frequency/urgency Musculoskeletal ROS: negative for - joint swelling or muscular weakness Neurological ROS: as noted in HPI Dermatological ROS: negative for rash and skin lesion changes  Physical Examination: Blood pressure  102/57, pulse 56, temperature 98.2 F (36.8 C), temperature source Oral, resp. rate 18, height $RemoveBe'5\' 1"'NMXatGSCL$  (1.549 m), weight 56.7 kg (125 lb), last menstrual period 05/30/2015, SpO2 100 %, not currently breastfeeding.  HEENT-  Normocephalic, no lesions, without obvious abnormality.  Normal external eye and conjunctiva.  Normal TM's bilaterally.  Normal auditory canals and external ears. Normal external nose, mucus membranes  and septum.  Normal pharynx. Neck supple with no masses, nodes, nodules or enlargement. Cardiovascular - regular rate and rhythm, S1, S2 normal, no murmur, click, rub or gallop Lungs - chest clear, no wheezing, rales, normal symmetric air entry Abdomen - soft, non-tender; bowel sounds normal; no masses,  no organomegaly Extremities - no joint deformities, effusion, or inflammation, no edema and no skin discoloration  Neurologic Examination: Mental Status: Alert, oriented, no acute distress.  Speech fluent without evidence of aphasia. Able to follow commands without difficulty. Cranial Nerves: II-Visual fields were normal. III/IV/VI-Pupils were equal and reacted normally to light. Extraocular movements were full and conjugate.    V/VII-no facial numbness and no facial weakness. VIII-normal. X-normal speech and symmetrical palatal movement. XI: trapezius strength/neck flexion strength normal bilaterally XII-midline tongue extension with normal strength. Motor: 5/5 bilaterally with normal tone and bulk Sensory: Normal throughout. Deep Tendon Reflexes: 2+ and symmetric. Plantars: Flexor bilaterally Cerebellar: Normal finger-to-nose testing. Carotid auscultation: Normal  EEG, routine study performed today, was normal.  Results for orders placed or performed during the hospital encounter of 05/31/15 (from the past 48 hour(s))  CBC with Differential/Platelet     Status: Abnormal   Collection Time: 05/31/15 10:59 PM  Result Value Ref Range   WBC 8.3 4.0 - 10.5 K/uL   RBC 4.06 3.87 - 5.11 MIL/uL   Hemoglobin 12.4 12.0 - 15.0 g/dL   HCT 35.1 (L) 36.0 - 46.0 %   MCV 86.5 78.0 - 100.0 fL   MCH 30.5 26.0 - 34.0 pg   MCHC 35.3 30.0 - 36.0 g/dL   RDW 12.0 11.5 - 15.5 %   Platelets 268 150 - 400 K/uL   Neutrophils Relative % 40 (L) 43 - 77 %   Neutro Abs 3.3 1.7 - 7.7 K/uL   Lymphocytes Relative 52 (H) 12 - 46 %   Lymphs Abs 4.2 (H) 0.7 - 4.0 K/uL   Monocytes Relative 8 3 - 12 %    Monocytes Absolute 0.7 0.1 - 1.0 K/uL   Eosinophils Relative 0 0 - 5 %   Eosinophils Absolute 0.0 0.0 - 0.7 K/uL   Basophils Relative 0 0 - 1 %   Basophils Absolute 0.0 0.0 - 0.1 K/uL  Comprehensive metabolic panel     Status: Abnormal   Collection Time: 05/31/15 10:59 PM  Result Value Ref Range   Sodium 137 135 - 145 mmol/L   Potassium 3.4 (L) 3.5 - 5.1 mmol/L   Chloride 103 101 - 111 mmol/L   CO2 23 22 - 32 mmol/L   Glucose, Bld 84 65 - 99 mg/dL   BUN 10 6 - 20 mg/dL   Creatinine, Ser 0.60 0.44 - 1.00 mg/dL   Calcium 9.1 8.9 - 10.3 mg/dL   Total Protein 6.6 6.5 - 8.1 g/dL   Albumin 3.9 3.5 - 5.0 g/dL   AST 12 (L) 15 - 41 U/L   ALT 11 (L) 14 - 54 U/L   Alkaline Phosphatase 41 38 - 126 U/L   Total Bilirubin 0.4 0.3 - 1.2 mg/dL   GFR calc non Af Amer >60 >60 mL/min  GFR calc Af Amer >60 >60 mL/min    Comment: (NOTE) The eGFR has been calculated using the CKD EPI equation. This calculation has not been validated in all clinical situations. eGFR's persistently <60 mL/min signify possible Chronic Kidney Disease.    Anion gap 11 5 - 15  Urinalysis, Routine w reflex microscopic (not at [facility])     Status: Abnormal   Collection Time: 05/31/15 11:05 PM  Result Value Ref Range   Color, Urine YELLOW YELLOW   APPearance CLOUDY (A) CLEAR   Specific Gravity, Urine 1.024 1.005 - 1.030   pH 7.0 5.0 - 8.0   Glucose, UA NEGATIVE NEGATIVE mg/dL   Hgb urine dipstick NEGATIVE NEGATIVE   Bilirubin Urine NEGATIVE NEGATIVE   Ketones, ur NEGATIVE NEGATIVE mg/dL   Protein, ur NEGATIVE NEGATIVE mg/dL   Urobilinogen, UA 0.2 0.0 - 1.0 mg/dL   Nitrite NEGATIVE NEGATIVE   Leukocytes, UA NEGATIVE NEGATIVE    Comment: MICROSCOPIC NOT DONE ON URINES WITH NEGATIVE PROTEIN, BLOOD, LEUKOCYTES, NITRITE, OR GLUCOSE <1000 mg/dL.  Urine rapid drug screen (hosp performed)not at [facility]     Status: Abnormal   Collection Time: 05/31/15 11:05 PM  Result Value Ref Range   Opiates NONE DETECTED NONE DETECTED    Cocaine NONE DETECTED NONE DETECTED   Benzodiazepines NONE DETECTED NONE DETECTED   Amphetamines NONE DETECTED NONE DETECTED   Tetrahydrocannabinol POSITIVE (A) NONE DETECTED   Barbiturates NONE DETECTED NONE DETECTED    Comment:        DRUG SCREEN FOR MEDICAL PURPOSES ONLY.  IF CONFIRMATION IS NEEDED FOR ANY PURPOSE, NOTIFY LAB WITHIN 5 DAYS.        LOWEST DETECTABLE LIMITS FOR URINE DRUG SCREEN Drug Class       Cutoff (ng/mL) Amphetamine      1000 Barbiturate      200 Benzodiazepine   093 Tricyclics       267 Opiates          300 Cocaine          300 THC              50   Pregnancy, urine     Status: None   Collection Time: 05/31/15 11:05 PM  Result Value Ref Range   Preg Test, Ur NEGATIVE NEGATIVE    Comment:        THE SENSITIVITY OF THIS METHODOLOGY IS >20 mIU/mL.    Ct Head Wo Contrast  06/01/2015   CLINICAL DATA:  Acute onset of altered mental status. Shaking and burning sensation all about the body. Confusion. Initial encounter.  EXAM: CT HEAD WITHOUT CONTRAST  TECHNIQUE: Contiguous axial images were obtained from the base of the skull through the vertex without intravenous contrast.  COMPARISON:  None.  FINDINGS: There is an unusual partially calcified mass noted at the inferior aspect of the right frontal lobe, measuring approximately 2.0 cm, without significant associated mass effect or vasogenic edema. The differential includes a vascular malformation or meningioma, though a primary glial tumor such as an oligodendroglioma could have a similar appearance. The scalloping of the underlying calvarium is unusual for a meningioma, and the location is unusual for a vascular malformation.  There is no evidence of acute hemorrhage. There is no evidence of acute infarct. The posterior fossa, including the cerebellum, brainstem and fourth ventricle, is within normal limits. The third and lateral ventricles, and basal ganglia are unremarkable in appearance. No midline shift is seen.   There is no evidence of fracture;  visualized osseous structures are unremarkable in appearance. The visualized portions of the orbits are within normal limits. The paranasal sinuses and mastoid air cells are well-aerated. No significant soft tissue abnormalities are seen.  IMPRESSION: Unusual partially calcified mass at the inferior aspect of the right frontal lobe, measuring 2.0 cm, without significant associated mass effect or vasogenic edema. The differential includes a vascular malformation or meningioma, though a primary glial tumor such as an oligodendroglioma could have a similar appearance. The scalloping of the underlying calvarium is unusual for meningioma, and the location is unusual for vascular malformation. MRI of the brain with contrast is recommended for further evaluation.  These results were called by telephone at the time of interpretation on 06/01/2015 at 1:51 am to the ER team, who verbally acknowledged these results.   Electronically Signed   By: Garald Balding M.D.   On: 06/01/2015 01:53   Mr Jeri Cos WU Contrast  06/01/2015   CLINICAL DATA:  Recent anaphylactic reaction to mushrooms. Weakness and presyncope, confusion. Recent death of family member with grief. On Benadryl.  EXAM: MRI HEAD WITHOUT AND WITH CONTRAST  TECHNIQUE: Multiplanar, multiecho pulse sequences of the brain and surrounding structures were obtained without and with intravenous contrast.  CONTRAST:  41m MULTIHANCE GADOBENATE DIMEGLUMINE 529 MG/ML IV SOLN  COMPARISON:  CT head June 01, 2015  FINDINGS: The ventricles and sulci are normal for patient's age. No reduced diffusion to suggest acute ischemia, hypercellular tumor. No susceptibility artifact to suggest hemorrhage. RIGHT frontal lobe 17 x 18 x 16 mm mass with speckled enhancement, a single of vein coursing centrally consistent with associated developmental venous anomaly. Central susceptibility artifact and faint hypointense T2 hemosiderin ring. No vasogenic edema or  significant mass effect. No dural tail. No additional areas of suspicious enhancement.  No abnormal extra-axial fluid collections. No extra-axial masses nor leptomeningeal enhancement. Normal major intracranial vascular flow voids seen at the skull base.  Ocular globes and orbital contents are nonsuspicious though not tailored for evaluation. Remote LEFT medial orbital blowout fracture. No abnormal sellar expansion. Visualized paranasal sinuses and mastoid air cells are well-aerated. No suspicious calvarial bone marrow signal. No abnormal sellar expansion. Craniocervical junction maintained.  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  No acute intracranial process.   Electronically Signed   By: CElon AlasM.D.   On: 06/01/2015 04:51    Assessment/Plan 19year old lady presenting with transient alteration in mental status with confusion. Etiology is unclear. Exam was nonfocal. 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 EEG was normal. I suspect patient's presenting complaints are likely psychophysiologic in etiology. New-onset seizure disorder is unlikely. Findings on CT and MRI are probably incidental.  No further neurological intervention is indicated at this point. I agree with further evaluation with psychiatric consultation.  C.R. SNicole Kindred MD [facility] [phone] 06/01/2015, 7:58 AM

## 2015-06-01 NOTE — ED Notes (Signed)
Patient given burgundy scrubs, explained that belongings will either be locked up or need to be sent home, advised pts family member it would be best for them to take valuables and belongings. Pt and family acknowledged understanding of process for pt being psychologically evaluated.

## 2015-06-01 NOTE — ED Notes (Signed)
Security contacted to wand patient

## 2015-06-23 ENCOUNTER — Encounter (HOSPITAL_COMMUNITY): Payer: Self-pay | Admitting: Emergency Medicine

## 2015-06-23 ENCOUNTER — Emergency Department (HOSPITAL_COMMUNITY)
Admission: EM | Admit: 2015-06-23 | Discharge: 2015-06-23 | Disposition: A | Discharge status: Home / Self Care | Payer: Medicaid Other | Attending: Emergency Medicine | Admitting: Emergency Medicine

## 2015-06-23 DIAGNOSIS — R102 Pelvic and perineal pain: Secondary | ICD-10-CM | POA: Insufficient documentation

## 2015-06-23 DIAGNOSIS — Z3202 Encounter for pregnancy test, result negative: Secondary | ICD-10-CM | POA: Insufficient documentation

## 2015-06-23 DIAGNOSIS — Z91040 Latex allergy status: Secondary | ICD-10-CM | POA: Insufficient documentation

## 2015-06-23 DIAGNOSIS — R1032 Left lower quadrant pain: Secondary | ICD-10-CM | POA: Insufficient documentation

## 2015-06-23 DIAGNOSIS — Z872 Personal history of diseases of the skin and subcutaneous tissue: Secondary | ICD-10-CM | POA: Insufficient documentation

## 2015-06-23 DIAGNOSIS — Z8619 Personal history of other infectious and parasitic diseases: Secondary | ICD-10-CM | POA: Insufficient documentation

## 2015-06-23 DIAGNOSIS — G8929 Other chronic pain: Secondary | ICD-10-CM

## 2015-06-23 DIAGNOSIS — Z87891 Personal history of nicotine dependence: Secondary | ICD-10-CM | POA: Diagnosis not present

## 2015-06-23 DIAGNOSIS — Z880 Allergy status to penicillin: Secondary | ICD-10-CM | POA: Insufficient documentation

## 2015-06-23 DIAGNOSIS — R1012 Left upper quadrant pain: Secondary | ICD-10-CM | POA: Diagnosis present

## 2015-06-23 DIAGNOSIS — Z79899 Other long term (current) drug therapy: Secondary | ICD-10-CM | POA: Diagnosis not present

## 2015-06-23 DIAGNOSIS — R112 Nausea with vomiting, unspecified: Secondary | ICD-10-CM | POA: Diagnosis not present

## 2015-06-23 LAB — URINALYSIS, ROUTINE W REFLEX MICROSCOPIC
BILIRUBIN URINE: NEGATIVE
GLUCOSE, UA: NEGATIVE mg/dL
HGB URINE DIPSTICK: NEGATIVE
Ketones, ur: NEGATIVE mg/dL
Leukocytes, UA: NEGATIVE
Nitrite: NEGATIVE
PH: 7.5 (ref 5.0–8.0)
Protein, ur: NEGATIVE mg/dL
SPECIFIC GRAVITY, URINE: 1.03 (ref 1.005–1.030)
Urobilinogen, UA: 1 mg/dL (ref 0.0–1.0)

## 2015-06-23 LAB — WET PREP, GENITAL: Yeast Wet Prep HPF POC: NONE SEEN

## 2015-06-23 LAB — POC URINE PREG, ED: Preg Test, Ur: NEGATIVE

## 2015-06-23 NOTE — ED Notes (Signed)
Pt sts lower abd pain; pt sts LMP was in May and unsure if she could be pregnant

## 2015-06-23 NOTE — Discharge Instructions (Signed)
Pelvic Pain Female pelvic pain can be caused by many different things and start from a variety of places. Pelvic pain refers to pain that is located in the lower half of the abdomen and between your hips. The pain may occur over a short period of time (acute) or may be reoccurring (chronic). The cause of pelvic pain may be related to disorders affecting the female reproductive organs (gynecologic), but it may also be related to the bladder, kidney stones, an intestinal complication, or muscle or skeletal problems. Getting help right away for pelvic pain is important, especially if there has been severe, sharp, or a sudden onset of unusual pain. It is also important to get help right away because some types of pelvic pain can be life threatening.  CAUSES  Below are only some of the causes of pelvic pain. The causes of pelvic pain can be in one of several categories.   Gynecologic.  Pelvic inflammatory disease.  Sexually transmitted infection.  Ovarian cyst or a twisted ovarian ligament (ovarian torsion).  Uterine lining that grows outside the uterus (endometriosis).  Fibroids, cysts, or tumors.  Ovulation.  Pregnancy.  Pregnancy that occurs outside the uterus (ectopic pregnancy).  Miscarriage.  Labor.  Abruption of the placenta or ruptured uterus.  Infection.  Uterine infection (endometritis).  Bladder infection.  Diverticulitis.  Miscarriage related to a uterine infection (septic abortion).  Bladder.  Inflammation of the bladder (cystitis).  Kidney stone(s).  Gastrointestinal.  Constipation.  Diverticulitis.  Neurologic.  Trauma.  Feeling pelvic pain because of mental or emotional causes (psychosomatic).  Cancers of the bowel or pelvis. EVALUATION  Your caregiver will want to take a careful history of your concerns. This includes recent changes in your health, a careful gynecologic history of your periods (menses), and a sexual history. Obtaining your family  history and medical history is also important. Your caregiver may suggest a pelvic exam. A pelvic exam will help identify the location and severity of the pain. It also helps in the evaluation of which organ system may be involved. In order to identify the cause of the pelvic pain and be properly treated, your caregiver may order tests. These tests may include:   A pregnancy test.  Pelvic ultrasonography.  An X-ray exam of the abdomen.  A urinalysis or evaluation of vaginal discharge.  Blood tests. HOME CARE INSTRUCTIONS   Only take over-the-counter or prescription medicines for pain, discomfort, or fever as directed by your caregiver.   Rest as directed by your caregiver.   Eat a balanced diet.   Drink enough fluids to make your urine clear or pale yellow, or as directed.   Avoid sexual intercourse if it causes pain.   Apply warm or cold compresses to the lower abdomen depending on which one helps the pain.   Avoid stressful situations.   Keep a journal of your pelvic pain. Write down when it started, where the pain is located, and if there are things that seem to be associated with the pain, such as food or your menstrual cycle.  Follow up with your caregiver as directed.  SEEK MEDICAL CARE IF:  Your medicine does not help your pain.  You have abnormal vaginal discharge. SEEK IMMEDIATE MEDICAL CARE IF:   You have heavy bleeding from the vagina.   Your pelvic pain increases.   You feel light-headed or faint.   You have chills.   You have pain with urination or blood in your urine.   You have uncontrolled diarrhea   or vomiting.   You have a fever or persistent symptoms for more than 3 days.  You have a fever and your symptoms suddenly get worse.   You are being physically or sexually abused.  MAKE SURE YOU:  Understand these instructions.  Will watch your condition.  Will get help if you are not doing well or get worse. Document Released:  11/13/2004 Document Revised: 05/03/2014 Document Reviewed: 04/07/2012 ExitCare Patient Information 2015 [employer]. This information is not intended to replace advice given to you by your health care provider. Make sure you discuss any questions you have with your health care provider.  

## 2015-06-23 NOTE — ED Provider Notes (Signed)
CSN: [id number]     Arrival date & time 06/23/15  1318 History   First MD Initiated Contact with Patient 06/23/15 1700     Chief Complaint  Patient presents with  . Abdominal Pain     (Consider location/radiation/quality/duration/timing/severity/associated sxs/prior Treatment) Patient is a 19 y.o. female presenting with abdominal pain.  Abdominal Pain Pain location:  LUQ Pain quality: aching, sharp and stabbing   Pain radiates to:  Does not radiate Pain severity:  Moderate Onset quality:  Gradual Duration: 1 year. Timing:  Constant Progression:  Waxing and waning Chronicity:  Chronic Context: not recent illness   Context comment:  Spotting from 6/20-23, LMP 6/8 lasting to 6/16 Relieved by:  Nothing Worsened by:  Nothing tried Ineffective treatments:  None tried Associated symptoms: nausea and vomiting (3 days, 1-2x/day)   Associated symptoms: no constipation, no diarrhea, no fever and no vaginal discharge     Past Medical History  Diagnosis Date  . Allergy     Penicillin  . Eczema   . Chlamydia trachomatis infection in pregnancy   . GBS (group B streptococcus) UTI complicating pregnancy 09/23/2682  . Endometriosis   . PID (acute pelvic inflammatory disease)    Past Surgical History  Procedure Laterality Date  . Foot surgery     Family History  Problem Relation Age of Onset  . Depression Mother    History  Substance Use Topics  . Smoking status: Former Smoker -- 0.25 packs/day    Types: Cigarettes  . Smokeless tobacco: Never Used  . Alcohol Use: No   OB History    Gravida Para Term Preterm AB TAB SAB Ectopic Multiple Living   1 1 1       1      Review of Systems  Constitutional: Negative for fever.  Gastrointestinal: Positive for nausea, vomiting (3 days, 1-2x/day) and abdominal pain. Negative for diarrhea and constipation.  Genitourinary: Negative for vaginal discharge.  All other systems reviewed and are negative.     Allergies  Bee venom; Mushroom  extract complex; Latex; and Penicillins  Home Medications   Prior to Admission medications   Medication Sig Start Date End Date Taking? Authorizing Provider  diphenhydrAMINE (BENADRYL) 25 MG tablet Take 1 tablet (25 mg total) by mouth every 6 (six) hours. 05/23/15   Carlisle Cater, PA-C  EPINEPHrine 0.3 mg/0.3 mL IJ SOAJ injection Inject 0.3 mLs (0.3 mg total) into the muscle once. 05/23/15   Carlisle Cater, PA-C  famotidine (PEPCID) 20 MG tablet Take 1 tablet (20 mg total) by mouth 2 (two) times daily. 05/23/15   Carlisle Cater, PA-C  predniSONE (DELTASONE) 20 MG tablet Take 2 tablets (40 mg total) by mouth daily. 05/23/15   Carlisle Cater, PA-C   BP 111/59 mmHg  Pulse 63  Temp(Src) 98.3 F (36.8 C) (Oral)  Resp 16  SpO2 100%  LMP 05/30/2015 (Exact Date) Physical Exam  Constitutional: She is oriented to person, place, and time. She appears well-developed and well-nourished. No distress.  HENT:  Head: Normocephalic.  Eyes: Conjunctivae are normal.  Neck: Neck supple. No tracheal deviation present.  Cardiovascular: Normal rate and regular rhythm.   Pulmonary/Chest: Effort normal. No respiratory distress.  Abdominal: Soft. She exhibits no distension. There is tenderness in the left lower quadrant. There is no rigidity, no guarding, no tenderness at McBurney's point and negative Murphy's sign.    Genitourinary: Uterus normal. Cervix exhibits motion tenderness (with small blood from os). Right adnexum displays no mass and no fullness. Left adnexum  displays no mass and no fullness. Vaginal discharge (scant white) found.  Neurological: She is alert and oriented to person, place, and time.  Skin: Skin is warm and dry.  Psychiatric: She has a normal mood and affect.    ED Course  Procedures (including critical care time) Labs Review Labs Reviewed  WET PREP, GENITAL  URINALYSIS, ROUTINE W REFLEX MICROSCOPIC (NOT AT [facility])  POC URINE PREG, ED  GC/CHLAMYDIA PROBE AMP ([place]) NOT AT [facility]     Imaging Review No results found.   EKG Interpretation None      MDM   Final diagnoses:  Chronic pelvic pain in female    19 year old female presents with chronic left lower quadrant pain that has been ongoing for over a year. It has been waxing and waning with intensity related to her menstrual cycle. She has seen a gynecologist previously a bit noted she had possible endometriosis. She has been ruled out for PID on multiple swabs and has been treated previously for PID without improvement in symptoms. I suspect based on the clinical history that the patient does have endometriosis with typical features of pain with defecation, waxing and waning intensity, and location. I explained to the patient that laparoscopic exploration would be diagnostic and therapeutic and that no further interventions besides supportive care and NSAIDs will be recommended from the emergency department. Will send swabs for GC chlamydia and a wet prep for screening but at this time I do not feel empiric treatment is warranted.    Leo Grosser, MD 96/29/52 [id number]  Delora Fuel, MD 24/40/10 2725

## 2015-06-23 NOTE — ED Notes (Signed)
Pt c/o abd pain radiates to left flank area-- period was abnormal this month -- spotted on 6/20, stopped, has not restarted. Periods have been regular in past.

## 2015-06-24 LAB — GC/CHLAMYDIA PROBE AMP (~~LOC~~) NOT AT ARMC
Chlamydia: POSITIVE — AB
Neisseria Gonorrhea: NEGATIVE

## 2015-06-25 ENCOUNTER — Encounter (HOSPITAL_COMMUNITY): Payer: Self-pay | Admitting: Emergency Medicine

## 2015-06-25 ENCOUNTER — Emergency Department (HOSPITAL_COMMUNITY)
Admission: EM | Admit: 2015-06-25 | Discharge: 2015-06-25 | Disposition: A | Discharge status: Home / Self Care | Payer: Medicaid Other | Attending: Emergency Medicine | Admitting: Emergency Medicine

## 2015-06-25 DIAGNOSIS — Z7952 Long term (current) use of systemic steroids: Secondary | ICD-10-CM | POA: Insufficient documentation

## 2015-06-25 DIAGNOSIS — R062 Wheezing: Secondary | ICD-10-CM | POA: Diagnosis not present

## 2015-06-25 DIAGNOSIS — R21 Rash and other nonspecific skin eruption: Secondary | ICD-10-CM | POA: Insufficient documentation

## 2015-06-25 DIAGNOSIS — L299 Pruritus, unspecified: Secondary | ICD-10-CM | POA: Diagnosis present

## 2015-06-25 DIAGNOSIS — Z79899 Other long term (current) drug therapy: Secondary | ICD-10-CM | POA: Insufficient documentation

## 2015-06-25 DIAGNOSIS — Z880 Allergy status to penicillin: Secondary | ICD-10-CM | POA: Diagnosis not present

## 2015-06-25 DIAGNOSIS — R1310 Dysphagia, unspecified: Secondary | ICD-10-CM | POA: Diagnosis not present

## 2015-06-25 DIAGNOSIS — Z87891 Personal history of nicotine dependence: Secondary | ICD-10-CM | POA: Diagnosis not present

## 2015-06-25 DIAGNOSIS — Z91040 Latex allergy status: Secondary | ICD-10-CM | POA: Diagnosis not present

## 2015-06-25 DIAGNOSIS — Z8742 Personal history of other diseases of the female genital tract: Secondary | ICD-10-CM | POA: Diagnosis not present

## 2015-06-25 DIAGNOSIS — T7840XA Allergy, unspecified, initial encounter: Secondary | ICD-10-CM | POA: Diagnosis not present

## 2015-06-25 MED ORDER — DIPHENHYDRAMINE HCL 25 MG PO TABS: 25.0000 mg | ORAL_TABLET | Freq: Four times a day (QID) | ORAL | Status: DC | PRN

## 2015-06-25 MED ORDER — FAMOTIDINE IN NACL 20-0.9 MG/50ML-% IV SOLN
20.0000 mg | Freq: Once | INTRAVENOUS | Status: AC
  Administered 2015-06-25: 20 mg via INTRAVENOUS
  Filled 2015-06-25: qty 50

## 2015-06-25 MED ORDER — EPINEPHRINE 0.3 MG/0.3ML IJ SOAJ: 0.3000 mg | Freq: Once | INTRAMUSCULAR | Status: DC

## 2015-06-25 MED ORDER — METHYLPREDNISOLONE SODIUM SUCC 125 MG IJ SOLR
125.0000 mg | Freq: Once | INTRAMUSCULAR | Status: AC
  Administered 2015-06-25: 125 mg via INTRAVENOUS
  Filled 2015-06-25: qty 2

## 2015-06-25 MED ORDER — SODIUM CHLORIDE 0.9 % IV BOLUS (SEPSIS)
1000.0000 mL | Freq: Once | INTRAVENOUS | Status: AC
  Administered 2015-06-25: 1000 mL via INTRAVENOUS

## 2015-06-25 NOTE — ED Notes (Signed)
Pt was outside- started feeling allergic reaction coming on. Pt used her epi pen. EMS gave 50mg  zantac, 5mg  albuterol treatment, benedryl 50mg . Pt having some itching on arms. Denies N/V. Pt has hx of food allergies/bee venom allergy. BP 126/78, HR 80.

## 2015-06-25 NOTE — Discharge Instructions (Signed)
Please follow up with your primary care physician in 1-2 days. If you do not have one please call the [facility] number listed above. Please read all discharge instructions and return precautions.  ° ° °Anaphylactic Reaction °An anaphylactic reaction is a sudden, severe allergic reaction that involves the whole body. It can be life threatening. A hospital stay is often required. People with asthma, eczema, or hay fever are slightly more likely to have an anaphylactic reaction. °CAUSES  °An anaphylactic reaction may be caused by anything to which you are allergic. After being exposed to the allergic substance, your immune system becomes sensitized to it. When you are exposed to that allergic substance again, an allergic reaction can occur. Common causes of an anaphylactic reaction include: °· Medicines. °· Foods, especially peanuts, wheat, shellfish, milk, and eggs. °· Insect bites or stings. °· Blood products. °· Chemicals, such as dyes, latex, and contrast material used for imaging tests. °SYMPTOMS  °When an allergic reaction occurs, the body releases histamine and other substances. These substances cause symptoms such as tightening of the airway. Symptoms often develop within seconds or minutes of exposure. Symptoms may include: °· Skin rash or hives. °· Itching. °· Chest tightness. °· Swelling of the eyes, tongue, or lips. °· Trouble breathing or swallowing. °· Lightheadedness or fainting. °· Anxiety or confusion. °· Stomach pains, vomiting, or diarrhea. °· Nasal congestion. °· A fast or irregular heartbeat (palpitations). °DIAGNOSIS  °Diagnosis is based on your history of recent exposure to allergic substances, your symptoms, and a physical exam. Your caregiver may also perform blood or urine tests to confirm the diagnosis. °TREATMENT  °Epinephrine medicine is the main treatment for an anaphylactic reaction. Other medicines that may be used for treatment include antihistamines, steroids, and  albuterol. In severe cases, fluids and medicine to support blood pressure may be given through an intravenous line (IV). Even if you improve after treatment, you need to be observed to make sure your condition does not get worse. This may require a stay in the hospital. °HOME CARE INSTRUCTIONS  °· Wear a medical alert bracelet or necklace stating your allergy. °· You and your family must learn how to use an anaphylaxis kit or give an epinephrine injection to temporarily treat an emergency allergic reaction. Always carry your epinephrine injection or anaphylaxis kit with you. This can be lifesaving if you have a severe reaction. °· Do not drive or perform tasks after treatment until the medicines used to treat your reaction have worn off, or until your caregiver says it is okay. °· If you have hives or a rash: °¨ Take medicines as directed by your caregiver. °¨ You may use an over-the-counter antihistamine (diphenhydramine) as needed. °¨ Apply cold compresses to the skin or take baths in cool water. Avoid hot baths or showers. °SEEK MEDICAL CARE IF:  °· You develop symptoms of an allergic reaction to a new substance. Symptoms may start right away or minutes later. °· You develop a rash, hives, or itching. °· You develop new symptoms. °SEEK IMMEDIATE MEDICAL CARE IF:  °· You have swelling of the mouth, difficulty breathing, or wheezing. °· You have a tight feeling in your chest or throat. °· You develop hives, swelling, or itching all over your body. °· You develop severe vomiting or diarrhea. °· You feel faint or pass out. °This is an emergency. Use your epinephrine injection or anaphylaxis kit as you have been instructed. Call your local emergency services (911 in [place] Even   you improve after the injection, you need to be examined at a hospital emergency department. MAKE SURE YOU:   Understand these instructions.  Will watch your condition.  Will get help right away if you are not doing well or get  worse. Document Released: 12/17/2005 Document Revised: 12/22/2013 Document Reviewed: 03/19/2012 [facility] Patient Information 2015 [place], [place]. This information is not intended to replace advice given to you by your health care provider. Make sure you discuss any questions you have with your health care provider.

## 2015-06-25 NOTE — ED Provider Notes (Signed)
CSN: [id number]     Arrival date & time 06/25/15  1553 History   First MD Initiated Contact with Patient 06/25/15 1555     Chief Complaint  Patient presents with  . Allergic Reaction     (Consider location/radiation/quality/duration/timing/severity/associated sxs/prior Treatment) HPI Comments: Patient is a 19 year old female past medical history significant for allergies, presenting to the emergency department via EMS for allergic reaction. Patient states she she was stung by a bee in the back around 3 PM this afternoon, developed difficulty breathing and rash with associated nausea. She took her EpiPen and called 911. She was given 5 mg of albuterol, 50 mg of Benadryl and 50 mg is intact via EMS. She reports continuing to have some itching on her arms and continued throat itching. She states the symptoms have improved a little.  Patient is a 19 y.o. female presenting with allergic reaction. The history is provided by the patient.  Allergic Reaction Presenting symptoms: difficulty breathing, difficulty swallowing, itching, rash and wheezing   Difficulty breathing:    Onset quality:  Sudden   Progression:  Partially resolved Difficulty swallowing:    Onset quality:  Sudden   Progression:  Partially resolved Itching:    Location:  Arm Rash:    Location:  Arm   Quality: itchiness     Onset quality:  Sudden Severity:  Severe Prior allergic episodes:  Insect allergies Context: insect bite/sting   Relieved by:  Antihistamines, epinephrine and bronchodilators Worsened by:  Nothing tried Ineffective treatments:  None tried   Past Medical History  Diagnosis Date  . Allergy     Penicillin  . Eczema   . Chlamydia trachomatis infection in pregnancy   . GBS (group B streptococcus) UTI complicating pregnancy 4/00/8676  . Endometriosis   . PID (acute pelvic inflammatory disease)    Past Surgical History  Procedure Laterality Date  . Foot surgery     Family History  Problem Relation  Age of Onset  . Depression Mother    History  Substance Use Topics  . Smoking status: Former Smoker -- 0.25 packs/day    Types: Cigarettes  . Smokeless tobacco: Never Used  . Alcohol Use: No   OB History    Gravida Para Term Preterm AB TAB SAB Ectopic Multiple Living   1 1 1       1      Review of Systems  HENT: Positive for trouble swallowing.   Respiratory: Positive for wheezing.   Skin: Positive for itching and rash.  All other systems reviewed and are negative.     Allergies  Bee venom; Latex; Mushroom extract complex; and Penicillins  Home Medications   Prior to Admission medications   Medication Sig Start Date End Date Taking? Authorizing Provider  diphenhydrAMINE (BENADRYL) 25 MG tablet Take 1 tablet (25 mg total) by mouth every 6 (six) hours as needed for itching or allergies. 06/25/15   Amerigo Mcglory, PA-C  EPINEPHrine 0.3 mg/0.3 mL IJ SOAJ injection Inject 0.3 mLs (0.3 mg total) into the muscle once. 06/25/15   Rockey Guarino, PA-C  famotidine (PEPCID) 20 MG tablet Take 1 tablet (20 mg total) by mouth 2 (two) times daily. 05/23/15   Carlisle Cater, PA-C  predniSONE (DELTASONE) 20 MG tablet Take 2 tablets (40 mg total) by mouth daily. 05/23/15   Carlisle Cater, PA-C   BP 118/56 mmHg  Pulse 102  Temp(Src) 98.2 F (36.8 C) (Oral)  Resp 16  SpO2 100%  LMP 05/30/2015 (Exact Date) Physical Exam  Constitutional: She is oriented to person, place, and time. She appears well-developed and well-nourished. No distress.  HENT:  Head: Normocephalic and atraumatic.  Right Ear: External ear normal.  Left Ear: External ear normal.  Nose: Nose normal.  Mouth/Throat: Oropharynx is clear and moist. No oropharyngeal exudate.  No angioedema. No intraoral swelling.  Eyes: Conjunctivae are normal.  Neck: Normal range of motion. Neck supple.  No nuchal rigidity.   Cardiovascular: Normal rate, regular rhythm and normal heart sounds.   Pulmonary/Chest: Effort normal and  breath sounds normal. She has no wheezes.  Abdominal: Soft. There is no tenderness.  Musculoskeletal: Normal range of motion.  Neurological: She is alert and oriented to person, place, and time.  Skin: Skin is warm and dry. No rash noted. She is not diaphoretic.  Psychiatric: She has a normal mood and affect.  Nursing note and vitals reviewed.   ED Course  Procedures (including critical care time) Medications  methylPREDNISolone sodium succinate (SOLU-MEDROL) 125 mg/2 mL injection 125 mg (125 mg Intravenous Given 06/25/15 1613)  sodium chloride 0.9 % bolus 1,000 mL (0 mLs Intravenous Stopped 06/25/15 1818)  famotidine (PEPCID) IVPB 20 mg premix (0 mg Intravenous Stopped 06/25/15 1720)    Labs Review Labs Reviewed - No data to display  Imaging Review No results found.   EKG Interpretation None       7:21 PM Patient asymptomatic. Tolerating PO intake without difficulty.   MDM   Final diagnoses:  Allergic reaction, initial encounter    Filed Vitals:   06/25/15 2016  BP: 118/56  Pulse: 102  Temp: 98.2 F (36.8 C)  Resp: 16   Afebrile, NAD, non-toxic appearing, AAOx4.   Patient monitoring emergency department for 4 hours after EpiPen use (total five hours since EpiPen adminstration). no signs of respiratory distress, vomiting, diarrhea, intraoral swelling or angioedema or rash on initial examination.  Patient re-evaluated prior to dc, is hemodynamically stable, in no respiratory distress, and denies the feeling of throat closing. Pt has been advised to take OTC benadryl & return to the ED if they have a mod-severe allergic rxn (s/s including throat closing, difficulty breathing, swelling of lips face or tongue). Pt is to follow up with their PCP. Pt is agreeable with plan & verbalizes understanding. Patient is stable at time of discharge      Baron Sane, PA-C 06/25/15 2111  Evelina Bucy, MD 06/25/15 2130

## 2015-06-27 ENCOUNTER — Telehealth (HOSPITAL_COMMUNITY): Payer: Self-pay

## 2015-06-27 NOTE — ED Notes (Signed)
Positive for chlamydia- chart sent to [place] office for review.

## 2015-06-29 ENCOUNTER — Telehealth (HOSPITAL_COMMUNITY): Payer: Self-pay

## 2015-06-29 NOTE — Telephone Encounter (Signed)
Chart returned from ED. Reviewed by Dr Winfred Leeds.  Orders given for Zithromax 1 gram po x1 and follow up with health depart or PCP.  Attempting to contact MD

## 2015-07-02 ENCOUNTER — Telehealth: Payer: Self-pay | Admitting: Emergency Medicine

## 2015-07-02 NOTE — Telephone Encounter (Signed)
Post ED Visit - Positive Culture Follow-up: Successful Patient Follow-Up   Positive Chlamydia culture  [x]  Patient discharged without antimicrobial prescription and treatment is now indicated []  Organism is resistant to prescribed ED discharge antimicrobial []  Patient with positive blood cultures  Changes discussed with ED provider: Orlie Dakin, MD New antibiotic prescription: Zithromax one gram PO once Called to [facility] [phone]  Contacted patient, date 07/02/15, time 1215 ID verified x three. Patient notified of positive Chlamydia and need for treatment. STD instructions provided, patient verbalized understanding.   Ernesta Amble 07/02/2015, 12:20 PM

## 2015-07-07 ENCOUNTER — Telehealth (HOSPITAL_COMMUNITY): Payer: Self-pay

## 2015-07-14 ENCOUNTER — Encounter: Payer: Self-pay | Admitting: Obstetrics & Gynecology

## 2015-07-14 ENCOUNTER — Ambulatory Visit (INDEPENDENT_AMBULATORY_CARE_PROVIDER_SITE_OTHER): Payer: Medicaid Other | Admitting: Obstetrics & Gynecology

## 2015-07-14 VITALS — BP 132/84 | HR 80 | Temp 98.7°F | Resp 18 | Ht 60.0 in | Wt 112.6 lb

## 2015-07-14 DIAGNOSIS — A5901 Trichomonal vulvovaginitis: Secondary | ICD-10-CM

## 2015-07-14 DIAGNOSIS — A749 Chlamydial infection, unspecified: Secondary | ICD-10-CM

## 2015-07-14 MED ORDER — METRONIDAZOLE 500 MG PO TABS: 500.0000 mg | ORAL_TABLET | Freq: Two times a day (BID) | ORAL | Status: DC

## 2015-07-14 MED ORDER — AZITHROMYCIN 250 MG PO TABS
1000.0000 mg | ORAL_TABLET | Freq: Once | ORAL | Status: AC
  Administered 2015-07-14: 1000 mg via ORAL

## 2015-07-14 NOTE — Patient Instructions (Addendum)
Please establish with a primary care doctor for continue health care.  [facility] health and wellness office number is [phone]. You MUST have your partner treated to prevent reinfecting yourself.  They can seek treatment at the Health Department.  Do not engage in sexual activity for 2 weeks after treatment.   Trichomoniasis Trichomoniasis is an infection caused by an organism called Trichomonas. The infection can affect both women and men. In women, the outer female genitalia and the vagina are affected. In men, the penis is mainly affected, but the prostate and other reproductive organs can also be involved. Trichomoniasis is a sexually transmitted infection (STI) and is most often passed to another person through sexual contact.  RISK FACTORS  Having unprotected sexual intercourse.  Having sexual intercourse with an infected partner. SIGNS AND SYMPTOMS  Symptoms of trichomoniasis in women include:  Abnormal gray-green frothy vaginal discharge.  Itching and irritation of the vagina.  Itching and irritation of the area outside the vagina. Symptoms of trichomoniasis in men include:   Penile discharge with or without pain.  Pain during urination. This results from inflammation of the urethra. DIAGNOSIS  Trichomoniasis may be found during a Pap test or physical exam. Your health care provider may use one of the following methods to help diagnose this infection:  Examining vaginal discharge under a microscope. For men, urethral discharge would be examined.  Testing the pH of the vagina with a test tape.  Using a vaginal swab test that checks for the Trichomonas organism. A test is available that provides results within a few minutes.  Doing a culture test for the organism. This is not usually needed. TREATMENT   You may be given medicine to fight the infection. Women should inform their health care provider if they could be or are pregnant. Some medicines used to treat the  infection should not be taken during pregnancy.  Your health care provider may recommend over-the-counter medicines or creams to decrease itching or irritation.  Your sexual partner will need to be treated if infected. HOME CARE INSTRUCTIONS   Take medicines only as directed by your health care provider.  Take over-the-counter medicine for itching or irritation as directed by your health care provider.  Do not have sexual intercourse while you have the infection.  Women should not douche or wear tampons while they have the infection.  Discuss your infection with your partner. Your partner may have gotten the infection from you, or you may have gotten it from your partner.  Have your sex partner get examined and treated if necessary.  Practice safe, informed, and protected sex.  See your health care provider for other STI testing. SEEK MEDICAL CARE IF:   You still have symptoms after you finish your medicine.  You develop abdominal pain.  You have pain when you urinate.  You have bleeding after sexual intercourse.  You develop a rash.  Your medicine makes you sick or makes you throw up (vomit). MAKE SURE YOU:  Understand these instructions.  Will watch your condition.  Will get help right away if you are not doing well or get worse. Document Released: 06/12/2001 Document Revised: 05/03/2014 Document Reviewed: 09/28/2013 [employer] Patient Information 2015 [place], [place]. This information is not intended to replace advice given to you by your health care provider. Make sure you discuss any questions you have with your health care provider.  Chlamydia Chlamydia is an infection. It is spread from one person to another person during sexual contact. This infection can  be in the cervix, urine tube (urethra), throat, or bottom (rectum). This infection needs treatment. HOME CARE   Take your medicines (antibiotics) as told. Finish them even if you start to feel better.  Only  take medicine as told by your doctor.  Tell your sex partner(s) that you have chlamydia. They must also be treated.  Do not have sex until your doctor says it is okay.  Rest.  Eat healthy. Drink enough fluids to keep your pee (urine) clear or pale yellow.  Keep all doctor visits as told. GET HELP IF:  You have pain when you pee.  You have belly pain.  You have vaginal discharge.  You have pain during sex.  You have bleeding between periods and after sex.  You have a fever. GET HELP RIGHT AWAY IF:   You feel sick to your stomach (nauseous) or you throw up (vomit).  You sweat much more than normal (diaphoresis).  You have trouble swallowing. MAKE SURE YOU:   Understand these instructions.  Will watch your condition.  Will get help right away if you are not doing well or get worse. Document Released: 09/25/2008 Document Revised: 05/03/2014 Document Reviewed: 08/24/2013 [facility] Patient Information 2015 [place], [place]. This information is not intended to replace advice given to you by your health care provider. Make sure you discuss any questions you have with your health care provider.

## 2015-07-14 NOTE — Progress Notes (Signed)
Subjective: CC: STD/ED f/u HPI: Patient is a 19 y.o. female presenting to clinic today for Ed follow up. Concerns today include:  STD: Dx with Chlamydia and Trich at ED visit in June.  Was unable to afford Azithromycin so never was treated.  Endorses some nonspecific abdominal cramping.  Denies nausea, vomiting, fevers, vaginal discharge or irritation, abnormal vaginal bleeding.  Partner also has not been treated.    Social History Reviewed: active smoker. FamHx and MedHx updated.  Please see EMR.  ROS: All other systems reviewed and are negative.  Objective: Office vital signs reviewed. BP 132/84 mmHg  Pulse 80  Temp(Src) 98.7 F (37.1 C) (Oral)  Resp 18  Ht 5' (1.524 m)  Wt 112 lb 9.6 oz (51.075 kg)  BMI 21.99 kg/m2  SpO2 100%  LMP  (LMP Unknown)  Physical Examination:  General: Awake, alert, well nourished, NAD HEENT: Normal, EOMI Cardio: RRR, S1S2 heard, no murmurs appreciated Pulm: globally decreased breath sounds, no increased WOB. No wheeze GI: soft, NT/ND,+BS x4, no masses Extremities: WWP, No edema, cyanosis or clubbing; +2 pulses bilaterally  Assessment/ Plan 19 y.o. female   Trichomoniasis of vagina - Plan: metroNIDAZOLE (FLAGYL) 500 MG tablet  Chlamydia - Plan: azithromycin (ZITHROMAX) tablet 1,000 mg  -Patient to establish with PCP for continued health care -Patient to have partner treated.  Recommended seeking evaluation at [facility] -No intercourse x2 weeks. -Counseled/Cautioned ETOH consumption with Flagyl -Condoms provided -Return precautions reviewed. -Patient to follow up if no improvement in abdominal discomfort  Janora Norlander, DO PGY-2, [place]

## 2015-08-14 ENCOUNTER — Emergency Department (HOSPITAL_COMMUNITY)
Admission: EM | Admit: 2015-08-14 | Discharge: 2015-08-14 | Disposition: A | Discharge status: Home / Self Care | Payer: Medicaid Other | Attending: Emergency Medicine | Admitting: Emergency Medicine

## 2015-08-14 ENCOUNTER — Encounter (HOSPITAL_COMMUNITY): Payer: Self-pay | Admitting: Emergency Medicine

## 2015-08-14 DIAGNOSIS — Z880 Allergy status to penicillin: Secondary | ICD-10-CM | POA: Insufficient documentation

## 2015-08-14 DIAGNOSIS — R1012 Left upper quadrant pain: Secondary | ICD-10-CM | POA: Diagnosis not present

## 2015-08-14 DIAGNOSIS — R1013 Epigastric pain: Secondary | ICD-10-CM | POA: Insufficient documentation

## 2015-08-14 DIAGNOSIS — R1032 Left lower quadrant pain: Secondary | ICD-10-CM | POA: Diagnosis not present

## 2015-08-14 DIAGNOSIS — Z91040 Latex allergy status: Secondary | ICD-10-CM | POA: Diagnosis not present

## 2015-08-14 DIAGNOSIS — R109 Unspecified abdominal pain: Secondary | ICD-10-CM

## 2015-08-14 DIAGNOSIS — Z8742 Personal history of other diseases of the female genital tract: Secondary | ICD-10-CM | POA: Diagnosis not present

## 2015-08-14 DIAGNOSIS — Z720 Tobacco use: Secondary | ICD-10-CM | POA: Insufficient documentation

## 2015-08-14 DIAGNOSIS — Z872 Personal history of diseases of the skin and subcutaneous tissue: Secondary | ICD-10-CM | POA: Diagnosis not present

## 2015-08-14 DIAGNOSIS — Z3202 Encounter for pregnancy test, result negative: Secondary | ICD-10-CM | POA: Diagnosis not present

## 2015-08-14 LAB — URINALYSIS, ROUTINE W REFLEX MICROSCOPIC
Bilirubin Urine: NEGATIVE
GLUCOSE, UA: NEGATIVE mg/dL
KETONES UR: NEGATIVE mg/dL
Leukocytes, UA: NEGATIVE
Nitrite: NEGATIVE
Protein, ur: NEGATIVE mg/dL
Specific Gravity, Urine: 1.025 (ref 1.005–1.030)
Urobilinogen, UA: 0.2 mg/dL (ref 0.0–1.0)
pH: 6 (ref 5.0–8.0)

## 2015-08-14 LAB — URINE MICROSCOPIC-ADD ON

## 2015-08-14 LAB — PREGNANCY, URINE: Preg Test, Ur: NEGATIVE

## 2015-08-14 NOTE — ED Notes (Signed)
Pt reports LLQ pain, nausea,intermittent dizziness x1 week. Pt reports LMP was only "spotting,not like a regular period."

## 2015-08-14 NOTE — ED Provider Notes (Signed)
CSN: [id number]     Arrival date & time 08/14/15  1154 History  This chart was scribed for Ruth Morrison, MD by Zola Button, ED Scribe. This patient was seen in room APA17/APA17 and the patient's care was started at 12:21 PM.       Chief Complaint  Patient presents with  . Abdominal Pain   Patient is a 19 y.o. female presenting with abdominal pain. The history is provided by the patient. No language interpreter was used.  Abdominal Pain Pain location:  Epigastric, LLQ and LUQ Onset quality:  Gradual Progression:  Unchanged Chronicity:  New Associated symptoms: fatigue and nausea   Associated symptoms: no cough and no shortness of breath    HPI Comments: Ruth Daniels is a 19 y.o. female who presents to the Emergency Department complaining of gradual onset left-sided abdominal pain that started several days ago. Patient reports having associated nausea, fatigue, and back pain. She notes that she was supposed to start her menstrual period 2 days ago, but it has not started although she has had some spotting; she normally has a heavy flow from start to finish with her periods. Patient denies leg swelling, vaginal discharge, cough, SOB, and headache. She also denies any medical problems, history of ovarian cysts, and history of ectopic pregnancies, miscarriages or other abnormal pregnancies. She has had similar pain with her previous pregnancy. She has not taken a pregnancy test yet. Patient does have a partner and denies any new sexual partners. She does have an OB/GYN doctor in [place].  Past Medical History  Diagnosis Date  . Allergy     Penicillin  . Eczema   . Chlamydia trachomatis infection in pregnancy   . GBS (group B streptococcus) UTI complicating pregnancy 9/89/2119  . Endometriosis   . PID (acute pelvic inflammatory disease)    Past Surgical History  Procedure Laterality Date  . Foot surgery     Family History  Problem Relation Age of Onset  . Depression Mother     Social History  Substance Use Topics  . Smoking status: Current Some Day Smoker -- 0.25 packs/day    Types: Cigarettes  . Smokeless tobacco: Never Used  . Alcohol Use: No   OB History    Gravida Para Term Preterm AB TAB SAB Ectopic Multiple Living   1 1 1       1      Review of Systems  Constitutional: Positive for fatigue.  Respiratory: Negative for cough and shortness of breath.   Cardiovascular: Negative for leg swelling.  Gastrointestinal: Positive for nausea and abdominal pain.  Musculoskeletal: Positive for back pain.  Neurological: Positive for dizziness. Negative for headaches.      Allergies  Bee venom; Latex; Mushroom extract complex; and Penicillins  Home Medications   Prior to Admission medications   Medication Sig Start Date End Date Taking? Authorizing Provider  diphenhydrAMINE (BENADRYL) 25 MG tablet Take 1 tablet (25 mg total) by mouth every 6 (six) hours as needed for itching or allergies. 06/25/15  Yes Jennifer Piepenbrink, PA-C  EPINEPHrine 0.3 mg/0.3 mL IJ SOAJ injection Inject 0.3 mLs (0.3 mg total) into the muscle once. 06/25/15  Yes Jennifer Piepenbrink, PA-C  famotidine (PEPCID) 20 MG tablet Take 1 tablet (20 mg total) by mouth 2 (two) times daily. Patient not taking: Reported on 07/14/2015 05/23/15   Carlisle Cater, PA-C  metroNIDAZOLE (FLAGYL) 500 MG tablet Take 1 tablet (500 mg total) by mouth 2 (two) times daily. Patient not taking: Reported on 08/14/2015  07/14/15   Ashly Windell Moulding, DO  predniSONE (DELTASONE) 20 MG tablet Take 2 tablets (40 mg total) by mouth daily. Patient not taking: Reported on 07/14/2015 05/23/15   Carlisle Cater, PA-C   BP 108/70 mmHg  Pulse 80  Temp(Src) 98.7 F (37.1 C) (Oral)  Resp 16  Ht 5\' 1"  (1.549 m)  Wt 120 lb (54.432 kg)  BMI 22.69 kg/m2  SpO2 100%  LMP 07/07/2015  Breastfeeding? No Physical Exam  Constitutional: She is oriented to person, place, and time. She appears well-developed and well-nourished. No  distress.  HENT:  Head: Normocephalic and atraumatic.  Mouth/Throat: Oropharynx is clear and moist. No oropharyngeal exudate.  Eyes: Pupils are equal, round, and reactive to light.  Neck: Neck supple.  Cardiovascular: Normal rate.   Pulmonary/Chest: Effort normal.  Abdominal:  No posterior flank pain. Mild discomfort, epigastrium and left upper, middle, and lower abdomen.  Genitourinary:  No cmt, no discharge, no bleeding  Musculoskeletal: She exhibits no edema.  Neurological: She is alert and oriented to person, place, and time. No cranial nerve deficit.  Skin: Skin is warm and dry. No rash noted.  Psychiatric: She has a normal mood and affect. Her behavior is normal.  Nursing note and vitals reviewed.   ED Course  Procedures  DIAGNOSTIC STUDIES: Oxygen Saturation is 100% on room air, normal by my interpretation.    COORDINATION OF CARE: 12:26 PM-Discussed treatment plan which includes labs and pelvic exam with patient/guardian at bedside and patient/guardian agreed to plan.    Labs Review Labs Reviewed  PREGNANCY, URINE  URINALYSIS, ROUTINE W REFLEX MICROSCOPIC (NOT AT [facility])    Imaging Review No results found. I personally reviewed and evaluated these images and lab results as part of my medical decision-making.   EKG Interpretation None      MDM   Final diagnoses:  None   Well-appearing female presents with vaginal spotting and very mild left mid abdominal discomfort. No significant abdominal pain on exam no peritonitis. Low suspicion for significant pathology at this time. Vitals normal. Pelvic exam unremarkable. Urinalysis is not pregnant no infection. Discussed close follow-up outpatient constrict reasons to return. We will hold on imaging at this time\\. Results and differential diagnosis were discussed with the patient/parent/guardian. Xrays were independently reviewed by myself.  Close follow up outpatient was discussed, comfortable with the plan.    Medications - No data to display  Filed Vitals:   08/14/15 1158  BP: 108/70  Pulse: 80  Temp: 98.7 F (37.1 C)  TempSrc: Oral  Resp: 16  Height: 5\' 1"  (1.549 m)  Weight: 120 lb (54.432 kg)  SpO2: 100%    Final diagnoses:  Abdominal discomfort      Ruth Morrison, MD 08/14/15 1344

## 2015-08-14 NOTE — Discharge Instructions (Signed)
If you were given medicines take as directed.  If you are on coumadin or contraceptives realize their levels and effectiveness is altered by many different medicines.  If you have any reaction (rash, tongues swelling, other) to the medicines stop taking and see a physician.   If your abdominal pain worsens, you develop fevers, persistent vomiting or if your pain moves to the right lower quadrant return immediately to see your physician or come to the Emergency Department.  Thank you  If your blood pressure was elevated in the ER make sure you follow up for management with a primary doctor or return for chest pain, shortness of breath or stroke symptoms.  Please follow up as directed and return to the ER or see a physician for new or worsening symptoms.  Thank you. Filed Vitals:   08/14/15 1158  BP: 108/70  Pulse: 80  Temp: 98.7 F (37.1 C)  TempSrc: Oral  Resp: 16  Height: 5\' 1"  (1.549 m)  Weight: 120 lb (54.432 kg)  SpO2: 100%

## 2015-08-15 LAB — GC/CHLAMYDIA PROBE AMP (~~LOC~~) NOT AT ARMC
Chlamydia: NEGATIVE
NEISSERIA GONORRHEA: NEGATIVE

## 2015-10-06 ENCOUNTER — Encounter (HOSPITAL_COMMUNITY): Payer: Self-pay | Admitting: Emergency Medicine

## 2015-10-06 ENCOUNTER — Emergency Department (HOSPITAL_COMMUNITY)
Admission: EM | Admit: 2015-10-06 | Discharge: 2015-10-07 | Disposition: A | Discharge status: Other Acute Inpatient Hospital | Payer: Medicaid Other | Attending: Emergency Medicine | Admitting: Emergency Medicine

## 2015-10-06 ENCOUNTER — Emergency Department (HOSPITAL_COMMUNITY): Payer: Medicaid Other

## 2015-10-06 DIAGNOSIS — Z91040 Latex allergy status: Secondary | ICD-10-CM | POA: Diagnosis not present

## 2015-10-06 DIAGNOSIS — Z880 Allergy status to penicillin: Secondary | ICD-10-CM | POA: Insufficient documentation

## 2015-10-06 DIAGNOSIS — Z87440 Personal history of urinary (tract) infections: Secondary | ICD-10-CM | POA: Diagnosis not present

## 2015-10-06 DIAGNOSIS — Z720 Tobacco use: Secondary | ICD-10-CM | POA: Insufficient documentation

## 2015-10-06 DIAGNOSIS — Z8742 Personal history of other diseases of the female genital tract: Secondary | ICD-10-CM | POA: Diagnosis not present

## 2015-10-06 DIAGNOSIS — R112 Nausea with vomiting, unspecified: Secondary | ICD-10-CM | POA: Insufficient documentation

## 2015-10-06 DIAGNOSIS — F1210 Cannabis abuse, uncomplicated: Secondary | ICD-10-CM | POA: Insufficient documentation

## 2015-10-06 DIAGNOSIS — R45851 Suicidal ideations: Secondary | ICD-10-CM | POA: Diagnosis present

## 2015-10-06 DIAGNOSIS — Z8619 Personal history of other infectious and parasitic diseases: Secondary | ICD-10-CM | POA: Diagnosis not present

## 2015-10-06 DIAGNOSIS — Z872 Personal history of diseases of the skin and subcutaneous tissue: Secondary | ICD-10-CM | POA: Diagnosis not present

## 2015-10-06 DIAGNOSIS — R079 Chest pain, unspecified: Secondary | ICD-10-CM | POA: Insufficient documentation

## 2015-10-06 LAB — BASIC METABOLIC PANEL
Anion gap: 10 (ref 5–15)
BUN: 17 mg/dL (ref 6–20)
CHLORIDE: 101 mmol/L (ref 101–111)
CO2: 25 mmol/L (ref 22–32)
CREATININE: 0.64 mg/dL (ref 0.44–1.00)
Calcium: 9.1 mg/dL (ref 8.9–10.3)
GFR calc Af Amer: >60 mL/min (ref >60)
GFR calc non Af Amer: >60 mL/min (ref >60)
Glucose, Bld: 103 mg/dL — ABNORMAL HIGH (ref 65–99)
Potassium: 3.1 mmol/L — ABNORMAL LOW (ref 3.5–5.1)
Sodium: 136 mmol/L (ref 135–145)

## 2015-10-06 LAB — CBC
HCT: 37.9 % (ref 36.0–46.0)
Hemoglobin: 13.4 g/dL (ref 12.0–15.0)
MCH: 31.5 pg (ref 26.0–34.0)
MCHC: 35.4 g/dL (ref 30.0–36.0)
MCV: 89 fL (ref 78.0–100.0)
PLATELETS: 244 10*3/uL (ref 150–400)
RBC: 4.26 MIL/uL (ref 3.87–5.11)
RDW: 11.9 % (ref 11.5–15.5)
WBC: 7.3 10*3/uL (ref 4.0–10.5)

## 2015-10-06 LAB — RAPID URINE DRUG SCREEN, HOSP PERFORMED
Amphetamines: NOT DETECTED
BENZODIAZEPINES: NOT DETECTED
Barbiturates: NOT DETECTED
COCAINE: NOT DETECTED
Opiates: NOT DETECTED
Tetrahydrocannabinol: POSITIVE — AB

## 2015-10-06 LAB — ETHANOL

## 2015-10-06 LAB — I-STAT TROPONIN, ED: TROPONIN I, POC: 0 ng/mL (ref 0.00–0.08)

## 2015-10-06 NOTE — ED Provider Notes (Signed)
CSN: [id number]     Arrival date & time 10/06/15  1928 History   First MD Initiated Contact with Patient 10/06/15 2124     Chief Complaint  Patient presents with  . Chest Pain     (Consider location/radiation/quality/duration/timing/severity/associated sxs/prior Treatment) Patient is a 19 y.o. female presenting with chest pain and mental health disorder.  Chest Pain Pain location:  Substernal area Pain quality: sharp   Pain radiates to:  Does not radiate Pain radiates to the back: no   Pain severity:  Moderate Onset quality:  Gradual Duration:  1 month Timing:  Intermittent Chronicity:  New Context comment:  Only with stress Relieved by:  Nothing Worsened by:  Nothing tried Ineffective treatments:  None tried Associated symptoms: anxiety, nausea and vomiting   Associated symptoms: no abdominal pain and no dizziness   Mental Health Problem Presenting symptoms: suicidal thoughts   Onset quality:  Gradual Duration:  3 days Timing:  Constant Progression:  Worsening Associated symptoms: chest pain   Associated symptoms: no abdominal pain     Past Medical History  Diagnosis Date  . Allergy     Penicillin  . Eczema   . Chlamydia trachomatis infection in pregnancy   . GBS (group B streptococcus) UTI complicating pregnancy 8/46/9629  . Endometriosis   . PID (acute pelvic inflammatory disease)    Past Surgical History  Procedure Laterality Date  . Foot surgery     Family History  Problem Relation Age of Onset  . Depression Mother    Social History  Substance Use Topics  . Smoking status: Current Some Day Smoker -- 0.25 packs/day    Types: Cigarettes  . Smokeless tobacco: Never Used  . Alcohol Use: No   OB History    Gravida Para Term Preterm AB TAB SAB Ectopic Multiple Living   1 1 1       1      Review of Systems  Cardiovascular: Positive for chest pain.  Gastrointestinal: Positive for nausea and vomiting. Negative for abdominal pain.  Neurological:  Negative for dizziness.  Psychiatric/Behavioral: Positive for suicidal ideas.  All other systems reviewed and are negative.     Allergies  Bee venom; Latex; Mushroom extract complex; and Penicillins  Home Medications   Prior to Admission medications   Medication Sig Start Date End Date Taking? Authorizing Provider  EPINEPHrine 0.3 mg/0.3 mL IJ SOAJ injection Inject 0.3 mLs (0.3 mg total) into the muscle once. 06/25/15  Yes Jennifer Piepenbrink, PA-C  diphenhydrAMINE (BENADRYL) 25 MG tablet Take 1 tablet (25 mg total) by mouth every 6 (six) hours as needed for itching or allergies. Patient not taking: Reported on 10/06/2015 06/25/15   Baron Sane, PA-C  famotidine (PEPCID) 20 MG tablet Take 1 tablet (20 mg total) by mouth 2 (two) times daily. Patient not taking: Reported on 07/14/2015 05/23/15   Carlisle Cater, PA-C  metroNIDAZOLE (FLAGYL) 500 MG tablet Take 1 tablet (500 mg total) by mouth 2 (two) times daily. Patient not taking: Reported on 08/14/2015 07/14/15   Janora Norlander, DO  predniSONE (DELTASONE) 20 MG tablet Take 2 tablets (40 mg total) by mouth daily. Patient not taking: Reported on 07/14/2015 05/23/15   Carlisle Cater, PA-C   BP 115/74 mmHg  Pulse 77  Temp(Src) 98.2 F (36.8 C) (Oral)  Resp 20  Ht 5\' 1"  (1.549 m)  Wt 120 lb (54.432 kg)  BMI 22.69 kg/m2  SpO2 99%  LMP 09/18/2015 Physical Exam  Constitutional: She is oriented to person, place,  and time. She appears well-developed and well-nourished.  HENT:  Head: Normocephalic and atraumatic.  Right Ear: External ear normal.  Left Ear: External ear normal.  Tenderness of L temporal area with palpable muscle spasm  Eyes: Conjunctivae and EOM are normal. Pupils are equal, round, and reactive to light.  Neck: Normal range of motion. Neck supple.  Cardiovascular: Normal rate, regular rhythm, normal heart sounds and intact distal pulses.   Pulmonary/Chest: Effort normal and breath sounds normal.  Abdominal: Soft.  Bowel sounds are normal. There is no tenderness.  Musculoskeletal: Normal range of motion.  Neurological: She is alert and oriented to person, place, and time.  Skin: Skin is warm and dry.  Vitals reviewed.   ED Course  Procedures (including critical care time) Labs Review Labs Reviewed  BASIC METABOLIC PANEL - Abnormal; Notable for the following:    Potassium 3.1 (*)    Glucose, Bld 103 (*)    All other components within normal limits  CBC  ETHANOL  URINE RAPID DRUG SCREEN, HOSP PERFORMED  I-STAT TROPOININ, ED    Imaging Review Dg Chest 2 View  10/06/2015   CLINICAL DATA:  Left-sided chest pain for 1 month. Intermittent cough.  EXAM: CHEST  2 VIEW  COMPARISON:  02/14/2015  FINDINGS: The heart size and mediastinal contours are within normal limits. Both lungs are clear. The visualized skeletal structures are unremarkable.  IMPRESSION: No active cardiopulmonary disease.   Electronically Signed   By: Andreas Newport M.D.   On: 10/06/2015 20:35   I have personally reviewed and evaluated these images and lab results as part of my medical decision-making.   EKG Interpretation   Date/Time:  Thursday October 06 2015 19:34:57 EDT Ventricular Rate:  76 PR Interval:  138 QRS Duration: 82 QT Interval:  370 QTC Calculation: 416 R Axis:   86 Text Interpretation:  Normal sinus rhythm Possible Left atrial enlargement  Borderline ECG No significant change since last tracing Confirmed by  Debby Freiberg [phone]) on 10/06/2015 9:39:59 PM      MDM   Final diagnoses:  Suicidal ideation    19 y.o. female without pertinent PMH presents with intermittent chest pain x 1 month, but ultimately endorses SI with plan to overdose on pills.  Exam benign.  Initial trop without new symptoms in 6 hours unremarkable.  EKG unremarkable.  PERC negative. Consulted Psych..    I have reviewed all laboratory and imaging studies if ordered as above  1. Suicidal ideation       Debby Freiberg,  MD 10/06/15 2235

## 2015-10-06 NOTE — ED Notes (Signed)
After speaking to PT she is also stating that she is SI and wants to hurt herself. Notified RN and asked PT to change into scrubs.

## 2015-10-06 NOTE — ED Notes (Signed)
Sitter at bedside. Security in to wand pt

## 2015-10-06 NOTE — ED Notes (Signed)
Pt. reports intermittent central chest pain for 1 month denies SOB , no nausea or diaphoresis , no cough or congestion , pt. also reported " knot" at left side of head onset 2 days ago , denies head injury.

## 2015-10-06 NOTE — ED Notes (Signed)
[place] Sandwich and apple juice provided.Marland KitchenMarland Kitchen

## 2015-10-06 NOTE — ED Notes (Signed)
CALLED FOR SITTER 

## 2015-10-07 ENCOUNTER — Encounter (HOSPITAL_COMMUNITY): Payer: Self-pay | Admitting: *Deleted

## 2015-10-07 ENCOUNTER — Inpatient Hospital Stay (HOSPITAL_COMMUNITY)
Admission: EM | Admit: 2015-10-07 | Discharge: 2015-10-14 | DRG: 885 | Disposition: A | Discharge status: Home / Self Care | Payer: Medicaid Other | Source: Intra-hospital | Attending: Psychiatry | Admitting: Psychiatry

## 2015-10-07 DIAGNOSIS — R45851 Suicidal ideations: Secondary | ICD-10-CM | POA: Diagnosis not present

## 2015-10-07 DIAGNOSIS — Z91410 Personal history of adult physical and sexual abuse: Secondary | ICD-10-CM

## 2015-10-07 DIAGNOSIS — F251 Schizoaffective disorder, depressive type: Principal | ICD-10-CM | POA: Diagnosis present

## 2015-10-07 DIAGNOSIS — F4310 Post-traumatic stress disorder, unspecified: Secondary | ICD-10-CM | POA: Diagnosis present

## 2015-10-07 DIAGNOSIS — F329 Major depressive disorder, single episode, unspecified: Secondary | ICD-10-CM | POA: Diagnosis present

## 2015-10-07 DIAGNOSIS — Z590 Homelessness: Secondary | ICD-10-CM

## 2015-10-07 DIAGNOSIS — F1220 Cannabis dependence, uncomplicated: Secondary | ICD-10-CM | POA: Clinically undetermined

## 2015-10-07 DIAGNOSIS — F1210 Cannabis abuse, uncomplicated: Secondary | ICD-10-CM | POA: Diagnosis not present

## 2015-10-07 DIAGNOSIS — F17210 Nicotine dependence, cigarettes, uncomplicated: Secondary | ICD-10-CM | POA: Diagnosis present

## 2015-10-07 DIAGNOSIS — F332 Major depressive disorder, recurrent severe without psychotic features: Secondary | ICD-10-CM | POA: Diagnosis not present

## 2015-10-07 MED ORDER — INFLUENZA VAC SPLIT QUAD 0.5 ML IM SUSY
0.5000 mL | PREFILLED_SYRINGE | INTRAMUSCULAR | Status: DC
  Filled 2015-10-07: qty 0.5

## 2015-10-07 MED ORDER — NICOTINE 21 MG/24HR TD PT24
21.0000 mg | MEDICATED_PATCH | Freq: Every day | TRANSDERMAL | Status: DC
  Administered 2015-10-07: 21 mg via TRANSDERMAL
  Filled 2015-10-07 (×2): qty 1

## 2015-10-07 MED ORDER — DIPHENHYDRAMINE HCL 25 MG PO TABS
25.0000 mg | ORAL_TABLET | Freq: Four times a day (QID) | ORAL | Status: DC | PRN
  Filled 2015-10-07: qty 1

## 2015-10-07 MED ORDER — TRAZODONE HCL 50 MG PO TABS
50.0000 mg | ORAL_TABLET | Freq: Every evening | ORAL | Status: DC | PRN
  Administered 2015-10-07: 50 mg via ORAL
  Filled 2015-10-07: qty 1

## 2015-10-07 MED ORDER — ACETAMINOPHEN 325 MG PO TABS: 650.0000 mg | ORAL_TABLET | Freq: Four times a day (QID) | ORAL | Status: DC | PRN

## 2015-10-07 MED ORDER — FAMOTIDINE 20 MG PO TABS
20.0000 mg | ORAL_TABLET | Freq: Two times a day (BID) | ORAL | Status: DC
  Administered 2015-10-07 – 2015-10-14 (×14): 20 mg via ORAL
  Filled 2015-10-07 (×19): qty 1

## 2015-10-07 MED ORDER — ONDANSETRON HCL 4 MG PO TABS
4.0000 mg | ORAL_TABLET | Freq: Three times a day (TID) | ORAL | Status: DC | PRN
  Administered 2015-10-07: 4 mg via ORAL
  Filled 2015-10-07: qty 1

## 2015-10-07 MED ORDER — MAGNESIUM HYDROXIDE 400 MG/5ML PO SUSP: 30.0000 mL | Freq: Every day | ORAL | Status: DC | PRN

## 2015-10-07 MED ORDER — ALUM & MAG HYDROXIDE-SIMETH 200-200-20 MG/5ML PO SUSP: 30.0000 mL | ORAL | Status: DC | PRN

## 2015-10-07 NOTE — Progress Notes (Signed)
Pt did not attend wrap up group this evening.  

## 2015-10-07 NOTE — ED Notes (Signed)
PT moved to Rm C-21, report given to [place], [place]
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
## 2015-10-07 NOTE — Progress Notes (Addendum)
Patient ID: Ruth Daniels, female   DOB: 05/14/1996, 19 y.o.   MRN: [id number] D: Client complains of nausea this shift d/t nicotine patch. Client reports goal: "express how what I feel" "take one day at a time, cause my son needs me" Client shares that her sister recently died 06/11/15. "I think she knew cause she came home, moved back from [place]." "she died on the commode" "she was obese, had sleep apnea, and diabetes" "she told my mom she could smell death"  "she was fun" "she was a lesbian but she was nice to me" "she was active in the LGBT community" "I sleep in the room she did and relive her death daily" "a week later my uncle died and I saw him" A: Probation officer provided emotional support, encouraged client to consider grief counseling. Zofran given for nausea (see MAR). Nicotine patch dc'd, prefers gum. Staff will monitor q54min for safety. R: Client is safe on the unit, did not attend group.
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
## 2015-10-07 NOTE — ED Notes (Signed)
Snack and drink was given to patient. Diet order for lunch was ordered.

## 2015-10-08 ENCOUNTER — Encounter (HOSPITAL_COMMUNITY): Payer: Self-pay | Admitting: Psychiatry

## 2015-10-08 DIAGNOSIS — F332 Major depressive disorder, recurrent severe without psychotic features: Secondary | ICD-10-CM

## 2015-10-08 DIAGNOSIS — F251 Schizoaffective disorder, depressive type: Secondary | ICD-10-CM | POA: Diagnosis present

## 2015-10-08 DIAGNOSIS — F1220 Cannabis dependence, uncomplicated: Secondary | ICD-10-CM | POA: Clinically undetermined

## 2015-10-08 DIAGNOSIS — R45851 Suicidal ideations: Secondary | ICD-10-CM

## 2015-10-08 DIAGNOSIS — F4310 Post-traumatic stress disorder, unspecified: Secondary | ICD-10-CM

## 2015-10-08 LAB — LIPID PANEL
Cholesterol: 143 mg/dL (ref 0–200)
HDL: 52 mg/dL (ref >40)
LDL CALC: 75 mg/dL (ref 0–99)
TRIGLYCERIDES: 78 mg/dL (ref <150)
Total CHOL/HDL Ratio: 2.8 RATIO
VLDL: 16 mg/dL (ref 0–40)

## 2015-10-08 MED ORDER — BENZTROPINE MESYLATE 0.5 MG PO TABS
0.5000 mg | ORAL_TABLET | Freq: Every day | ORAL | Status: DC
  Administered 2015-10-08 – 2015-10-09 (×2): 0.5 mg via ORAL
  Filled 2015-10-08 (×5): qty 1

## 2015-10-08 MED ORDER — NICOTINE POLACRILEX 2 MG MT GUM
2.0000 mg | CHEWING_GUM | OROMUCOSAL | Status: DC | PRN
  Administered 2015-10-08: 2 mg via ORAL
  Filled 2015-10-08: qty 1

## 2015-10-08 MED ORDER — HYDROXYZINE HCL 25 MG PO TABS
25.0000 mg | ORAL_TABLET | Freq: Three times a day (TID) | ORAL | Status: DC | PRN
  Administered 2015-10-12 – 2015-10-13 (×2): 25 mg via ORAL
  Filled 2015-10-08 (×2): qty 1

## 2015-10-08 MED ORDER — CITALOPRAM HYDROBROMIDE 10 MG PO TABS
10.0000 mg | ORAL_TABLET | Freq: Every day | ORAL | Status: DC
  Administered 2015-10-08 – 2015-10-11 (×4): 10 mg via ORAL
  Filled 2015-10-08 (×6): qty 1

## 2015-10-08 MED ORDER — PRAZOSIN HCL 1 MG PO CAPS
1.0000 mg | ORAL_CAPSULE | Freq: Every day | ORAL | Status: DC
  Administered 2015-10-08 – 2015-10-10 (×3): 1 mg via ORAL
  Filled 2015-10-08 (×5): qty 1

## 2015-10-08 MED ORDER — RISPERIDONE 1 MG PO TABS
1.0000 mg | ORAL_TABLET | Freq: Every day | ORAL | Status: DC
  Administered 2015-10-08 – 2015-10-10 (×3): 1 mg via ORAL
  Filled 2015-10-08 (×5): qty 1

## 2015-10-08 MED ORDER — POTASSIUM CHLORIDE CRYS ER 10 MEQ PO TBCR
10.0000 meq | EXTENDED_RELEASE_TABLET | Freq: Two times a day (BID) | ORAL | Status: AC
  Administered 2015-10-08 – 2015-10-10 (×4): 10 meq via ORAL
  Filled 2015-10-08 (×5): qty 1

## 2015-10-08 NOTE — Progress Notes (Signed)
.  Psychoeducational Group Note    Date: 10/08/2015 Time:  0930    Goal Setting Purpose of Group: To be able to set a goal that is measurable and that can be accomplished in one day Participation Level:  Active  Participation Quality:  Appropriate  Affect:  Appropriate  Cognitive:  Oriented  Insight:  Improving  Engagement in Group:  Engaged  Additional Comments:  Pt attended the group and participated.  Pammie Chirino A 

## 2015-10-08 NOTE — [facility] Group Notes (Signed)
[facility] Group Notes:  (Clinical Social Work)  10/08/2015     1:15-2:30PM  Summary of Progress/Problems:   The main focus of today's process group was to learn how to use a decisional balance exercise to move forward in the Stages of Change.  Patients listed needs on the whiteboard and unhealthy coping techniques often used to fill needs.  Motivational Interviewing and the whiteboard were utilized to help patients explore in depth the perceived benefits and costs of unhealthy coping techniques, as well as the  benefits and costs of replacing that with a healthy coping skills.   The patient expressed that her own unhealthy coping involves anger outbursts that get her in a lot of trouble.  She processed feelings and potential helpful coping techniques.  Type of Therapy:  Group Therapy - Process   Participation Level:  Active  Participation Quality:  Attentive and Sharing  Affect:  Blunted, Depressed and Irritable  Cognitive:  Appropriate and Oriented  Insight:  Developing/Improving  Engagement in Therapy:  Engaged  Modes of Intervention:  Education, Motivational Interviewing  Selmer Dominion, LCSW 10/08/2015, 3:08 PM

## 2015-10-08 NOTE — Progress Notes (Signed)
Pt stated that she had a good day. One positive coping skill she has is to use breathing exercises when she starts to get angry. Pt wants to learn how to express her emotions instead of holding everything in.
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
## 2015-10-08 NOTE — Progress Notes (Signed)
Ruth Daniels is adjusting well to the hospital. She is up first thing this morning...completing her daily assessment and on it she wrote she has had suicidal thoughts ( but contracts with this nurse to stay safe).  She rates her depression, hopelessness and anxiety " 09/07/09", respectively.    A She attends her groups, is observed crying in the group and afterwards she shares with this nurse she is " trying to get healthier" She is encouraged by this Probation officer to complete todays Sat workbook exercises.   R Safety is in place and poc  cont.
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
## 2015-10-08 NOTE — [facility] Suicide Risk Assessment (Signed)
[facility] Admission Suicide Risk Assessment   Nursing information obtained from:    Demographic factors:    Current Mental Status:    Loss Factors:    Historical Factors:    Risk Reduction Factors:    Total Time spent with patient: 30 minutes Principal Problem: Schizoaffective disorder, depressive type ([place]) Diagnosis:   Patient Active Problem List   Diagnosis Date Noted  . Schizoaffective disorder, depressive type ([place]) [F25.1] 10/08/2015  . Cannabis use disorder, severe, dependence ([place]) [F12.20] 10/08/2015  . Suicidal ideation [R45.851] 10/07/2015  . IUD check up [Z30.431] 04/01/2014  . PTSD (post-traumatic stress disorder) [F43.10] 02/19/2012     Continued Clinical Symptoms:  Alcohol Use Disorder Identification Test Final Score (AUDIT): 0 The "Alcohol Use Disorders Identification Test", Guidelines for Use in Primary Care, Second Edition.  World Pharmacologist [facility] Score between 0-7:  no or low risk or alcohol related problems. Score between 8-15:  moderate risk of alcohol related problems. Score between 16-19:  high risk of alcohol related problems. Score 20 or above:  warrants further diagnostic evaluation for alcohol dependence and treatment.   CLINICAL FACTORS:   Depression:   Anhedonia Comorbid alcohol abuse/dependence Hopelessness Impulsivity Insomnia Alcohol/Substance Abuse/Dependencies Currently Psychotic Unstable or Poor Therapeutic Relationship Previous Psychiatric Diagnoses and Treatments   Musculoskeletal: Strength & Muscle Tone: within normal limits Gait & Station: normal Patient leans: N/A  Psychiatric Specialty Exam: Physical Exam  Review of Systems  Psychiatric/Behavioral: Positive for depression, suicidal ideas, hallucinations and substance abuse. The patient is nervous/anxious.   All other systems reviewed and are negative.   Blood pressure 119/60, pulse 81, temperature 98.6 F (37 C), temperature source Oral, resp. rate 16, height 5\' 1"   (1.549 m), weight 54.432 kg (120 lb), last menstrual period 09/18/2015.Body mass index is 22.69 kg/(m^2).  General Appearance: Disheveled  Eye Sport and exercise psychologist::  Fair  Speech:  Clear and Coherent  Volume:  Normal  Mood:  Anxious and Depressed  Affect:  Appropriate  Thought Process:  Goal Directed  Orientation:  Full (Time, Place, and Person)  Thought Content:  Hallucinations: Auditory  Suicidal Thoughts:  Yes.  without intent/plan  Homicidal Thoughts:  No  Memory:  Immediate;   Fair Recent;   Fair Remote;   Fair  Judgement:  Impaired  Insight:  Fair  Psychomotor Activity:  Normal  Concentration:  Fair  Recall:  [employer] [place]  Language: Fair  Akathisia:  No  Handed:  Right  AIMS (if indicated):     Assets:  Communication Skills Desire for Improvement  Sleep:     Cognition: WNL  ADL's:  Intact     COGNITIVE FEATURES THAT CONTRIBUTE TO RISK:  Closed-mindedness, Polarized thinking and Thought constriction (tunnel vision)    SUICIDE RISK:   Moderate:  Frequent suicidal ideation with limited intensity, and duration, some specificity in terms of plans, no associated intent, good self-control, limited dysphoria/symptomatology, some risk factors present, and identifiable protective factors, including available and accessible social support.  PLAN OF CARE: Patient will benefit from inpatient treatment and stabilization.  Estimated length of stay is 5-7 days.  Reviewed past medical records,treatment plan.  Will start a trial of Celexa 10 mg po daily for affective sx. Will add Risperidone 1 mg po qhs for AH/mood sx. Will add Cogentin 0.5 mg po qhs for PES. Will add Trazodone 50 mg po qhs prn for sleep. Will continue to monitor vitals ,medication compliance and treatment side effects while patient is here.  Will monitor for medical  issues as well as call consult as needed.  Reviewed labs K+ is low  ,will replace with Kdur. CSW will start working on disposition.  Patient to  participate in therapeutic milieu .       Medical Decision Making:  Review of Psycho-Social Stressors (1), Review or order clinical lab tests (1), Decision to obtain old records (1), Established Problem, Worsening (2), Review of Last Therapy Session (1), Review of Medication Regimen & Side Effects (2) and Review of New Medication or Change in Dosage (2)  I certify that inpatient services furnished can reasonably be expected to improve the patient's condition.   Brailyn Delman MD 10/08/2015, 3:21 PM

## 2015-10-09 MED ORDER — TRIAMCINOLONE ACETONIDE 0.1 % EX CREA
TOPICAL_CREAM | Freq: Two times a day (BID) | CUTANEOUS | Status: DC
  Administered 2015-10-09 – 2015-10-12 (×7): via TOPICAL
  Filled 2015-10-09: qty 15

## 2015-10-09 NOTE — Progress Notes (Signed)
Patient ID: Jourdin Gens, female   DOB: 1996-12-01, 19 y.o.   MRN: [id number] D: Client visible on the unit, in dayroom watching TV and noted on the phone. Client reports depression as "5" of 10, talks about BF whom she love has already taken up with another girl. Client begins to smile when she talked about her son. Client currently denies SI, notes groups have been helpful talked about not blaming yourself, not putting yourself down or talking bad to yourself" A: Probation officer commends client for attending groups and participating, reviewed medication and administered as ordered. Staff will monitor q71min for safety. R: Client is safe on the unit, attended group.

## 2015-10-09 NOTE — Progress Notes (Signed)
D: Pt presents flat in affect but brightens upon interaction.Some mild anxiety inferred. Pt denied any SI/HI/AVH. Pt verbally contracts for safety. Pt is visible and active within the milieu. Pt reports that today should have been day 1 of her menstrual cycle. " I haven't been stressed".  Pt continues to have concern about the periodic pain she's having in her right nipple (sometimes bilateral) x1 month. Pt encouraged to speak with the psychiatrist in regards to this ongoing matter.   A: Writer administered scheduled medications to pt, per MD orders. Continued support and availability as needed was extended to this pt. Urine cup given for pregnancy test. Staff continue to monitor pt with q17min checks.  R: No adverse drug reactions noted. Pt receptive to treatment. Pt remains safe at this time.

## 2015-10-09 NOTE — [facility] Group Notes (Signed)
[place] Group Notes:  (Clinical Social Work)  10/09/2015  1:15-2:15PM  Summary of Progress/Problems:   The main focus of today's process group was to   1)  discuss the importance of adding supports  2)  define health supports versus unhealthy supports  3)  identify the patient's current unhealthy supports and plan how to handle them  4)  Identify the patient's current healthy supports and plan what to add.  An emphasis was placed on using counselor, doctor, therapy groups, 12-step groups, and problem-specific support groups to expand supports.    The patient expressed full comprehension of the concepts presented, and agreed that there is a need to add more supports.    Type of Therapy:  Process Group with Motivational Interviewing  Participation Level:  Active  Participation Quality:  Attentive, Sharing and Supportive  Affect:  Blunted and Excited  Cognitive:  Alert and Appropriate  Insight:  Improving  Engagement in Therapy:  Engaged  Modes of Intervention:   Education, Support and Processing, Activity  Selmer Dominion, LCSW 10/09/2015

## 2015-10-09 NOTE — Progress Notes (Signed)
[name] has complained of right breast pain today. She says she has had this pain ( she points directly at her right nipple area) " for about a month" and cannot relate any symptoms associated with it...ie, she denies CP, SOB, arm pain and /or radiating pain that travels from the breast ..elsehwhere on her body. A She says " Im beginning to worry because it wont go away""" R She is encouraged to speak with the provider and also notify this writer if symptoms change, increase and / or magnify.
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
## 2015-10-09 NOTE — Progress Notes (Signed)
Psychoeducational Group Note  Date: 10/09/2015 Time:  0930 Group Topic/Focus:  Gratefulness:  The focus of this group is to help patients identify what two things they are most grateful for in their lives. What helps ground them and to center them on their work to their recovery.  Participation Level:  Active  Participation Quality:  Appropriate  Affect:  Appropriate  Cognitive:  Oriented  Insight:  Improving  Engagement in Group:  Engaged  Additional Comments:  Pt attended and participated.  Paulino Rily

## 2015-10-10 DIAGNOSIS — F251 Schizoaffective disorder, depressive type: Principal | ICD-10-CM

## 2015-10-10 LAB — PREGNANCY, URINE: PREG TEST UR: NEGATIVE

## 2015-10-10 MED ORDER — BENZTROPINE MESYLATE 0.5 MG PO TABS: 0.5000 mg | ORAL_TABLET | Freq: Two times a day (BID) | ORAL | Status: DC | PRN

## 2015-10-10 NOTE — Plan of Care (Signed)
Problem: Diagnosis: Increased Risk For Suicide Attempt Goal: STG-Patient Will Comply With Medication Regime Outcome: Progressing She is compliant with medication management at this time.

## 2015-10-10 NOTE — Plan of Care (Signed)
Problem: Alteration in mood Goal: STG-Patient reports thoughts of self-harm to staff Outcome: Progressing Pt denies any SI. Pt verbally contracts for safety.

## 2015-10-10 NOTE — Progress Notes (Addendum)
Patient ID: Ruth Daniels, female   DOB: August 29, 1996, 19 y.o.   MRN: [id number] [facility] MD Progress Note  10/10/2015 4:38 PM Ruth Daniels  MRN:  [id number] Subjective: Patient reports ongoing depression, but states she is feeling " a little better ". At this time denies medication side effects . Objective : 19 year old female, presenting with depression. Has been diagnosed with Schizoaffective Disorder,  PTSD, and has history of Cannabis Abuse .  She has history of prior psychiatric admissions. Reports increased depression since death of a sister in May/50. Reports occasional auditory hallucinations telling her to " give up my baby, give him to someone else ". States that at one time the voice told her to hurt her child, but that she ignored this and she denies any current thoughts of hurting her child in any way. Reports that hallucinations have resolved and today has not heard any voices . She does not appear internally preoccupied at this time .  Patient states that her mood is depressed but improved today, compared to admission. Denies SI at this time. Does not endorse medication side effects. No disruptive or agitated behaviors on unit . She has gradually become more visible on the unit, better related with peers and staff . Pregnancy test negative   Principal Problem: Schizoaffective disorder, depressive type ([place]) Diagnosis:   Patient Active Problem List   Diagnosis Date Noted  . Schizoaffective disorder, depressive type ([place]) [F25.1] 10/08/2015  . Cannabis use disorder, severe, dependence ([place]) [F12.20] 10/08/2015  . Suicidal ideation [R45.851] 10/07/2015  . IUD check up [Z30.431] 04/01/2014  . PTSD (post-traumatic stress disorder) [F43.10] 02/19/2012   Total Time spent with patient:  25 minutes  Past Psychiatric History: PTSD, MDD, Suicidal attempts, Schizoaffective Disorder, cannabis use disorder  Past Medical History:  Past Medical History  Diagnosis Date  . Allergy      Penicillin  . Eczema   . Chlamydia trachomatis infection in pregnancy   . GBS (group B streptococcus) UTI complicating pregnancy 2/95/2841  . Endometriosis   . PID (acute pelvic inflammatory disease)     Past Surgical History  Procedure Laterality Date  . Foot surgery     Family History:  Family History  Problem Relation Age of Onset  . Depression Mother    Family Psychiatric  History: Unknown Social History: See HPI History  Alcohol Use No     History  Drug Use  . Yes  . Special: Marijuana    Social History   Social History  . Marital Status: Single    Spouse Name: N/A  . Number of Children: N/A  . Years of Education: N/A   Occupational History  . Student     12th grade- [employer]   Social History Main Topics  . Smoking status: Current Some Day Smoker -- 0.25 packs/day    Types: Cigarettes  . Smokeless tobacco: Never Used  . Alcohol Use: No  . Drug Use: Yes    Special: Marijuana  . Sexual Activity: Yes    Birth Control/ Protection: None   Other Topics Concern  . None   Social History Narrative   Additional Social History:    Sleep:  Improved   Appetite:  Fair, but has improved   Current Medications: Current Facility-Administered Medications  Medication Dose Route Frequency Provider Last Rate Last Dose  . acetaminophen (TYLENOL) tablet 650 mg  650 mg Oral Q6H PRN Encarnacion Slates, NP      . alum & mag hydroxide-simeth (  MAALOX/MYLANTA) 200-200-20 MG/5ML suspension 30 mL  30 mL Oral Q4H PRN Encarnacion Slates, NP      . benztropine (COGENTIN) tablet 0.5 mg  0.5 mg Oral QHS Ursula Alert, MD   0.5 mg at 10/09/15 2302  . citalopram (CELEXA) tablet 10 mg  10 mg Oral Daily Ursula Alert, MD   10 mg at 10/10/15 0829  . diphenhydrAMINE (BENADRYL) tablet 25 mg  25 mg Oral Q6H PRN Encarnacion Slates, NP      . famotidine (PEPCID) tablet 20 mg  20 mg Oral BID Encarnacion Slates, NP   20 mg at 10/10/15 2725  . hydrOXYzine (ATARAX/VISTARIL) tablet 25 mg  25 mg Oral TID  PRN Ursula Alert, MD      . Influenza vac split quadrivalent PF (FLUARIX) injection 0.5 mL  0.5 mL Intramuscular Tomorrow-1000 Myer Peer Srishti Strnad, MD   0.5 mL at 10/10/15 0830  . magnesium hydroxide (MILK OF MAGNESIA) suspension 30 mL  30 mL Oral Daily PRN Encarnacion Slates, NP      . nicotine polacrilex (NICORETTE) gum 2 mg  2 mg Oral PRN Jenne Campus, MD   2 mg at 10/08/15 0905  . ondansetron (ZOFRAN) tablet 4 mg  4 mg Oral Q8H PRN Harriet Butte, NP   4 mg at 10/07/15 2028  . prazosin (MINIPRESS) capsule 1 mg  1 mg Oral QHS Nanci Pina, FNP   1 mg at 10/09/15 2302  . risperiDONE (RISPERDAL) tablet 1 mg  1 mg Oral QHS Ursula Alert, MD   1 mg at 10/09/15 2302  . traZODone (DESYREL) tablet 50 mg  50 mg Oral QHS PRN Encarnacion Slates, NP   50 mg at 10/07/15 2238  . triamcinolone cream (KENALOG) 0.1 %   Topical BID Nanci Pina, FNP        Lab Results:  Results for orders placed or performed during the hospital encounter of 10/07/15 (from the past 48 hour(s))  Pregnancy, urine     Status: None   Collection Time: 10/10/15  6:37 AM  Result Value Ref Range   Preg Test, Ur NEGATIVE NEGATIVE    Comment:        THE SENSITIVITY OF THIS METHODOLOGY IS >20 mIU/mL. Performed at [facility]     Physical Findings: AIMS: Facial and Oral Movements Muscles of Facial Expression: None, normal Lips and Perioral Area: None, normal Jaw: None, normal Tongue: None, normal,Extremity Movements Upper (arms, wrists, hands, fingers): None, normal Lower (legs, knees, ankles, toes): None, normal, Trunk Movements Neck, shoulders, hips: None, normal, Overall Severity Severity of abnormal movements (highest score from questions above): None, normal Incapacitation due to abnormal movements: None, normal Patient's awareness of abnormal movements (rate only patient's report): No Awareness, Dental Status Current problems with teeth and/or dentures?: No Does patient usually wear dentures?: No   CIWA:    COWS:     Musculoskeletal: Strength & Muscle Tone: within normal limits Gait & Station: normal Patient leans: N/A  Psychiatric Specialty Exam: Review of Systems  Skin: Positive for itching and rash (hx of eczema).  Psychiatric/Behavioral: Positive for depression and suicidal ideas. Negative for hallucinations, memory loss and substance abuse. The patient is not nervous/anxious.   denies headache, denies chest pain, denies shortness of breath, no vomiting  Of note has reported breast pain, discomfort which may be related to, as per her information, her menstrual cycle, or may be related to antipsychotic medication- possible hyperprolactinemia   Blood pressure  120/60, pulse 71, temperature 98.7 F (37.1 C), temperature source Oral, resp. rate 16, height 5\' 1"  (1.549 m), weight 120 lb (54.432 kg), last menstrual period 09/11/2015.Body mass index is 22.69 kg/(m^2).  General Appearance: Fairly Groomed  Engineer, water::   Improved   Speech:  Normal Rate  Volume:  Normal  Mood:  Still depressed but reports improvement   Affect:   More reactive, less blunted   Thought Process:  Linear  Orientation:  Full (Time, Place, and Person)  Thought Content:  At this time denies any current auditory hallucinations, and does not appear internally preoccupied, no delusions expressed, does not appear overtly paranoid or bizarre in behavior   Suicidal Thoughts:  No at present denies any SI or self injurious ideations  Homicidal Thoughts:  No- specifically denies any current thoughts of HI or violence towards her child   Memory:  Immediate;   Good Recent;   Fair Remote;   Fair  Judgement:  Fair  Insight:  Fair and Lacking  Psychomotor Activity:  Normal  Concentration:  Good  Recall:  Good  Fund of Knowledge:Good  Language: Good  Akathisia:  No  Handed:  Right  AIMS (if indicated):     Assets:  Communication Skills Desire for Improvement Financial Resources/Insurance Leisure Time Physical  Health Social Support  ADL's:  Intact  Cognition: WNL  Sleep:  Number of Hours: 5.75   Treatment Plan Summary: Daily contact with patient to assess and evaluate symptoms and progress in treatment and Medication management   Daily contact with patient to assess and evaluate symptoms and progress in treatment and Medication management  1 Admit for crisis management and stabilization.  2. Continue Celexa at 10 mgrs QDAY  For depression and for PTSD symptoms 3. C ontinue Minipress 1 mgrs QHS for PTSD related nightmares  4. Continue Risperidone 1 mgr QHS for psychotic symptoms  5. Continue Trazodone 50 mgrs QHS PRN for insomnia as needed  5. Nicoderm patch to address cigarette cravings as needed  6. Encouraged to participate and verbalize into group milieu therapy. 7. Change Cogentin to 0.5 mgrs BID PRN for dystonia/ EPS  if needed  8. Order Prolactin Level, HgbA1C, TSH  , and repeat BMP to follow up on hypokalemia  Nyjah Schwake 10/10/2015, 4:38 PM

## 2015-10-10 NOTE — Plan of Care (Signed)
Problem: Alteration in mood Goal: STG-Patient reports thoughts of self-harm to staff Outcome: Progressing Patient has a roommate and at this time reports that she does not have any thoughts of self harm.

## 2015-10-10 NOTE — Progress Notes (Signed)
Patient ID: Ruth Daniels, female   DOB: 24-Jan-1996, 19 y.o.   MRN: [id number]  DAR: Pt. Denies SI/HI and A/V Hallucinations. She reports her sleep is good, appetite is good, energy level is normal, and concentration is good. She rates her depression, hopelessness, and anxiety at 2/10. Patient does report breast pain that is possibly related to her menstrual cycle which she has yet to start but per patient reports should be starting soon. Patient offered intervention however patient refuses at this time. Support and encouragement provided to the patient. Scheduled medications administered to patient per physician's orders. Patient is initially minimal and guarded but becomes more approachable in the afternoon. She is seen in the dayroom interacting with her peers this afternoon. Q15 minute checks are maintained for safety.

## 2015-10-10 NOTE — BHH Group Notes (Signed)
[facility] LCSW Aftercare Discharge Planning Group Note  10/10/2015 8:45 AM  Pt did not attend, declined invitation.   Peri Maris, [place] 10/10/2015 9:39 AM

## 2015-10-10 NOTE — Progress Notes (Addendum)
D- Patient found interacting in dayroom upon approach. Patient denies SI/HI and pain and endorses auditory hallucinations in the morning where the voices tell her that the, "Time has come", and patient reports that voices go away as the day progresses. Patient contracts for safety during inpatient stay.  Patient complains of itchiness in inner part of elbow bilaterally per rash.Patient rates depression a 3/10 and anxiety a 3/10 and states, "I've been doing better since I have attended groups and learning coping skills". Patient has a flat affect but is pleasant.  A- Given Kenalog cream for itching. Nurse provided reassurance and helped maintain a safe environment. Every 15 minute checks done to maintain safety.  R- Patient reports instant relief from itching once cream was applied.  Patient remains safe on the unit. Took all scheduled medication. Cooperative with care.

## 2015-10-10 NOTE — BHH Suicide Risk Assessment (Signed)
[place] INPATIENT:  Family/Significant Other Suicide Prevention Education  Suicide Prevention Education:  Education Completed; Ruth Daniels, Pt's mother [phone],  has been identified by the patient as the family member/significant other with whom the patient will be residing, and identified as the person(s) who will aid the patient in the event of a mental health crisis (suicidal ideations/suicide attempt).  With written consent from the patient, the family member/significant other has been provided the following suicide prevention education, prior to the and/or following the discharge of the patient.  The suicide prevention education provided includes the following:  Suicide risk factors  Suicide prevention and interventions  National Suicide Hotline telephone number  [facility] assessment telephone number  [facility] Emergency Assistance [place] and/or Residential Mobile Crisis Unit telephone number  Request made of family/significant other to:  Remove weapons (e.g., guns, rifles, knives), all items previously/currently identified as safety concern.    Remove drugs/medications (over-the-counter, prescriptions, illicit drugs), all items previously/currently identified as a safety concern.  The family member/significant other verbalizes understanding of the suicide prevention education information provided.  The family member/significant other agrees to remove the items of safety concern listed above.  Ruth Daniels 10/10/2015, 1:20 PM

## 2015-10-10 NOTE — BHH Group Notes (Signed)
[facility] LCSW Group Therapy 10/10/2015  1:15 pm  Type of Therapy: Group Therapy Participation Level: Active  Participation Quality: Attentive, Sharing and Supportive  Affect: Appropriate  Cognitive: Alert and Oriented  Insight: Developing/Improving and Engaged  Engagement in Therapy: Developing/Improving and Engaged  Modes of Intervention: Clarification, Confrontation, Discussion, Education, Exploration,  Limit-setting, Orientation, Problem-solving, Rapport Building, Art therapist, Socialization and Support  Summary of Progress/Problems: Pt identified obstacles faced currently and processed barriers involved in overcoming these obstacles. Pt identified steps necessary for overcoming these obstacles and explored motivation (internal and external) for facing these difficulties head on. Pt further identified one area of concern in their lives and chose a goal to focus on for today. Patient discussed having difficulty trusting and confiding in others due to being hurt in the past. She discussed her tendency to "run away" from her problems and continues to experience symptoms related to her trauma history. CSW and other group members provided patient with emotional support and encouragement.  Tilden Fossa, MSW, [place] Worker [facility] [phone]

## 2015-10-10 NOTE — Progress Notes (Signed)
Adult Psychoeducational Group Note  Date:  10/10/2015 Time:  8:25 PM  Group Topic/Focus:  Wrap-Up Group:   The focus of this group is to help patients review their daily goal of treatment and discuss progress on daily workbooks.  Participation Level:  Active  Participation Quality:  Appropriate  Affect:  Appropriate  Cognitive:  Appropriate  Insight: Good  Engagement in Group:  Engaged  Modes of Intervention:  Discussion  Additional Comments:  Pt rated overall day a 9 out of 10 because of positive interaction and communication with other patients on the unit. Pt noted that talking to her two-year-old son was a positive part of her day. Pt reported that her goal for the day was to be in a good mood and to "be out" on the unit more, which she feels that she achieved.   Lincoln Brigham 10/10/2015, 9:26 PM

## 2015-10-11 LAB — BASIC METABOLIC PANEL
Anion gap: 9 (ref 5–15)
BUN: 15 mg/dL (ref 6–20)
CHLORIDE: 104 mmol/L (ref 101–111)
CO2: 24 mmol/L (ref 22–32)
Calcium: 9 mg/dL (ref 8.9–10.3)
Creatinine, Ser: 0.69 mg/dL (ref 0.44–1.00)
GFR calc non Af Amer: >60 mL/min (ref >60)
Glucose, Bld: 114 mg/dL — ABNORMAL HIGH (ref 65–99)
POTASSIUM: 3.5 mmol/L (ref 3.5–5.1)
SODIUM: 137 mmol/L (ref 135–145)

## 2015-10-11 LAB — TSH: TSH: 1.274 u[IU]/mL (ref 0.350–4.500)

## 2015-10-11 MED ORDER — CITALOPRAM HYDROBROMIDE 20 MG PO TABS
20.0000 mg | ORAL_TABLET | Freq: Every day | ORAL | Status: DC
  Administered 2015-10-12 – 2015-10-14 (×3): 20 mg via ORAL
  Filled 2015-10-11 (×5): qty 1

## 2015-10-11 MED ORDER — PRAZOSIN HCL 1 MG PO CAPS
1.0000 mg | ORAL_CAPSULE | Freq: Every day | ORAL | Status: DC
  Filled 2015-10-11 (×3): qty 1

## 2015-10-11 MED ORDER — IBUPROFEN 600 MG PO TABS
600.0000 mg | ORAL_TABLET | Freq: Four times a day (QID) | ORAL | Status: DC | PRN
  Administered 2015-10-11: 600 mg via ORAL
  Filled 2015-10-11 (×2): qty 1

## 2015-10-11 MED ORDER — PRAZOSIN HCL 2 MG PO CAPS
2.0000 mg | ORAL_CAPSULE | Freq: Every day | ORAL | Status: DC
  Filled 2015-10-11 (×2): qty 1

## 2015-10-11 MED ORDER — RISPERIDONE 2 MG PO TABS
2.0000 mg | ORAL_TABLET | Freq: Every day | ORAL | Status: DC
  Filled 2015-10-11 (×3): qty 1

## 2015-10-11 MED ORDER — PROPRANOLOL HCL 10 MG PO TABS
15.0000 mg | ORAL_TABLET | Freq: Once | ORAL | Status: AC
Start: 2015-10-11 — End: 2015-10-11
  Administered 2015-10-11: 15 mg via ORAL
  Filled 2015-10-11: qty 2
  Filled 2015-10-11: qty 1.5

## 2015-10-11 NOTE — BHH Group Notes (Signed)
The focus of this group is to educate the patient on the purpose and policies of crisis stabilization and provide a format to answer questions about their admission.  The group details unit policies and expectations of patients while admitted.  Patient attended 0900 nurse education orientation group this morning.  Patient actively participated and had appropriate affect.  Patient was alert.  Patient had appropriate insight and actively engaged in group.  Today patient will work on 3 goals for discharge.

## 2015-10-11 NOTE — BHH Group Notes (Signed)
[place] [place] LCSW Group Therapy 10/11/2015 1:15 PM  Type of Therapy: Group Therapy- Feelings about Diagnosis  Participation Level: Active   Participation Quality:  Appropriate  Affect:  Appropriate  Cognitive: Alert and Oriented   Insight:  Developing   Engagement in Therapy: Developing/Improving and Engaged   Modes of Intervention: Clarification, Confrontation, Discussion, Education, Exploration, Limit-setting, Orientation, Problem-solving, Rapport Building, Art therapist, Socialization and Support  Description of Group:   This group will allow patients to explore their thoughts and feelings about diagnoses they have received. Patients will be guided to explore their level of understanding and acceptance of these diagnoses. Facilitator will encourage patients to process their thoughts and feelings about the reactions of others to their diagnosis, and will guide patients in identifying ways to discuss their diagnosis with significant others in their lives. This group will be process-oriented, with patients participating in exploration of their own experiences as well as giving and receiving support and challenge from other group members.  Summary of Progress/Problems:  Pt identified feeling isolated due to people calling her "crazy" and telling her that she has "lost her marbles." Pt reports feeling like that is an unfair accusation of her mental health issues. She identified multiple attributes that she values in her life.   Therapeutic Modalities:   Cognitive Behavioral Therapy Solution Focused Therapy Motivational Interviewing Relapse Prevention Therapy  Peri Maris, LCSWA 10/11/2015 5:17 PM

## 2015-10-11 NOTE — Tx Team (Signed)
Interdisciplinary Treatment Plan Update (Adult) Date: 10/11/2015   Date: 10/11/2015 3:33 PM  Progress in Treatment:  Attending groups: Yes  Participating in groups: Yes  Taking medication as prescribed: Yes  Tolerating medication: Yes  Family/Significant othe contact made: Yes, with mother Patient understands diagnosis: Yes Discussing patient identified problems/goals with staff: Yes  Medical problems stabilized or resolved: Yes  Denies suicidal/homicidal ideation: Yes Patient has not harmed self or Others: Yes   New problem(s) identified: None identified at this time.   Discharge Plan or Barriers: Pt will return home and follow-up with [facility]  Additional comments: n/a   Reason for Continuation of Hospitalization:  Anxiety Depression Medication stabilization Suicidal ideation  Estimated length of stay: 3-5 days  Review of initial/current patient goals per problem list:   1.  Goal(s): Patient will participate in aftercare plan  Met:  Yes  Target date: 3-5 days from date of admission   As evidenced by: Patient will participate within aftercare plan AEB aftercare provider and housing plan at discharge being identified.   10/11/15: Pt will return home and follow-up with [facility]  2.  Goal (s): Patient will exhibit decreased depressive symptoms and suicidal ideations.  Met:  Yes  Target date: 3-5 days from date of admission   As evidenced by: Patient will utilize self rating of depression at 3 or below and demonstrate decreased signs of depression or be deemed stable for discharge by MD.  10/11/15: Pt rates depression at 3/10. Denies SI  3.  Goal(s): Patient will demonstrate decreased signs and symptoms of anxiety.  Met:  Yes  Target date: 3-5 days from date of admission   As evidenced by: Patient will utilize self rating of anxiety at 3 or below and demonstrated decreased signs of anxiety, or be deemed stable for discharge by MD  10/11/15: Pt rates anxiety at  3/10.   Attendees:  Patient:    Family:    Physician: Dr. Parke Poisson, MD  10/11/2015 3:33 PM  Nursing: Lars Pinks, RN Case manager  10/11/2015 3:33 PM  Clinical Social Worker Norman Clay, MSW 10/11/2015 3:33 PM  Other: Jake Bathe Liasion 10/11/2015 3:33 PM  Clinical: Grayland Ormond RN 10/11/2015 3:33 PM  Other: , RN Charge Nurse 10/11/2015 3:33 PM  Other:     Peri Maris, Latanya Presser MSW

## 2015-10-11 NOTE — Progress Notes (Signed)
Patient ID: Ruth Daniels, female   DOB: 02/28/1996, 19 y.o.   MRN: [id number] [facility] MD Progress Note  10/11/2015 6:46 PM Ruth Daniels  MRN:  [id number] Subjective:  She reports partial improvement compared to admission, but states still feels depressed, and continues to experience auditory hallucinations. Denies medication side effects.  Objective : I  have discussed case with treatment team and have met with patient. Patient reports ongoing depression, although does endorse some improvement compared to admission. She endorses ongoing auditory hallucinations, which had resolved recently, but have reemerged . States it is voice telling her she is no good , that nobody cares about her . Of note denies any command hallucinations and does not present with any psychotic symptoms regarding her child ( in the past has heard voices telling her to give up her child ) . She does not present internally preoccupied . No disruptive behaviors on unit- going to groups, participating well . Patient does report partial improvement of mood since admission, and denies any suicidal ideations at this time. Of note, does not present internally preoccupied and no delusions are expressed .  No medication side effects. She states she is hoping that increased doses will " help me more "  TSH WNL, BMP unremarkable, except for slightly elevated glucose - 114.   Principal Problem: Schizoaffective disorder, depressive type ([place]) Diagnosis:   Patient Active Problem List   Diagnosis Date Noted  . Schizoaffective disorder, depressive type ([place]) [F25.1] 10/08/2015  . Cannabis use disorder, severe, dependence ([place]) [F12.20] 10/08/2015  . Suicidal ideation [R45.851] 10/07/2015  . IUD check up [Z30.431] 04/01/2014  . PTSD (post-traumatic stress disorder) [F43.10] 02/19/2012   Total Time spent with patient:  25 minutes  Past Psychiatric History: PTSD, MDD, Suicidal attempts, Schizoaffective Disorder, cannabis use  disorder  Past Medical History:  Past Medical History  Diagnosis Date  . Allergy     Penicillin  . Eczema   . Chlamydia trachomatis infection in pregnancy   . GBS (group B streptococcus) UTI complicating pregnancy 5/70/1779  . Endometriosis   . PID (acute pelvic inflammatory disease)     Past Surgical History  Procedure Laterality Date  . Foot surgery     Family History:  Family History  Problem Relation Age of Onset  . Depression Mother    Family Psychiatric  History: Unknown Social History: See HPI History  Alcohol Use No     History  Drug Use  . Yes  . Special: Marijuana    Social History   Social History  . Marital Status: Single    Spouse Name: N/A  . Number of Children: N/A  . Years of Education: N/A   Occupational History  . Student     12th grade- [employer]   Social History Main Topics  . Smoking status: Current Some Day Smoker -- 0.25 packs/day    Types: Cigarettes  . Smokeless tobacco: Never Used  . Alcohol Use: No  . Drug Use: Yes    Special: Marijuana  . Sexual Activity: Yes    Birth Control/ Protection: None   Other Topics Concern  . None   Social History Narrative   Additional Social History:    Sleep:  Improved   Appetite:  Fair, but has improved   Current Medications: Current Facility-Administered Medications  Medication Dose Route Frequency Provider Last Rate Last Dose  . acetaminophen (TYLENOL) tablet 650 mg  650 mg Oral Q6H PRN Encarnacion Slates, NP      .  alum & mag hydroxide-simeth (MAALOX/MYLANTA) 200-200-20 MG/5ML suspension 30 mL  30 mL Oral Q4H PRN Encarnacion Slates, NP      . benztropine (COGENTIN) tablet 0.5 mg  0.5 mg Oral BID PRN Jenne Campus, MD      . Derrill Memo ON 10/12/2015] citalopram (CELEXA) tablet 20 mg  20 mg Oral Daily Myer Peer Cobos, MD      . diphenhydrAMINE (BENADRYL) tablet 25 mg  25 mg Oral Q6H PRN Encarnacion Slates, NP      . famotidine (PEPCID) tablet 20 mg  20 mg Oral BID Encarnacion Slates, NP   20 mg at  10/11/15 1710  . hydrOXYzine (ATARAX/VISTARIL) tablet 25 mg  25 mg Oral TID PRN Ursula Alert, MD      . Influenza vac split quadrivalent PF (FLUARIX) injection 0.5 mL  0.5 mL Intramuscular Tomorrow-1000 Myer Peer Cobos, MD   0.5 mL at 10/10/15 0830  . magnesium hydroxide (MILK OF MAGNESIA) suspension 30 mL  30 mL Oral Daily PRN Encarnacion Slates, NP      . nicotine polacrilex (NICORETTE) gum 2 mg  2 mg Oral PRN Jenne Campus, MD   2 mg at 10/08/15 0905  . ondansetron (ZOFRAN) tablet 4 mg  4 mg Oral Q8H PRN Harriet Butte, NP   4 mg at 10/07/15 2028  . prazosin (MINIPRESS) capsule 2 mg  2 mg Oral QHS Myer Peer Cobos, MD      . risperiDONE (RISPERDAL) tablet 2 mg  2 mg Oral QHS Myer Peer Cobos, MD      . traZODone (DESYREL) tablet 50 mg  50 mg Oral QHS PRN Encarnacion Slates, NP   50 mg at 10/07/15 2238  . triamcinolone cream (KENALOG) 0.1 %   Topical BID Nanci Pina, FNP        Lab Results:  Results for orders placed or performed during the hospital encounter of 10/07/15 (from the past 48 hour(s))  Pregnancy, urine     Status: None   Collection Time: 10/10/15  6:37 AM  Result Value Ref Range   Preg Test, Ur NEGATIVE NEGATIVE    Comment:        THE SENSITIVITY OF THIS METHODOLOGY IS >20 mIU/mL. Performed at [facility] metabolic panel     Status: Abnormal   Collection Time: 10/11/15  7:40 AM  Result Value Ref Range   Sodium 137 135 - 145 mmol/L   Potassium 3.5 3.5 - 5.1 mmol/L   Chloride 104 101 - 111 mmol/L   CO2 24 22 - 32 mmol/L   Glucose, Bld 114 (H) 65 - 99 mg/dL   BUN 15 6 - 20 mg/dL   Creatinine, Ser 0.69 0.44 - 1.00 mg/dL   Calcium 9.0 8.9 - 10.3 mg/dL   GFR calc non Af Amer >60 >60 mL/min   GFR calc Af Amer >60 >60 mL/min    Comment: (NOTE) The eGFR has been calculated using the CKD EPI equation. This calculation has not been validated in all clinical situations. eGFR's persistently <60 mL/min signify possible Chronic Kidney Disease.     Anion gap 9 5 - 15    Comment: Performed at [facility]  TSH     Status: None   Collection Time: 10/11/15  7:40 AM  Result Value Ref Range   TSH 1.274 0.350 - 4.500 uIU/mL    Comment: Performed at [facility]    Physical Findings:  AIMS: Facial and Oral Movements Muscles of Facial Expression: None, normal Lips and Perioral Area: None, normal Jaw: None, normal Tongue: None, normal,Extremity Movements Upper (arms, wrists, hands, fingers): None, normal Lower (legs, knees, ankles, toes): None, normal, Trunk Movements Neck, shoulders, hips: None, normal, Overall Severity Severity of abnormal movements (highest score from questions above): None, normal Incapacitation due to abnormal movements: None, normal Patient's awareness of abnormal movements (rate only patient's report): No Awareness, Dental Status Current problems with teeth and/or dentures?: No Does patient usually wear dentures?: No  CIWA:  CIWA-Ar Total: 1 COWS:  COWS Total Score: 3  Musculoskeletal: Strength & Muscle Tone: within normal limits Gait & Station: normal Patient leans: N/A  Psychiatric Specialty Exam: Review of Systems  Skin: Positive for itching and rash (hx of eczema).  Psychiatric/Behavioral: Positive for depression and suicidal ideas. Negative for hallucinations, memory loss and substance abuse. The patient is not nervous/anxious.   denies headache, denies chest pain, denies shortness of breath, no vomiting  Of note has reported breast pain, discomfort which may be related to, as per her information, her menstrual cycle, or may be related to antipsychotic medication- possible hyperprolactinemia   Blood pressure 128/74, pulse 101, temperature 98 F (36.7 C), temperature source Oral, resp. rate 16, height 5' 1" (1.549 m), weight 120 lb (54.432 kg), last menstrual period 09/11/2015.Body mass index is 22.69 kg/(m^2).  General Appearance:  Improved grooming   Eye Contact::    Improved   Speech:  Normal Rate  Volume:  Normal  Mood:   Depression partially improved    Affect:    Still constricted but reactive   Thought Process:  Linear  Orientation:  Full (Time, Place, and Person)  Thought Content:  At this time describes   auditory hallucinations, but  does not appear internally preoccupied, overtly paranoid or bizarre in behavior - no delusions   Suicidal Thoughts:  No at present denies any SI or self injurious ideations  Homicidal Thoughts:  No- specifically denies any current thoughts of HI or violence towards her child   Memory: recent and remote grossly intact   Judgement:  Fair  Insight:  Fair  Psychomotor Activity:  Normal  Concentration:  Good  Recall:  Good  Fund of Knowledge:Good  Language: Good  Akathisia:  No  Handed:  Right  AIMS (if indicated):     Assets:  Communication Skills Desire for Improvement Financial Resources/Insurance Leisure Time Physical Health Social Support  ADL's:  Intact  Cognition: WNL  Sleep:  Number of Hours: 5.75  Assessment - patient partially improved compared to admission, but reports lingering depression, anxiety, and re-emergence of auditory hallucinations, which are demeaning and insulting in nature. Denies command hallucinations. She is not thought disordered, and behavior is in good control at this time.  She is tolerating medications well, and denies side effects. Agrees to further titration to address ongoing symptoms. Treatment Plan Summary: Daily contact with patient to assess and evaluate symptoms and progress in treatment and Medication management   Daily contact with patient to assess and evaluate symptoms and progress in treatment and Medication management  1 Continue inpatient admission for crisis management and stabilization.  2. Increase  Celexa  To 20  mgrs QDAY  For depression and for PTSD symptoms 3. Increase Minipress to 2 mgrs QHS for PTSD related nightmares  4. Increase  Risperidone to 2   mgr QHS for psychotic symptoms  5. Continue Trazodone 50 mgrs QHS PRN for insomnia as needed  5. Nicoderm  patch to address cigarette cravings as needed  6. Encouraged to continue  Group/ milieu  Participation to work on coping skills and symptom reduction 7. Continue Cogentin  0.5 mgrs BID PRN for dystonia/ EPS  if needed   COBOS, FERNANDO 10/11/2015, 6:46 PM

## 2015-10-11 NOTE — Progress Notes (Signed)
D:  Patient's self inventory sheet, patient has poor sleep, no sleep medication given.  Fair appetite, low energy level, poor concentration.  Rated depression and hopeless 8, anxiety 9.  Withdrawals, chilling, cravings, cramping, agitation.  SI sometimes, contracts for safety.   Physical pain, worst pain #9, chest, no pain medication.  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  Plans to try coping skills.  "The meds ain't working and I am seeing things!"  No discharge plans. A:  Medications administered per MD orders.  Emotional support and encouragement. R:  Patient stated she is SI, off/on, contracts for safety, no plan.  Denied HI.  Stated she does hear voices, "you should die, you are not worth anything, I can't do this, every time I try, it goes down hill, want to go to school to be a psychiatrist to help others.  I can't help myself, I've been through so much."  Stated she is seeing visions, but refused to discuss.  Safety maintained with 15 minute checks.

## 2015-10-11 NOTE — Progress Notes (Signed)
Recreation Therapy Notes  Animal-Assisted Activity (AAA) Program Checklist/Progress Notes Patient Eligibility Criteria Checklist & Daily Group note for Rec Tx Intervention  Date: 10.11.2016 Time: 2:45pm Location: [address] [employer]    AAA/T Program Assumption of Risk Form signed by Patient/ or Parent Legal Guardian yes  Patient is free of allergies or sever asthma yes  Patient reports no fear of animals yes  Patient reports no history of cruelty to animals yes  Patient understands his/her participation is voluntary yes  Patient washes hands before animal contact yes  Patient washes hands after animal contact yes  Behavioral Response: Appropriate   Education: Hand Washing, Appropriate Animal Interaction   Education Outcome: Acknowledges education.   Clinical Observations/Feedback: Patient engaged appropriately in session, petting therapy appropriately and interacting with peers appropriately.   Laureen Ochs Domini Vandehei, LRT/CTRS        Andris Brothers L 10/11/2015 3:10 PM

## 2015-10-11 NOTE — Progress Notes (Signed)
Adult Psychoeducational Group Note  Date:  10/11/2015 Time:  8:35 PM  Group Topic/Focus:  Wrap-Up Group:   The focus of this group is to help patients review their daily goal of treatment and discuss progress on daily workbooks.  Participation Level:  Minimal  Participation Quality:  Attentive  Affect:  Angry, Depressed and Irritable  Cognitive:  Lacking  Insight: Limited  Engagement in Group:  Lacking  Modes of Intervention:  Discussion  Additional Comments:  Pt seemed angry during wrap up group. Pt rated overall day a 2 out of 10 because of a phone call that she received on information about her son. Pt reported that nothing positive happened today and her goal was to stay positive which she feels that she did not achieve. Pt left wrap-up group early.   Lincoln Brigham 10/11/2015, 9:33 PM

## 2015-10-11 NOTE — Significant Event (Cosign Needed)
10/11/2015 @ 21:44  Patient presented to nursing staff with c/o of SSCP. Patient is denying any trauma, associated dyspepsia, diaphoresis or near syncope. Ms Bashor does endorse some pain with deep inhalation. Patient reportedly presented to the [facility] with c/o of chest and subsequent medical clearance prior to admission to [facility].  123/79 with reg HR of 63  An EKG was obtained c/w NSR, normal axis without acute ST and or TWI. Neuro: A & 0 x 3 spheres,  Integument W/D Pulm: CTA Cardio: audible S1,S2 without M/G/R Chest: Subternal margins tender to palpation, exacerbated with deep inhalation.  A/P: Verbal order for EKG completed Verbal order for Toradol 15 mg IM stat x one, Patient given Inderal 15 mg IM x one in error   Motrin 600 mg PO q 6 hours prn pain Hold Minipress and all sedative nigh time medications V/S q hour hour x 2, then Q 2 hours x 4, access for altered mentation, symptomatic bradycardia and or hypotension, is symptomatic transfer to [facility] for further evaluation and or intervention.  On call MD:

## 2015-10-12 LAB — HEMOGLOBIN A1C
Hgb A1c MFr Bld: 5.3 % (ref 4.8–5.6)
Mean Plasma Glucose: 105 mg/dL

## 2015-10-12 LAB — PROLACTIN: PROLACTIN: 88.3 ng/mL — AB (ref 4.8–23.3)

## 2015-10-12 MED ORDER — PRAZOSIN HCL 2 MG PO CAPS
2.0000 mg | ORAL_CAPSULE | Freq: Every day | ORAL | Status: DC
  Administered 2015-10-12 – 2015-10-13 (×2): 2 mg via ORAL
  Filled 2015-10-12 (×4): qty 1

## 2015-10-12 MED ORDER — RISPERIDONE 3 MG PO TABS
3.0000 mg | ORAL_TABLET | Freq: Every day | ORAL | Status: DC
  Administered 2015-10-12 – 2015-10-13 (×2): 3 mg via ORAL
  Filled 2015-10-12 (×4): qty 1

## 2015-10-12 MED ORDER — ENSURE ENLIVE PO LIQD
237.0000 mL | Freq: Two times a day (BID) | ORAL | Status: DC
  Administered 2015-10-12 – 2015-10-14 (×6): 237 mL via ORAL

## 2015-10-12 NOTE — Progress Notes (Signed)
D: Pt presents angry in affect and depressed in mood. Pt reports having AVH. Pt declined to elaborate on her hallucinations. Pt reports being suicidal with no plan. Pt reported having sharp stabbing chest pain that was 10/10. Pt complained of pain during inhalation. Pain was not radiating per pt. Pt's vitals WDL. Pt reports that this was not anxiety r/t as she typically exhibits with a headache during moments of high anxiety.  Pt's chart reviewed for past cardiac work-up for reported chest pain on admission to the ED. On-call provider contacted. EKG and Toradol 15 mg ordered. However, Probation officer ordered and administer propranolol in error (see on-call provider not for further details.  A: Writer followed-up with on-call provider who further assessed pt upon med error. Pt monitored with vitals, per care order. Continued support and availability as needed was extended to this pt. Staff continue to monitor pt with q3min checks.  R: No adverse drug reactions noted. Pt's HR remains above 60, per last obtained vitals. Pt receptive to treatment. Pt remains safe at this time.

## 2015-10-12 NOTE — Progress Notes (Signed)
Patient ID: Ruth Daniels, female   DOB: [birth year], 19 y.o.   MRN: [id number] D: Patient guarded this morning. Seemed to be upset after she ended a phone call.  Patient asked when her discharge is.  She states, "I'm just not ready to go."  Patient is worried about her discharge and where she will go afterwards.  She reports little improvement of symptoms since her admission.  She reports depressed and anxious mood.  She had no complaints of chest pain this morning.  She continues to voice suicidal ideation with no specific plan.  She also reports on-going auditory hallucinations.  Informed patient to meet with her social worker and doctor today to discuss discharge plans.  She is agreeable to that. A: Continue to monitor medication management and MD orders.  Safety checks completed every 15 minutes per protocol.  Offer support and encouragement as needed. R: Patient's behavior is appropriate.

## 2015-10-12 NOTE — Progress Notes (Signed)
Recreation Therapy Notes  Date: 10.12.2016 Time: 9:30am Location: [address] Group Room   Group Topic: Stress Management  Goal Area(s) Addresses:  Patient will actively participate in stress management techniques presented during session.   Behavioral Response: Did not attend.  Zyquan Crotty L Emari Demmer, LRT/CTRS        Meilani Edmundson L 10/12/2015 1:10 PM
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
## 2015-10-12 NOTE — BHH Group Notes (Signed)
[place] LCSW Group Therapy 10/12/2015 1:15 PM  Type of Therapy: Group Therapy- Emotion Regulation  Participation Level: Active   Participation Quality:  Appropriate  Affect: Appropriate  Cognitive: Alert and Oriented   Insight:  Developing/Improving  Engagement in Therapy: Developing/Improving and Engaged   Modes of Intervention: Clarification, Confrontation, Discussion, Education, Exploration, Limit-setting, Orientation, Problem-solving, Rapport Building, Art therapist, Socialization and Support  Summary of Progress/Problems: The topic for group today was emotional regulation. This group focused on both positive and negative emotion identification and allowed group members to process ways to identify feelings, regulate negative emotions, and find healthy ways to manage internal/external emotions. Group members were asked to reflect on a time when their reaction to an emotion led to a negative outcome and explored how alternative responses using emotion regulation would have benefited them. Group members were also asked to discuss a time when emotion regulation was utilized when a negative emotion was experienced. Pt active in group discussion, identifying anger as an emotion that is difficult for her to regulate, especially when someone consistently "pushes her buttons." she was receptive to feedback from peers regarding the power that she allows other people's actions to have over her. She also offered support and encouragement to another peer.   Peri Maris, [place] 10/12/2015 3:05 PM

## 2015-10-12 NOTE — BHH Group Notes (Signed)
[facility] LCSW Aftercare Discharge Planning Group Note  10/12/2015 8:45 AM  Pt did not attend, declined invitation.   Peri Maris, [place] 10/12/2015 9:31 AM

## 2015-10-13 LAB — HCG, QUANTITATIVE, PREGNANCY

## 2015-10-13 NOTE — Progress Notes (Signed)
D: Pt presents flat, depressed, and hopeless. Pt reports having constant SI today. She identifies financial stress as a major stressor. Pt reports that her family lacks the resources to provide assistance. Pt is currently unemployed. She is working on receiving her GED with the help of her mom. Pt reports that her son is her only motivation to live. A: Writer administered scheduled medications to pt, per MD orders. Writer spoke about the possible option of seeking temporary employment as a quick way to receive funding for necessities. Pt was encouraged to seek jobs located near public transportation routes if transportation was an issue.  Continued support and availability as needed was extended to this pt. Staff continue to monitor pt with q24min checks.  R: No adverse drug reactions noted. Pt receptive to treatment. Pt remains safe at this time.

## 2015-10-13 NOTE — Progress Notes (Signed)
Nutrition Education Note  Pt attended group focusing on general, healthful nutrition education.  RD emphasized the importance of eating regular meals and snacks throughout the day. Consuming sugar-free beverages and incorporating fruits and vegetables into diet when possible. Provided examples of healthy snacks. Patient encouraged to leave group with a goal to improve nutrition/healthy eating.   Diet Order:   Pt is also offered choice of unit snacks mid-morning and mid-afternoon.  Pt is eating as desired.   If additional nutrition issues arise, please consult RD.  [name], MS, RD, LDN Pager: [phone] After Hours Pager: [phone]

## 2015-10-13 NOTE — Progress Notes (Signed)
Pt reports no improvements in the intensity/occurrence of her nightmares. Pt verbalizes a plan to speak with the psychiatrist about her qhs medications.

## 2015-10-13 NOTE — Tx Team (Signed)
Interdisciplinary Treatment Plan Update (Adult) Date: 10/13/2015   Date: 10/13/2015 1:54 PM  Progress in Treatment:  Attending groups: Yes  Participating in groups: Yes  Taking medication as prescribed: Yes  Tolerating medication: Yes  Family/Significant othe contact made: Yes, with mother Patient understands diagnosis: Yes Discussing patient identified problems/goals with staff: Yes  Medical problems stabilized or resolved: Yes  Denies suicidal/homicidal ideation: Yes Patient has not harmed self or Others: Yes   New problem(s) identified: None identified at this time.   Discharge Plan or Barriers: Pt will return home and follow-up with [facility]  Additional comments: Pt reports that she is still experiencing AH secondary to past trauma  Reason for Continuation of Hospitalization:  Anxiety Depression Medication stabilization Suicidal ideation  Estimated length of stay: 3-5 days  Review of initial/current patient goals per problem list:   1.  Goal(s): Patient will participate in aftercare plan  Met:  Yes  Target date: 3-5 days from date of admission   As evidenced by: Patient will participate within aftercare plan AEB aftercare provider and housing plan at discharge being identified.   10/11/15: Pt will return home and follow-up with [facility]  2.  Goal (s): Patient will exhibit decreased depressive symptoms and suicidal ideations.  Met:  Yes  Target date: 3-5 days from date of admission   As evidenced by: Patient will utilize self rating of depression at 3 or below and demonstrate decreased signs of depression or be deemed stable for discharge by MD.  10/11/15: Pt rates depression at 3/10. Denies SI  3.  Goal(s): Patient will demonstrate decreased signs and symptoms of anxiety.  Met:  Yes  Target date: 3-5 days from date of admission   As evidenced by: Patient will utilize self rating of anxiety at 3 or below and demonstrated decreased signs of anxiety, or be  deemed stable for discharge by MD  10/11/15: Pt rates anxiety at 3/10.   Attendees:  Patient:    Family:    Physician: Dr. Parke Poisson, MD  10/13/2015 1:54 PM  Nursing: Lars Pinks, RN Case manager  10/13/2015 1:54 PM  Clinical Social Worker Norman Clay, MSW 10/13/2015 1:54 PM  Other: Lucinda Dell, [place] Sessions [place] 10/13/2015 1:54 PM  Clinical: Hedy Jacob, RN 10/13/2015 1:54 PM  Other: , RN Charge Nurse 10/13/2015 1:54 PM  Other:     Peri Maris, [place] MSW

## 2015-10-13 NOTE — Progress Notes (Signed)
DAR Note: Patient is calm and pleasant.  Denies pain and visual hallucination but reports voices telling her "not going to be around longer."  Patient contracts for safety.  Maintained on routine safety checks.  Medications given as prescribed.  Support and encouragement offered as needed.  Patient requested and received Vistaril for complain of anxiety with good effect.  Patient attended all group therapies and participated.  Observed socializing with peers in the dayroom.

## 2015-10-13 NOTE — Progress Notes (Signed)
Patient ID: Ruth Daniels, female   DOB: 1996-06-07, 19 y.o.   MRN: [id number] [facility] MD Progress Note  10/13/2015 5:40 PM Ashmi Blas  MRN:  [id number] Subjective:    Patient states she is starting to feel better . States she has not had any hallucinations today, and does not appear internally preoccupied . States her symptoms of depression have been improving and today " I feel better about myself ". She denies medication side effects. Of note, she reports concern that she may be pregnant- we reviewed- she is basing this on amenorrhea - states " I should have already have gotten my period ".  Pregnancy test done upon admission was negative . It is likely that amenorrhea may be associated with hyperprolactinemia, in turn associated with antipsychotic management. Of note, denies any galactorrhea.  We reviewed this with patient and she was much reassured, but still wanted to get a follow up pregnancy test " just to make sure ". Of note, we reviewed option of tapering down antipsychotic dose to minimize side effects- she states she does not want to taper down dose, because " it is helping and I am not hearing the voices any more ".    Objective : I  have discussed case with treatment team and have met with patient. No disruptive or agitated behaviors on unit. Visible on the unit, interacting appropriately with peers. She states she is feeling better and is hoping for discharge soon. Denies medication side effects- as noted, current amenorrhea - states she is several days late related to previous menstrual period-may be related to antipsychotic induced hyperprolactinemia. Slept better last night, still has some nightmares, but no further panic type symptoms at night .   Principal Problem: Schizoaffective disorder, depressive type ([place]) Diagnosis:   Patient Active Problem List   Diagnosis Date Noted  . Schizoaffective disorder, depressive type ([place]) [F25.1] 10/08/2015  . Cannabis use  disorder, severe, dependence ([place]) [F12.20] 10/08/2015  . Suicidal ideation [R45.851] 10/07/2015  . IUD check up [Z30.431] 04/01/2014  . PTSD (post-traumatic stress disorder) [F43.10] 02/19/2012   Total Time spent with patient:  25 minutes  Past Psychiatric History: PTSD, MDD, Suicidal attempts, Schizoaffective Disorder, cannabis use disorder  Past Medical History:  Past Medical History  Diagnosis Date  . Allergy     Penicillin  . Eczema   . Chlamydia trachomatis infection in pregnancy   . GBS (group B streptococcus) UTI complicating pregnancy 9/74/1638  . Endometriosis   . PID (acute pelvic inflammatory disease)     Past Surgical History  Procedure Laterality Date  . Foot surgery     Family History:  Family History  Problem Relation Age of Onset  . Depression Mother    Family Psychiatric  History: Unknown Social History: See HPI History  Alcohol Use No     History  Drug Use  . Yes  . Special: Marijuana    Social History   Social History  . Marital Status: Single    Spouse Name: N/A  . Number of Children: N/A  . Years of Education: N/A   Occupational History  . Student     12th grade- [employer]   Social History Main Topics  . Smoking status: Current Some Day Smoker -- 0.25 packs/day    Types: Cigarettes  . Smokeless tobacco: Never Used  . Alcohol Use: No  . Drug Use: Yes    Special: Marijuana  . Sexual Activity: Yes    Birth Control/ Protection: None  Other Topics Concern  . None   Social History Narrative   Additional Social History:    Sleep:  Improved - some nightmares   Appetite:   improving  Current Medications: Current Facility-Administered Medications  Medication Dose Route Frequency Provider Last Rate Last Dose  . acetaminophen (TYLENOL) tablet 650 mg  650 mg Oral Q6H PRN Encarnacion Slates, NP      . alum & mag hydroxide-simeth (MAALOX/MYLANTA) 200-200-20 MG/5ML suspension 30 mL  30 mL Oral Q4H PRN Encarnacion Slates, NP      .  benztropine (COGENTIN) tablet 0.5 mg  0.5 mg Oral BID PRN Jenne Campus, MD      . citalopram (CELEXA) tablet 20 mg  20 mg Oral Daily Jenne Campus, MD   20 mg at 10/13/15 0752  . diphenhydrAMINE (BENADRYL) tablet 25 mg  25 mg Oral Q6H PRN Encarnacion Slates, NP      . famotidine (PEPCID) tablet 20 mg  20 mg Oral BID Encarnacion Slates, NP   20 mg at 10/13/15 1704  . feeding supplement (ENSURE ENLIVE) (ENSURE ENLIVE) liquid 237 mL  237 mL Oral BID BM Myer Peer Cobos, MD   237 mL at 10/13/15 1448  . hydrOXYzine (ATARAX/VISTARIL) tablet 25 mg  25 mg Oral TID PRN Ursula Alert, MD   25 mg at 10/13/15 1450  . ibuprofen (ADVIL,MOTRIN) tablet 600 mg  600 mg Oral Q6H PRN Laverle Hobby, PA-C   600 mg at 10/11/15 2151  . Influenza vac split quadrivalent PF (FLUARIX) injection 0.5 mL  0.5 mL Intramuscular Tomorrow-1000 Myer Peer Cobos, MD   0.5 mL at 10/10/15 0830  . magnesium hydroxide (MILK OF MAGNESIA) suspension 30 mL  30 mL Oral Daily PRN Encarnacion Slates, NP      . nicotine polacrilex (NICORETTE) gum 2 mg  2 mg Oral PRN Jenne Campus, MD   2 mg at 10/08/15 0905  . ondansetron (ZOFRAN) tablet 4 mg  4 mg Oral Q8H PRN Harriet Butte, NP   4 mg at 10/07/15 2028  . prazosin (MINIPRESS) capsule 2 mg  2 mg Oral QHS Jenne Campus, MD   2 mg at 10/12/15 2114  . risperiDONE (RISPERDAL) tablet 3 mg  3 mg Oral QHS Jenne Campus, MD   3 mg at 10/12/15 2114  . traZODone (DESYREL) tablet 50 mg  50 mg Oral QHS PRN Encarnacion Slates, NP   50 mg at 10/07/15 2238  . triamcinolone cream (KENALOG) 0.1 %   Topical BID Nanci Pina, FNP        Lab Results:  No results found for this or any previous visit (from the past 48 hour(s)).  Physical Findings: AIMS: Facial and Oral Movements Muscles of Facial Expression: None, normal Lips and Perioral Area: None, normal Jaw: None, normal Tongue: None, normal,Extremity Movements Upper (arms, wrists, hands, fingers): None, normal Lower (legs, knees, ankles, toes): None,  normal, Trunk Movements Neck, shoulders, hips: None, normal, Overall Severity Severity of abnormal movements (highest score from questions above): None, normal Incapacitation due to abnormal movements: None, normal Patient's awareness of abnormal movements (rate only patient's report): No Awareness, Dental Status Current problems with teeth and/or dentures?: No Does patient usually wear dentures?: No  CIWA:  CIWA-Ar Total: 1 COWS:  COWS Total Score: 3  Musculoskeletal: Strength & Muscle Tone: within normal limits Gait & Station: normal Patient leans: N/A  Psychiatric Specialty Exam: Review of Systems  Skin: Positive for  itching and rash (hx of eczema).  Psychiatric/Behavioral: Positive for depression and suicidal ideas. Negative for hallucinations, memory loss and substance abuse. The patient is not nervous/anxious.   denies headache, denies chest pain, denies shortness of breath, no vomiting  Endorses amenorrhea, denies galactorrhea  Blood pressure 125/76, pulse 84, temperature 98.4 F (36.9 C), temperature source Oral, resp. rate 18, height _0  (1.549 m), weight 120 lb (54.432 kg), last menstrual period 09/11/2015, SpO2 100 %.Body mass index is 22.69 kg/(m^2).  General Appearance:  Improved grooming   Eye Contact::   Improved   Speech:  Normal Rate  Volume:  Normal  Mood:    Less depressed,   Affect:    More reactive, smiles at times appropriately , not constricted today, less anxious   Thought Process:  Linear  Orientation:  Full (Time, Place, and Person)  Thought Content:  At this time  States auditory hallucinations have resolved- does not appear internally preoccupied -no delusions   Suicidal Thoughts:  No at present denies any SI or self injurious ideations  Homicidal Thoughts:  No- specifically denies any current thoughts of HI or violence towards her child   Memory: recent and remote grossly intact   Judgement:  Other:  improving  Insight:  Fair and improving    Psychomotor Activity:  Normal  Concentration:  Good  Recall:  Good  Fund of Knowledge:Good  Language: Good  Akathisia:  No  Handed:  Right  AIMS (if indicated):     Assets:  Communication Skills Desire for Improvement Financial Resources/Insurance Leisure Time Physical Health Social Support  ADL's:  Intact  Cognition: WNL  Sleep:  Number of Hours: 6.75  Assessment -  Improved compared to yesterday- no further panic , acute anxiety symptoms, and states hallucinations have completely resolved. No psychotic symptoms noted at this time. She is tolerating medications well but has hyperprolactinemia, which may be causing amenorrhea. Patient 's pregnancy test was negative upon admission. She does not think she is pregnant , but wants to get  A follow up test for reassurance . As noted, she is starting to focus on being discharged soon. We have discussed medication side effects to include risk of movement disorder, metabolic disturbances, hyperprolactinemia - patient does not want to stop or even taper Risperidone at this time as she feels it is helping her significantly and reports resolution of auditory hallucinations on this medication. No akathisia.  Treatment Plan Summary: Daily contact with patient to assess and evaluate symptoms and progress in treatment and Medication management   Daily contact with patient to assess and evaluate symptoms and progress in treatment and Medication management  1 Continue inpatient admission for crisis management and stabilization.  2. Continue   Celexa 20  mgrs QDAY  For depression and for PTSD symptoms 3. Continue  Minipress2 mgrs QHS for PTSD related nightmares  4. Continue  Risperidone 3  mgr QHS for psychotic symptoms  5. Continue Trazodone 50 mgrs QHS PRN for insomnia as needed  5. Nicoderm patch to address cigarette cravings as needed  6. Encouraged to continue  Group/ milieu  Participation to work on coping skills and symptom reduction 7.  Continue Cogentin  0.5 mgrs BID PRN for dystonia/ EPS  if needed  8. Treatment team working on disposition planning 9. Request follow up pregnancy test   COBOS, FERNANDO 10/13/2015, 5:40 PM

## 2015-10-13 NOTE — BHH Group Notes (Signed)
[place] Group Notes:  (Nursing/MHT/Case Management/Adjunct)  Date:  10/13/2015  Time:  1000 Type of Therapy:  Nurse Education  Participation Level:  Active  Participation Quality:  Appropriate and Attentive  Affect:  Appropriate  Cognitive:  Alert and Appropriate  Insight:  Appropriate and Good  Engagement in Group:  Engaged  Modes of Intervention:  Discussion, Education and Exploration  Summary of Progress/Problems: Topic was on leisure and lifestyle changes.  Discussed the importance of engaging in a positive leisure activities .  Also discussed on power of positive thinking as it leads and help to boost self esteem.  Patient was attentive and participated.  States goal is 'to try to relax." Mart Piggs 10/13/2015, 11:15 AM

## 2015-10-13 NOTE — BHH Group Notes (Signed)
[facility] Association Group Therapy 10/13/2015 1:15pm  Type of Therapy: Mental Health Association Presentation  Participation Level: Minimal  Participation Quality: N/a  Affect: Lethargic  Cognitive: Oriented  Insight: Developing/Improving  Engagement in Therapy: Minimal  Modes of Intervention: Discussion, Education and Socialization  Summary of Progress/Problems: Mental Health Association ([place]) Speaker came to talk about his personal journey with substance abuse and addiction. The pt processed ways by which to relate to the speaker. [place] speaker provided handouts and educational information pertaining to groups and services offered by the [facility]. Pt slept through most of group discussion.   Peri Maris, [place] 10/13/2015 1:55 PM

## 2015-10-13 NOTE — Progress Notes (Signed)
Patient ID: Ruth Daniels, female   DOB: 12-May-1996, 19 y.o.   MRN: [id number] D: Client visible on the unit, seen on the phone and interacting with peers. Client reports "I'm going home tomorrow" denies SHI, notes that admission has been helpful in that she has gained coping skills. "like when someone bothers me, walk away, take deep breaths." "I also learned I need to express myself, instead of holding things in" A: Writer provided emotional support, encouraged client to follow through with therapy, and continue medications, reporting any concerns. Staff will monitor for safety q46min. R: Client is safe on the unit, attended karaoke and sang.

## 2015-10-14 MED ORDER — RISPERIDONE 3 MG PO TABS
3.0000 mg | ORAL_TABLET | Freq: Every day | ORAL | Status: DC
Start: 2015-10-14 — End: 2016-08-08

## 2015-10-14 MED ORDER — BENZTROPINE MESYLATE 0.5 MG PO TABS: 0.5000 mg | ORAL_TABLET | Freq: Two times a day (BID) | ORAL | Status: DC

## 2015-10-14 MED ORDER — CITALOPRAM HYDROBROMIDE 20 MG PO TABS: 20.0000 mg | ORAL_TABLET | Freq: Every day | ORAL | Status: DC

## 2015-10-14 MED ORDER — FAMOTIDINE 20 MG PO TABS: 20.0000 mg | ORAL_TABLET | Freq: Every day | ORAL | Status: DC

## 2015-10-14 MED ORDER — EPINEPHRINE 0.3 MG/0.3ML IJ SOAJ: 0.3000 mg | Freq: Once | INTRAMUSCULAR | Status: DC

## 2015-10-14 MED ORDER — TRAZODONE HCL 50 MG PO TABS: 50.0000 mg | ORAL_TABLET | Freq: Every evening | ORAL | Status: DC | PRN

## 2015-10-14 MED ORDER — HYDROXYZINE HCL 25 MG PO TABS: 25.0000 mg | ORAL_TABLET | Freq: Three times a day (TID) | ORAL | Status: DC | PRN

## 2015-10-14 MED ORDER — NICOTINE POLACRILEX 2 MG MT GUM: 2.0000 mg | CHEWING_GUM | OROMUCOSAL | Status: DC | PRN

## 2015-10-14 MED ORDER — TRIAMCINOLONE ACETONIDE 0.1 % EX CREA: TOPICAL_CREAM | Freq: Two times a day (BID) | CUTANEOUS | Status: DC

## 2015-10-14 MED ORDER — PRAZOSIN HCL 2 MG PO CAPS: 2.0000 mg | ORAL_CAPSULE | Freq: Every day | ORAL | Status: DC
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
## 2015-10-14 NOTE — Plan of Care (Signed)
Problem: Ineffective individual coping Goal: STG:Pt. will utilize relaxation techniques to reduce stress STG: Patient will utilize relaxation techniques to reduce stress levels  Outcome: Progressing Client noted 2 coping skills to help reduce stress "I will walk away, deep breathe" "talk to others and stop holding things in"

## 2015-10-14 NOTE — Progress Notes (Signed)
  [facility] Adult Case Management Discharge Plan :  Will you be returning to the same living situation after discharge:  Yes,  patient plans to return home At discharge, do you have transportation home?: Yes,  patient reports access to transportation Do you have the ability to pay for your medications: Yes,  patient will be provided with prescriptions at discharge  Release of information consent forms completed and in the chart;  Patient's signature needed at discharge.  Patient to Follow up at: Follow-up Information    Follow up with [facility].   Specialty:  Behavioral Health   Why:  Please walk-in between 8am-3pm Monday-Friday to be seen by a doctor and therapist. Please inform staff there that you are in need of a hospital discharge appointment. It is recommended that you arrive early to avoid long wait times   Contact information:   [place] [address] [phone]       Follow up with [facility] On 10/20/2015.   Why:  at 1:00pm for therapy with Illene Silver   Contact information:   [address], [address] [place], [place] [address] [phone]      Patient denies SI/HI: Yes,  denies    Safety Planning and Suicide Prevention discussed: Yes,  with patient and mother     Has patient been referred to the Quitline?: Patient refused referral  Odelle Kosier, Casimiro Needle 10/14/2015, 11:42 AM

## 2015-10-14 NOTE — BHH Suicide Risk Assessment (Signed)
[facility] Discharge Suicide Risk Assessment   Demographic Factors:  19 year old female, lives with her mother and her 47 year old son.  Total Time spent with patient: 30 minutes  Musculoskeletal: Strength & Muscle Tone: within normal limits Gait & Station: normal Patient leans: N/A  Psychiatric Specialty Exam: Physical Exam  ROS  Blood pressure 100/69, pulse 99, temperature 97.7 F (36.5 C), temperature source Oral, resp. rate 18, height 5\' 1"  (1.549 m), weight 120 lb (54.432 kg), last menstrual period 09/11/2015, SpO2 100 %.Body mass index is 22.69 kg/(m^2).  General Appearance: Well Groomed  Engineer, water::  Good  Speech:  Normal Rate409  Volume:  Normal  Mood:  Euthymic  Affect:  Appropriate and Full Range  Thought Process:  Linear  Orientation:  Full (Time, Place, and Person)  Thought Content:  denies current hallucinations, no delusions , does not appear internally preoccupied   Suicidal Thoughts:  No denies any self injurious or suicidal thoughts and also denies any thoughts of violence towards anyone else   Homicidal Thoughts:  No  Memory:  recent and remote grossly intact   Judgement:  Other:  improved   Insight:  improved   Psychomotor Activity:  Normal  Concentration:  Good  Recall:  Good  Fund of Knowledge:Good  Language: Good  Akathisia:  Negative  Handed:  Right  AIMS (if indicated):     Assets:  Communication Skills Desire for Improvement Physical Health Resilience  Sleep:  Number of Hours: 6.5  Cognition: WNL  ADL's:  Intact      Has this patient used any form of tobacco in the last 30 days? (Cigarettes, Smokeless Tobacco, Cigars, and/or Pipes)  We discussed benefits of smoking cessation , patient plans to work on smoking cessation , plans to use nicoderm patches .  Mental Status Per Nursing Assessment::   On Admission:     Current Mental Status by Physician: At this time patient is significantly improved compared to admission- she presents well groomed,  good eye contact, mood is now euthymic, not depressed at this time, affect is more reactive , no thought disorder, hallucinations have resolved and does not appear internally preoccupied or psychotic, no delusions, no SI, no HI, looking forward to reuniting with her mother and child  Loss Factors: Recent break up with BF , single mother with limited social support   Historical Factors: One prior admission related to depression as an adolescent, history of PTSD and of depression,   Risk Reduction Factors:   Responsible for children under 34 years of age, Sense of responsibility to family, Living with another person, especially a relative, Positive social support and Positive coping skills or problem solving skills  Continued Clinical Symptoms:  As noted at this time much improved compared to admission  Cognitive Features That Contribute To Risk:  No gross cognitive deficits noted upon discharge. Is alert , attentive, and oriented x 3    Suicide Risk:  Mild:  Suicidal ideation of limited frequency, intensity, duration, and specificity.  There are no identifiable plans, no associated intent, mild dysphoria and related symptoms, good self-control (both objective and subjective assessment), few other risk factors, and identifiable protective factors, including available and accessible social support.  Principal Problem: Schizoaffective disorder, depressive type [facility]) Discharge Diagnoses:  Patient Active Problem List   Diagnosis Date Noted  . Schizoaffective disorder, depressive type ([place]) [F25.1] 10/08/2015  . Cannabis use disorder, severe, dependence ([place]) [F12.20] 10/08/2015  . Suicidal ideation [R45.851] 10/07/2015  . IUD check  up [Z30.431] 04/01/2014  . PTSD (post-traumatic stress disorder) [F43.10] 02/19/2012    Follow-up Information    Follow up with [facility].   Specialty:  Behavioral Health   Why:  Please walk-in between 8am-3pm Monday-Friday to be seen by a doctor and therapist.  Please inform staff there that you are in need of a hospital discharge appointment. It is recommended that you arrive early to avoid long wait times   Contact information:   [place] [address] [phone]       Follow up with [facility] On 10/20/2015.   Why:  at 1:00pm for therapy with Illene Silver   Contact information:   [address], [place] and [place],  [address] [phone]      Plan Of Care/Follow-up recommendations:  Activity:  as tolerated Diet:  regular Tests:  NA Other:  see below  Is patient on multiple antipsychotic therapies at discharge:  No   Has Patient had three or more failed trials of antipsychotic monotherapy by history:  No  Recommended Plan for Multiple Antipsychotic Therapies: NA  Patient is leaving unit in good spirits. Plans to return home. Follow up as above .  Ruth Daniels 10/14/2015, 10:43 AM

## 2015-10-14 NOTE — Progress Notes (Signed)
Patient discharged home with prescription. Patient was stable and appreciative at that time. All papers and prescriptions were given and valuables returned. Verbal understanding expressed. Denies SI/HI and A/VH. Patient given opportunity to express concerns and ask questions.

## 2015-10-14 NOTE — Plan of Care (Signed)
Problem: Alteration in mood Goal: LTG-Pt's behavior demonstrates decreased signs of depression (Patient's behavior demonstrates decreased signs of depression to the point the patient is safe to return home and continue treatment in an outpatient setting)  Outcome: Progressing Client behavior demonstrates decreased signs of depression AEB interacting appropriately with peers, laughing and sharing, no more crying noted. Client denies depression, has plans in place to see therapist upon discharge.

## 2015-10-14 NOTE — BHH Group Notes (Signed)
[facility] LCSW Aftercare Discharge Planning Group Note  10/14/2015  8:45 AM  Participation Quality: Did Not Attend. Patient invited to participate but declined.  Tilden Fossa, MSW, [place] Worker [facility] [phone]

## 2015-11-30 ENCOUNTER — Ambulatory Visit (INDEPENDENT_AMBULATORY_CARE_PROVIDER_SITE_OTHER): Payer: Medicaid Other | Admitting: Physician Assistant

## 2015-11-30 ENCOUNTER — Other Ambulatory Visit (HOSPITAL_COMMUNITY)
Admission: RE | Admit: 2015-11-30 | Discharge: 2015-11-30 | Disposition: A | Discharge status: Home / Self Care | Payer: Medicaid Other | Source: Ambulatory Visit | Attending: Physician Assistant | Admitting: Physician Assistant

## 2015-11-30 ENCOUNTER — Encounter: Payer: Self-pay | Admitting: Physician Assistant

## 2015-11-30 VITALS — BP 114/77 | HR 73 | Temp 98.9°F | Wt 119.6 lb

## 2015-11-30 DIAGNOSIS — N760 Acute vaginitis: Secondary | ICD-10-CM

## 2015-11-30 DIAGNOSIS — R102 Pelvic and perineal pain: Secondary | ICD-10-CM | POA: Diagnosis not present

## 2015-11-30 DIAGNOSIS — B9689 Other specified bacterial agents as the cause of diseases classified elsewhere: Secondary | ICD-10-CM

## 2015-11-30 DIAGNOSIS — Z113 Encounter for screening for infections with a predominantly sexual mode of transmission: Secondary | ICD-10-CM | POA: Insufficient documentation

## 2015-11-30 DIAGNOSIS — A499 Bacterial infection, unspecified: Secondary | ICD-10-CM

## 2015-11-30 LAB — WET PREP, GENITAL: YEAST WET PREP: NONE SEEN

## 2015-11-30 MED ORDER — METRONIDAZOLE 500 MG PO TABS: 500.0000 mg | ORAL_TABLET | Freq: Two times a day (BID) | ORAL | Status: DC

## 2015-11-30 NOTE — Progress Notes (Signed)
Patient ID: Ruth Daniels, female   DOB: 05/25/1996, 19 y.o.   MRN: [id number] History:  Ruth Daniels is a 19 y.o. G1P1001 who presents to clinic today for pelvic pain.  She was initially scheduled as a follow up for PID although her discharge summary from [facility] does not say PID, rather it says Pelvic Congestion Syndrome.  She was given Ibuprofen, pain med and told to come here.  I reviewed her discharge summary which gave lab results for urine and blood testing but no STD testing and NO results from CT.   Patient questioning the difference in PID and pelvic congestions syndrome.  When informed that PID is often related to a sexually transmitted disease, she insists she could not possibly have an STD and does not need testing for this.   Her pain has been ongoing for the last 2 years.  It occasionally dissipates for a short time but always comes back.  It is 3-4/10 presently but gets 10/10.  It is more left lower quadrant but occasionally left.  Her periods are irregular.  She is desiring pregnancy again and therefore not using contraception.  She reports nausea, vomiting every few days for the last couple months, but denies fever, change in appetite, weakness, vaginal discharge/odor/irritation.   Her partner is concerned that she has not yet become pregnant.    The following portions of the patient's history were reviewed and updated as appropriate: allergies, current medications, past family history, past medical history, past social history, past surgical history and problem list.  Review of Systems:  Pertinent items noted in HPI and remainder of comprehensive ROS otherwise negative.  Objective:  Physical Exam BP 114/77 mmHg  Pulse 73  Temp(Src) 98.9 F (37.2 C) (Oral)  Wt 119 lb 9.6 oz (54.25 kg)  LMP 11/14/2015 GENERAL: Well-developed, well-nourished female in no acute distress.  HEENT: Normocephalic, atraumatic.  RESPIRATORY: Normal rate. Clear to auscultation  bilaterally.  CARDIOVASCULAR: Regular rate and rhythm with no adventitious sounds.  ABDOMEN: Soft, nontender, nondistended. No organomegaly. Normal bowel sounds appreciated in all quadrants.  PELVIC: Normal external female genitalia. Vagina is pink and rugated.  Gross amount of tan colored, thin, frothy discharge.   Normal cervix contour.  Uterus is normal in size. No adnexal mass or tenderness.  EXTREMITIES: No cyanosis, clubbing, or edema, 2+ distal pulses.   Labs and Imaging No results found.  Assessment & Plan:  Assessment: 1. Bacterial vaginosis   2. Pelvic pain in female      Plans: Records from ED visit on 11/8 including pelvic CT are not available. ROI to be signed and records obtained.  Pt was agreeable to vaginal testing today and will come back for eval after we have her records and lab results from today.  Based on the frothy discharge present, I have treated with Flagyl.  Trichomonas is a distinct possibility but pt unwilling to consider.  Will await lab results prior to diagnosing this.    Paticia Stack, PA-C 11/30/2015 2:56 PM

## 2015-11-30 NOTE — Patient Instructions (Signed)

## 2015-12-01 LAB — GC/CHLAMYDIA PROBE AMP (~~LOC~~) NOT AT ARMC
Chlamydia: NEGATIVE
Neisseria Gonorrhea: NEGATIVE

## 2015-12-02 ENCOUNTER — Encounter: Payer: Self-pay | Admitting: *Deleted

## 2015-12-05 ENCOUNTER — Telehealth: Payer: Self-pay | Admitting: General Practice

## 2015-12-05 NOTE — Telephone Encounter (Signed)
Per Santiago Glad, Pt with Trichomonas on wet prep as well as Bacterial Vaginosis. She was treated appropriately with 1 week of Flagyl however she needs to be informed of presence of STD. All partners should be treated. If pt has continued to be sexually active, she may need to be re-treated as well. Called patient and message stated this person is unavailable right now.

## 2015-12-06 NOTE — Telephone Encounter (Signed)
Called pt and heard message stating the person called is unavailable - unable to leave message.

## 2015-12-07 ENCOUNTER — Encounter: Payer: Self-pay | Admitting: General Practice

## 2015-12-07 NOTE — Telephone Encounter (Signed)
Called patient and message went straight to voicemail stating this person is unavailable right now. Will send letter

## 2016-05-10 ENCOUNTER — Encounter (HOSPITAL_COMMUNITY): Payer: Self-pay

## 2016-05-10 ENCOUNTER — Emergency Department (HOSPITAL_COMMUNITY): Payer: Medicaid Other

## 2016-05-10 ENCOUNTER — Emergency Department (HOSPITAL_COMMUNITY)
Admission: EM | Admit: 2016-05-10 | Discharge: 2016-05-10 | Disposition: A | Discharge status: Home / Self Care | Payer: Medicaid Other | Attending: Emergency Medicine | Admitting: Emergency Medicine

## 2016-05-10 DIAGNOSIS — F17210 Nicotine dependence, cigarettes, uncomplicated: Secondary | ICD-10-CM | POA: Insufficient documentation

## 2016-05-10 DIAGNOSIS — R072 Precordial pain: Secondary | ICD-10-CM | POA: Diagnosis present

## 2016-05-10 DIAGNOSIS — M546 Pain in thoracic spine: Secondary | ICD-10-CM | POA: Insufficient documentation

## 2016-05-10 DIAGNOSIS — R0789 Other chest pain: Secondary | ICD-10-CM | POA: Diagnosis not present

## 2016-05-10 DIAGNOSIS — Z7982 Long term (current) use of aspirin: Secondary | ICD-10-CM | POA: Insufficient documentation

## 2016-05-10 DIAGNOSIS — R55 Syncope and collapse: Secondary | ICD-10-CM | POA: Diagnosis not present

## 2016-05-10 LAB — CBC WITH DIFFERENTIAL/PLATELET
Basophils Absolute: 0 10*3/uL (ref 0.0–0.1)
Basophils Relative: 0 %
Eosinophils Absolute: 0.1 10*3/uL (ref 0.0–0.7)
Eosinophils Relative: 1 %
HEMATOCRIT: 35.1 % — AB (ref 36.0–46.0)
Hemoglobin: 12 g/dL (ref 12.0–15.0)
LYMPHS ABS: 2.5 10*3/uL (ref 0.7–4.0)
LYMPHS PCT: 33 %
MCH: 31.4 pg (ref 26.0–34.0)
MCHC: 34.2 g/dL (ref 30.0–36.0)
MCV: 91.9 fL (ref 78.0–100.0)
MONO ABS: 0.4 10*3/uL (ref 0.1–1.0)
Monocytes Relative: 5 %
NEUTROS ABS: 4.7 10*3/uL (ref 1.7–7.7)
Neutrophils Relative %: 61 %
Platelets: 201 10*3/uL (ref 150–400)
RBC: 3.82 MIL/uL — ABNORMAL LOW (ref 3.87–5.11)
RDW: 12.5 % (ref 11.5–15.5)
WBC: 7.7 10*3/uL (ref 4.0–10.5)

## 2016-05-10 LAB — URINALYSIS, ROUTINE W REFLEX MICROSCOPIC
BILIRUBIN URINE: NEGATIVE
Glucose, UA: NEGATIVE mg/dL
HGB URINE DIPSTICK: NEGATIVE
Ketones, ur: NEGATIVE mg/dL
Leukocytes, UA: NEGATIVE
Nitrite: NEGATIVE
PH: 7 (ref 5.0–8.0)
Protein, ur: 30 mg/dL — AB
SPECIFIC GRAVITY, URINE: 1.015 (ref 1.005–1.030)

## 2016-05-10 LAB — BASIC METABOLIC PANEL
Anion gap: 7 (ref 5–15)
BUN: 13 mg/dL (ref 6–20)
CO2: 26 mmol/L (ref 22–32)
Calcium: 8.7 mg/dL — ABNORMAL LOW (ref 8.9–10.3)
Chloride: 108 mmol/L (ref 101–111)
Creatinine, Ser: 0.64 mg/dL (ref 0.44–1.00)
GFR calc Af Amer: >60 mL/min (ref >60)
GLUCOSE: 87 mg/dL (ref 65–99)
POTASSIUM: 3.7 mmol/L (ref 3.5–5.1)
Sodium: 141 mmol/L (ref 135–145)

## 2016-05-10 LAB — URINE MICROSCOPIC-ADD ON: RBC / HPF: NONE SEEN RBC/hpf (ref 0–5)

## 2016-05-10 LAB — TROPONIN I: Troponin I: <0.03 ng/mL (ref <0.031)

## 2016-05-10 LAB — PREGNANCY, URINE: Preg Test, Ur: NEGATIVE

## 2016-05-10 MED ORDER — NAPROXEN 250 MG PO TABS: 250.0000 mg | ORAL_TABLET | Freq: Two times a day (BID) | ORAL | Status: DC | PRN

## 2016-05-10 MED ORDER — METHOCARBAMOL 500 MG PO TABS: 1000.0000 mg | ORAL_TABLET | Freq: Four times a day (QID) | ORAL | Status: DC | PRN

## 2016-05-10 MED ORDER — IOPAMIDOL (ISOVUE-370) INJECTION 76%
100.0000 mL | Freq: Once | INTRAVENOUS | Status: AC | PRN
  Administered 2016-05-10: 100 mL via INTRAVENOUS

## 2016-05-10 MED ORDER — SODIUM CHLORIDE 0.9 % IV SOLN: INTRAVENOUS | Status: DC

## 2016-05-10 NOTE — ED Notes (Signed)
Complain of left sided chest pain that radiates to the back. Also, complain of tingling to fingers to left hand.

## 2016-05-10 NOTE — ED Provider Notes (Signed)
CSN: [id number]     Arrival date & time 05/10/16  1247 History   First MD Initiated Contact with Patient 05/10/16 1410     Chief Complaint  Patient presents with  . Chest Pain     HPI  Pt was seen at 1415. Per pt, c/o gradual onset and persistence of constant mid-sternal chest "pain" for the past 3 days. States the pain is "sharp" and radiates into her mid and then down into her lower back. States today she was laying on her bed and her boyfriend "found her passed out." Pt's boyfriend states he was unable to awaken her, but pt awakened to EMS calling her name loudly. No confusion, no seizure activity, no incont of bowel or bladder. Denies palpitations, no SOB/cough, no abd pain, no N/V/D, no fevers, no rash.   Past Medical History  Diagnosis Date  . Allergy     Penicillin  . Eczema   . Chlamydia trachomatis infection in pregnancy   . GBS (group B streptococcus) UTI complicating pregnancy [id number]  . Endometriosis   . PID (acute pelvic inflammatory disease)    Past Surgical History  Procedure Laterality Date  . Foot surgery     Family History  Problem Relation Age of Onset  . Depression Mother    Social History  Substance Use Topics  . Smoking status: Current Some Day Smoker -- 0.25 packs/day    Types: Cigarettes  . Smokeless tobacco: Never Used  . Alcohol Use: No   OB History    Gravida Para Term Preterm AB TAB SAB Ectopic Multiple Living   1 1 1       1      Review of Systems ROS: Statement: All systems negative except as marked or noted in the HPI; Constitutional: Negative for fever and chills. ; ; Eyes: Negative for eye pain, redness and discharge. ; ; ENMT: Negative for ear pain, hoarseness, nasal congestion, sinus pressure and sore throat. ; ; Cardiovascular: +CP. Negative for palpitations, diaphoresis, dyspnea and peripheral edema. ; ; Respiratory: Negative for cough, wheezing and stridor. ; ; Gastrointestinal: Negative for nausea, vomiting, diarrhea, abdominal pain,  blood in stool, hematemesis, jaundice and rectal bleeding. . ; ; Genitourinary: Negative for dysuria, flank pain and hematuria. ; ; Musculoskeletal: Negative for back pain and neck pain. Negative for swelling and trauma.; ; Skin: Negative for pruritus, rash, abrasions, blisters, bruising and skin lesion.; ; Neuro: Negative for headache, lightheadedness and neck stiffness. Negative for weakness, extremity weakness, paresthesias, involuntary movement, seizure and +syncope.      Allergies  Bee venom; Latex; Mushroom extract complex; and Penicillins  Home Medications   Prior to Admission medications   Medication Sig Start Date End Date Taking? Authorizing Provider  aspirin 81 MG tablet Take 81 mg by mouth daily.   Yes Historical Provider, MD  naproxen sodium (ANAPROX) 220 MG tablet Take 440 mg by mouth 2 (two) times daily with a meal.   Yes Historical Provider, MD  benztropine (COGENTIN) 0.5 MG tablet Take 1 tablet (0.5 mg total) by mouth 2 (two) times daily. Patient not taking: Reported on 05/10/2016 10/14/15   Benjamine Mola, FNP  citalopram (CELEXA) 20 MG tablet Take 1 tablet (20 mg total) by mouth daily. Patient not taking: Reported on 05/10/2016 10/14/15   Benjamine Mola, FNP  EPINEPHrine 0.3 mg/0.3 mL IJ SOAJ injection Inject 0.3 mLs (0.3 mg total) into the muscle once. 10/14/15   Benjamine Mola, FNP  famotidine (PEPCID) 20 MG tablet  Take 1 tablet (20 mg total) by mouth daily. Patient not taking: Reported on 05/10/2016 10/14/15   Benjamine Mola, FNP  hydrOXYzine (ATARAX/VISTARIL) 25 MG tablet Take 1 tablet (25 mg total) by mouth 3 (three) times daily as needed for anxiety. Patient not taking: Reported on 05/10/2016 10/14/15   Benjamine Mola, FNP  metroNIDAZOLE (FLAGYL) 500 MG tablet Take 1 tablet (500 mg total) by mouth 2 (two) times daily. Patient not taking: Reported on 05/10/2016 11/30/15   Paticia Stack, PA-C  nicotine polacrilex (NICORETTE) 2 MG gum Take 1 each (2 mg total) by mouth  as needed for smoking cessation. Patient not taking: Reported on 11/30/2015 10/14/15   Benjamine Mola, FNP  prazosin (MINIPRESS) 2 MG capsule Take 1 capsule (2 mg total) by mouth at bedtime. Patient not taking: Reported on 05/10/2016 10/14/15   Benjamine Mola, FNP  risperiDONE (RISPERDAL) 3 MG tablet Take 1 tablet (3 mg total) by mouth at bedtime. Patient not taking: Reported on 05/10/2016 10/14/15   Benjamine Mola, FNP  traZODone (DESYREL) 50 MG tablet Take 1 tablet (50 mg total) by mouth at bedtime as needed for sleep. Patient not taking: Reported on 05/10/2016 10/14/15   Benjamine Mola, FNP  triamcinolone cream (KENALOG) 0.1 % Apply topically 2 (two) times daily. To affected areas Patient not taking: Reported on 05/10/2016 10/14/15   Elyse Jarvis Withrow, FNP   BP 137/74 mmHg  Pulse 70  Temp(Src) 98.7 F (37.1 C) (Oral)  Resp 16  Ht 5\' 2"  (1.575 m)  Wt 115 lb (52.164 kg)  BMI 21.03 kg/m2  SpO2 100% Physical Exam  1420: Physical examination:  Nursing notes reviewed; Vital signs and O2 SAT reviewed;  Constitutional: Well developed, Well nourished, Well hydrated, In no acute distress; Head:  Normocephalic, atraumatic; Eyes: EOMI, PERRL, No scleral icterus; ENMT: Mouth and pharynx normal, Mucous membranes moist; Neck: Supple, Full range of motion, No lymphadenopathy; Cardiovascular: Regular rate and rhythm, No murmur, rub, or gallop; Respiratory: Breath sounds clear & equal bilaterally, No rales, rhonchi, wheezes.  Speaking full sentences with ease, Normal respiratory effort/excursion; Chest: +left anterior chest wall tender to palp. No deformity, no soft tissue crepitus. Movement normal; Abdomen: Soft, Nontender, Nondistended, Normal bowel sounds; Genitourinary: No CVA tenderness; Spine:  No midline CS, TS, LS tenderness.;; Extremities: Pulses normal, No tenderness, No edema, No calf edema or asymmetry.; Neuro: AA&Ox3, Major CN grossly intact.  Speech clear. No gross focal motor or sensory deficits in  extremities. Climbs on and off stretcher easily by herself. Gait steady.; Skin: Color normal, Warm, Dry.   ED Course  Procedures (including critical care time) Labs Review   Imaging Review  I have personally reviewed and evaluated these images and lab results as part of my medical decision-making.   EKG Interpretation   Date/Time:  Thursday May 10 2016 13:06:34 EDT Ventricular Rate:  53 PR Interval:  134 QRS Duration: 76 QT Interval:  404 QTC Calculation: 379 R Axis:   82 Text Interpretation:  Sinus bradycardia Cannot rule out Anterior infarct ,  age undetermined When compared with ECG of 02/08/2012 No significant change  was found Confirmed by [facility]  MD, Nunzio Cory [phone]) on 05/10/2016 3:14:17  PM      MDM  MDM Reviewed: previous chart, nursing note and vitals Reviewed previous: labs, ECG, CT scan, x-ray and MRI Interpretation: labs, ECG, x-ray and CT scan     Results for orders placed or performed during the hospital encounter of 05/10/16  Basic metabolic panel  Result Value Ref Range   Sodium 141 135 - 145 mmol/L   Potassium 3.7 3.5 - 5.1 mmol/L   Chloride 108 101 - 111 mmol/L   CO2 26 22 - 32 mmol/L   Glucose, Bld 87 65 - 99 mg/dL   BUN 13 6 - 20 mg/dL   Creatinine, Ser 0.64 0.44 - 1.00 mg/dL   Calcium 8.7 (L) 8.9 - 10.3 mg/dL   GFR calc non Af Amer >60 >60 mL/min   GFR calc Af Amer >60 >60 mL/min   Anion gap 7 5 - 15  Troponin I  Result Value Ref Range   Troponin I <0.03 <0.031 ng/mL  CBC with Differential  Result Value Ref Range   WBC 7.7 4.0 - 10.5 K/uL   RBC 3.82 (L) 3.87 - 5.11 MIL/uL   Hemoglobin 12.0 12.0 - 15.0 g/dL   HCT 35.1 (L) 36.0 - 46.0 %   MCV 91.9 78.0 - 100.0 fL   MCH 31.4 26.0 - 34.0 pg   MCHC 34.2 30.0 - 36.0 g/dL   RDW 12.5 11.5 - 15.5 %   Platelets 201 150 - 400 K/uL   Neutrophils Relative % 61 %   Neutro Abs 4.7 1.7 - 7.7 K/uL   Lymphocytes Relative 33 %   Lymphs Abs 2.5 0.7 - 4.0 K/uL   Monocytes Relative 5 %   Monocytes  Absolute 0.4 0.1 - 1.0 K/uL   Eosinophils Relative 1 %   Eosinophils Absolute 0.1 0.0 - 0.7 K/uL   Basophils Relative 0 %   Basophils Absolute 0.0 0.0 - 0.1 K/uL  Pregnancy, urine  Result Value Ref Range   Preg Test, Ur NEGATIVE NEGATIVE  Urinalysis, Routine w reflex microscopic  Result Value Ref Range   Color, Urine YELLOW YELLOW   APPearance CLEAR CLEAR   Specific Gravity, Urine 1.015 1.005 - 1.030   pH 7.0 5.0 - 8.0   Glucose, UA NEGATIVE NEGATIVE mg/dL   Hgb urine dipstick NEGATIVE NEGATIVE   Bilirubin Urine NEGATIVE NEGATIVE   Ketones, ur NEGATIVE NEGATIVE mg/dL   Protein, ur 30 (A) NEGATIVE mg/dL   Nitrite NEGATIVE NEGATIVE   Leukocytes, UA NEGATIVE NEGATIVE  Urine microscopic-add on  Result Value Ref Range   Squamous Epithelial / LPF 0-5 (A) NONE SEEN   WBC, UA 0-5 0 - 5 WBC/hpf   RBC / HPF NONE SEEN 0 - 5 RBC/hpf   Bacteria, UA RARE (A) NONE SEEN   Urine-Other MUCOUS PRESENT    Ct Head Wo Contrast 05/10/2016  CLINICAL DATA:  Headache. EXAM: CT HEAD WITHOUT CONTRAST TECHNIQUE: Contiguous axial images were obtained from the base of the skull through the vertex without intravenous contrast. COMPARISON:  None. FINDINGS: Bony calvarium appears intact. No mass effect or midline shift is noted. Ventricular size is within normal limits. There is no evidence of mass lesion, hemorrhage or acute infarction. IMPRESSION: Normal head CT. Electronically Signed   By: Marijo Conception, M.D.   On: 05/10/2016 16:07   Ct Angio Chest Pe W/cm &/or Wo Cm 05/10/2016  CLINICAL DATA:  Left-sided chest pain radiating to the back. EXAM: CT ANGIOGRAPHY CHEST CT ABDOMEN AND PELVIS WITH CONTRAST TECHNIQUE: Multidetector CT imaging of the chest was performed using the standard protocol during bolus administration of intravenous contrast. Multiplanar CT image reconstructions and MIPs were obtained to evaluate the vascular anatomy. Multidetector CT imaging of the abdomen and pelvis was performed using the  standard protocol during bolus administration of intravenous  contrast. CONTRAST:  100 cc Isovue 370 intravenous COMPARISON:  Abdominal CT 10/15/2014 FINDINGS: CTA CHEST FINDINGS THORACIC INLET/BODY WALL: No acute abnormality. MEDIASTINUM: Normal heart size. No pericardial effusion. No acute vascular abnormality. No adenopathy. LUNG WINDOWS: There is no edema, consolidation, effusion, or pneumothorax. OSSEOUS: No acute fracture.  No suspicious lytic or blastic lesions. CT ABDOMEN and PELVIS FINDINGS Lower chest and abdominal wall:  No contributory findings. Hepatobiliary: Abnormal liver perfusion with heterogeneous appearance. This finding is often seen with passive congestion, but the right heart appears normal. The peripheral hepatic veins do not opacify in distinction to the central hepatic veins and hepatic cava. The portal venous system is patent. There are no signs of cirrhosis. This is considered a chronic process. Although the hepatic venous system was not opacified on the previous study, parenchymal heterogeneous perfusion was seen at that time. No evidence of biliary obstruction or stone. Pancreas: Unremarkable. Spleen: Unremarkable. Adrenals/Urinary Tract: Negative adrenals. No hydronephrosis or stone. Unremarkable bladder. Reproductive:Large and refluxing left ovarian vein. Varix formation along the right ovarian vein. 3 cm right ovarian cyst. Prominent peripheral enhancement may reflect an underlying corpus luteum. Stomach/Bowel:  No obstruction. No appendicitis. Vascular/Lymphatic: No acute vascular abnormality. No mass or adenopathy. Peritoneal: No ascites or pneumoperitoneum. Musculoskeletal: No acute abnormalities. Review of the MIP images confirms the above findings. IMPRESSION: 1. No acute intrathoracic finding.  Negative for pulmonary embolism. 2. 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 Recommend workup for Budd-Chiari. The non-opacified veins are  peripheral and would be challenging for Doppler. Suggest multi phasic liver MRI with contrast. Electronically Signed   By: Monte Fantasia M.D.   On: 05/10/2016 16:24   Ct Abdomen Pelvis W Contrast 05/10/2016  CLINICAL DATA:  Left-sided chest pain radiating to the back. EXAM: CT ANGIOGRAPHY CHEST CT ABDOMEN AND PELVIS WITH CONTRAST TECHNIQUE: Multidetector CT imaging of the chest was performed using the standard protocol during bolus administration of intravenous contrast. Multiplanar CT image reconstructions and MIPs were obtained to evaluate the vascular anatomy. Multidetector CT imaging of the abdomen and pelvis was performed using the standard protocol during bolus administration of intravenous contrast. CONTRAST:  100 cc Isovue 370 intravenous COMPARISON:  Abdominal CT 10/15/2014 FINDINGS: CTA CHEST FINDINGS THORACIC INLET/BODY WALL: No acute abnormality. MEDIASTINUM: Normal heart size. No pericardial effusion. No acute vascular abnormality. No adenopathy. LUNG WINDOWS: There is no edema, consolidation, effusion, or pneumothorax. OSSEOUS: No acute fracture.  No suspicious lytic or blastic lesions. CT ABDOMEN and PELVIS FINDINGS Lower chest and abdominal wall:  No contributory findings. Hepatobiliary: Abnormal liver perfusion with heterogeneous appearance. This finding is often seen with passive congestion, but the right heart appears normal. The peripheral hepatic veins do not opacify in distinction to the central hepatic veins and hepatic cava. The portal venous system is patent. There are no signs of cirrhosis. This is considered a chronic process. Although the hepatic venous system was not opacified on the previous study, parenchymal heterogeneous perfusion was seen at that time. No evidence of biliary obstruction or stone. Pancreas: Unremarkable. Spleen: Unremarkable. Adrenals/Urinary Tract: Negative adrenals. No hydronephrosis or stone. Unremarkable bladder. Reproductive:Large and refluxing left ovarian  vein. Varix formation along the right ovarian vein. 3 cm right ovarian cyst. Prominent peripheral enhancement may reflect an underlying corpus luteum. Stomach/Bowel:  No obstruction. No appendicitis. Vascular/Lymphatic: No acute vascular abnormality. No mass or adenopathy. Peritoneal: No ascites or pneumoperitoneum. Musculoskeletal: No acute abnormalities. Review of the MIP images confirms the above findings. IMPRESSION: 1.  No acute intrathoracic finding.  Negative for pulmonary embolism. 2. 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 Recommend workup for Budd-Chiari. The non-opacified veins are peripheral and would be challenging for Doppler. Suggest multi phasic liver MRI with contrast. Electronically Signed   By: Monte Fantasia M.D.   On: 05/10/2016 16:24    1650:  Liver findings have been present since 2015 CT scan; pt has not f/u as outpt. Pt encouraged to f/u with GI. Workup otherwise reassuring. Tx symptomatically, f/u PMD. Dx and testing d/w pt and friend.  Questions answered.  Verb understanding, agreeable to d/c home with outpt f/u.   Francine Graven, DO 05/14/16 [phone]

## 2016-05-10 NOTE — ED Notes (Signed)
Pt was given asa 324 mg by EMS, prior to arrival

## 2016-05-10 NOTE — Discharge Instructions (Signed)
Take the prescriptions as directed.  Apply moist heat or ice to the area(s) of discomfort, for 15 minutes at a time, several times per day for the next few days.  Do not fall asleep on a heating or ice pack. 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 Suggest multi phasic liver MRI with contrast."  Call your regular medical doctor and the GI doctor tomrrow to schedule a follow up appointment this week for this finding.  Return to the Emergency Department immediately if worsening.

## 2016-05-11 ENCOUNTER — Telehealth: Payer: Self-pay | Admitting: Nurse Practitioner

## 2016-05-11 NOTE — Telephone Encounter (Signed)
Pt called to say that she was seen in the ED on 5/11 and was told to contact us to make OV.Please advise if she can be approved as a new patient. [phone]

## 2016-05-14 ENCOUNTER — Encounter: Payer: Self-pay | Admitting: Gastroenterology

## 2016-05-14 NOTE — Telephone Encounter (Signed)
OV made and letter mailed °

## 2016-05-14 NOTE — Telephone Encounter (Signed)
No previous GI in the system, ok to schedule with a provider

## 2016-06-04 ENCOUNTER — Encounter: Payer: Self-pay | Admitting: Nurse Practitioner

## 2016-06-04 ENCOUNTER — Ambulatory Visit: Payer: Self-pay | Admitting: Nurse Practitioner

## 2016-06-04 ENCOUNTER — Telehealth: Payer: Self-pay | Admitting: Nurse Practitioner

## 2016-06-04 NOTE — Telephone Encounter (Signed)
PATIENT WAS A NO SHOW AND LETTER SENT  °

## 2016-06-05 NOTE — Telephone Encounter (Signed)
Noted  

## 2016-08-08 ENCOUNTER — Other Ambulatory Visit: Payer: Self-pay

## 2016-08-08 ENCOUNTER — Encounter: Payer: Self-pay | Admitting: Nurse Practitioner

## 2016-08-08 ENCOUNTER — Ambulatory Visit (INDEPENDENT_AMBULATORY_CARE_PROVIDER_SITE_OTHER): Payer: Medicaid Other | Admitting: Nurse Practitioner

## 2016-08-08 DIAGNOSIS — R932 Abnormal findings on diagnostic imaging of liver and biliary tract: Secondary | ICD-10-CM

## 2016-08-08 DIAGNOSIS — R9389 Abnormal findings on diagnostic imaging of other specified body structures: Secondary | ICD-10-CM

## 2016-08-08 DIAGNOSIS — K625 Hemorrhage of anus and rectum: Secondary | ICD-10-CM

## 2016-08-08 DIAGNOSIS — K5900 Constipation, unspecified: Secondary | ICD-10-CM | POA: Diagnosis not present

## 2016-08-08 NOTE — Patient Instructions (Signed)
1. We will give you a list of primary care providers in [place]. 2. Have your labs drawn when you're able to. 3. We will help you schedule your MRI. 4. Take Colace (over-the-counter stool softener) every day. He can also use MiraLAX 3 times a week, up to every day as needed for soft, productive bowel movements. 5. An sure you're drinking adequate water and eating enough fiber. He should have 4-5 servings of fruits and vegetables a day. Will help with her bowel movements. 6. Return for follow-up in 2 months.

## 2016-08-08 NOTE — Progress Notes (Signed)
Primary Care Physician:  Wendie Simmer, MD Primary Gastroenterologist:  Dr. Gala Romney  Chief Complaint  Patient presents with  . Referral    abnormal liver CT    HPI:   Ruth Daniels is a 20 y.o. female who presents on referral from the emergency department for abnormal liver CT. She was seen in the emergency department 05/10/2016 for chest pain. Per ER notes the pain is sharp and radiates to her mid and lower back. Per the patient her boyfriend "found her passed out" at which point he was unable to awaken her but she did awaken to EMS calling her name. No other associated symptoms. Lab results including CBC, CMP, urinalysis essentially normal. CT of the head was normal. 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 Nonopacified veins are peripheral and likely challenging for Doppler and suggested multiphasic liver MRI with contrast. No noted cirrhosis. Images consistent with passive congestion but right heart appears normal . Per ED physician liver findings been present since CT scan of the abdomen in 2015 and the patient has not followed up. Recommended GI follow-up and she was discharged in stable condition.  Today she states she's doing well overall. Notes intermittent left-sided abdominal pain. Denies N/V, melena. Notes occasional hematochezia about once a month, blood in the stool.  Typically has a bowel movement every few days, stools hard, requires prolonged sitting and straining. This is chronic for her, has not tried any medications. Denies yellowing of skin/eyes, darkened urine (states she does have occasional "really dark yellow" urine), acute episodic confusion, tremors, generalized pruritis. Denies excessive bruising/bleeding. Did have another episode of chest pain a couple weeks ago which resolved with sitting down, taking deep breaths, and "calming down." Denies dyspnea,  dizziness, lightheadedness, syncope, near syncope. Denies any other upper or lower GI symptoms.  Past Medical History:  Diagnosis Date  . Allergy    Penicillin  . Chlamydia trachomatis infection in pregnancy   . Eczema   . Endometriosis   . GBS (group B streptococcus) UTI complicating pregnancy [id number]  . PID (acute pelvic inflammatory disease)     Past Surgical History:  Procedure Laterality Date  . FOOT SURGERY      Current Outpatient Prescriptions  Medication Sig Dispense Refill  . EPINEPHrine 0.3 mg/0.3 mL IJ SOAJ injection Inject 0.3 mLs (0.3 mg total) into the muscle once. 1 Device 1   No current facility-administered medications for this visit.     Allergies as of 08/08/2016 - Review Complete 08/08/2016  Allergen Reaction Noted  . Bee venom Anaphylaxis 05/22/2015  . Latex Rash 08/19/2013  . Mushroom extract complex Other (See Comments) 05/22/2015  . Penicillins Rash and Other (See Comments) 01/07/2012    Family History  Problem Relation Age of Onset  . Depression Mother   . Colon cancer Neg Hx     Social History   Social History  . Marital status: Single    Spouse name: N/A  . Number of children: N/A  . Years of education: N/A   Occupational History  . Student     12th grade- [employer]   Social History Main Topics  . Smoking status: Current Some Day Smoker    Packs/day: 0.25    Types: Cigarettes  . Smokeless tobacco: Never Used  . Alcohol use Yes     Comment: occasional (holidays and birthdays)  . Drug use:     Frequency:  7.0 times per week    Types: Marijuana  . Sexual activity: Yes    Birth control/ protection: None   Other Topics Concern  . Not on file   Social History Narrative  . No narrative on file    Review of Systems: General: Negative for anorexia, weight loss, fever, chills, fatigue, weakness. ENT: Negative for hoarseness, difficulty swallowing. CV: Negative for palpitations, peripheral edema.  Respiratory: Negative  for dyspnea at rest, cough, sputum, wheezing.  GI: See history of present illness. MS: Negative for joint pain, low back pain.  Derm: Negative for rash or itching.  Endo: Negative for unusual weight change.  Heme: Negative for bruising or bleeding. Allergy: Negative for rash or hives.    Physical Exam: BP 110/69   Pulse 65   Temp 97.7 F (36.5 C) (Oral)   Ht 5' (1.524 m)   Wt 113 lb 6.4 oz (51.4 kg)   LMP 08/07/2016 (Exact Date)   BMI 22.15 kg/m  General:   Alert and oriented. Pleasant and cooperative. Well-nourished and well-developed.  Head:  Normocephalic and atraumatic. Eyes:  Without icterus, sclera clear and conjunctiva pink.  Ears:  Normal auditory acuity. Cardiovascular:  S1, S2 present without murmurs appreciated. Extremities without clubbing or edema. Respiratory:  Clear to auscultation bilaterally. No wheezes, rales, or rhonchi. No distress.  Gastrointestinal:  +BS, soft, and non-distended. Mild LLQ TTP. No HSM noted. No guarding or rebound. No masses appreciated.  Rectal:  Deferred  Musculoskalatal:  Symmetrical without gross deformities Neurologic:  Alert and oriented x4;  grossly normal neurologically. Psych:  Alert and cooperative. Normal mood and affect. Heme/Lymph/Immune: No excessive bruising noted.    08/08/2016 11:36 AM   Disclaimer: This note was dictated with voice recognition software. Similar sounding words can inadvertently be transcribed and may not be corrected upon review.

## 2016-08-08 NOTE — Assessment & Plan Note (Signed)
Patient notes rare rectal bleeding about once a month when she is having particularly exacerbated constipation and hard stools. She has been pregnant and given birth 66. Notes prolonged sitting and straining for bowel movements. Likely hemorrhoids at this point. We will address her constipation and have her return for follow-up in 2 months. If she continues to have rectal bleeding consider further evaluation including possible endoscopic evaluation.

## 2016-08-08 NOTE — Assessment & Plan Note (Signed)
Patient with noted constipation has a bowel movement every 2-3 days, notes that stools are hard and difficult to pass requiring prolonged sitting and straining on the commode. Also rare/occasional rectal bleeding. I will have her start a daily Colace stool softener, MiraLAX 3 times a week up to once a day as needed for productive, soft bowel movements. Increase water and fiber. Return for follow-up in 2 months.

## 2016-08-08 NOTE — Assessment & Plan Note (Signed)
Patient with abnormal CT of the abdomen for decreased liver perfusion, recommended further workup for possible Budd-Ciari syndrome. She does not have any acute liver disease presentation at this time. No noted cirrhosis on CT of the abdomen. These findings on her CT were also present on a CAT scan done in 2015. Radiologist recommends follow-up with MRI liver protocol. I will also order labs including CBC, CMP, PTT/INR to evaluate for any liver dysfunction. Return for follow-up in 2 months.

## 2016-08-08 NOTE — Progress Notes (Signed)
cc'ed to pcp °

## 2016-08-21 ENCOUNTER — Ambulatory Visit (HOSPITAL_COMMUNITY): Admission: RE | Admit: 2016-08-21 | Payer: Medicaid Other | Source: Ambulatory Visit

## 2016-08-31 ENCOUNTER — Ambulatory Visit (HOSPITAL_COMMUNITY): Admission: RE | Admit: 2016-08-31 | Payer: Medicaid Other | Source: Ambulatory Visit

## 2016-10-08 ENCOUNTER — Ambulatory Visit: Payer: Self-pay | Admitting: Nurse Practitioner

## 2016-10-08 ENCOUNTER — Encounter: Payer: Self-pay | Admitting: Nurse Practitioner

## 2016-10-08 ENCOUNTER — Telehealth: Payer: Self-pay | Admitting: Nurse Practitioner

## 2016-10-08 NOTE — Telephone Encounter (Signed)
Noted  

## 2016-10-08 NOTE — Telephone Encounter (Signed)
PT WAS A NO SHOW AND LETTER SENT  °

## 2016-11-13 ENCOUNTER — Emergency Department (HOSPITAL_COMMUNITY)
Admission: EM | Admit: 2016-11-13 | Discharge: 2016-11-13 | Disposition: A | Discharge status: Home / Self Care | Payer: Medicaid Other | Attending: Emergency Medicine | Admitting: Emergency Medicine

## 2016-11-13 ENCOUNTER — Encounter (HOSPITAL_COMMUNITY): Payer: Self-pay | Admitting: Emergency Medicine

## 2016-11-13 ENCOUNTER — Emergency Department (HOSPITAL_COMMUNITY): Payer: Medicaid Other

## 2016-11-13 DIAGNOSIS — Z91040 Latex allergy status: Secondary | ICD-10-CM | POA: Diagnosis not present

## 2016-11-13 DIAGNOSIS — R05 Cough: Secondary | ICD-10-CM | POA: Insufficient documentation

## 2016-11-13 DIAGNOSIS — R109 Unspecified abdominal pain: Secondary | ICD-10-CM | POA: Insufficient documentation

## 2016-11-13 DIAGNOSIS — F17210 Nicotine dependence, cigarettes, uncomplicated: Secondary | ICD-10-CM | POA: Diagnosis not present

## 2016-11-13 DIAGNOSIS — R1110 Vomiting, unspecified: Secondary | ICD-10-CM | POA: Diagnosis not present

## 2016-11-13 DIAGNOSIS — R079 Chest pain, unspecified: Secondary | ICD-10-CM | POA: Insufficient documentation

## 2016-11-13 LAB — URINALYSIS, ROUTINE W REFLEX MICROSCOPIC
BILIRUBIN URINE: NEGATIVE
GLUCOSE, UA: NEGATIVE mg/dL
KETONES UR: NEGATIVE mg/dL
Leukocytes, UA: NEGATIVE
Nitrite: NEGATIVE
PROTEIN: NEGATIVE mg/dL
Specific Gravity, Urine: 1.015 (ref 1.005–1.030)
pH: 6.5 (ref 5.0–8.0)

## 2016-11-13 LAB — CBC WITH DIFFERENTIAL/PLATELET
BASOS PCT: 1 %
Basophils Absolute: 0 10*3/uL (ref 0.0–0.1)
EOS ABS: 0 10*3/uL (ref 0.0–0.7)
EOS PCT: 1 %
HCT: 36 % (ref 36.0–46.0)
Hemoglobin: 12.2 g/dL (ref 12.0–15.0)
LYMPHS ABS: 2.5 10*3/uL (ref 0.7–4.0)
Lymphocytes Relative: 34 %
MCH: 31.8 pg (ref 26.0–34.0)
MCHC: 33.9 g/dL (ref 30.0–36.0)
MCV: 93.8 fL (ref 78.0–100.0)
MONO ABS: 0.5 10*3/uL (ref 0.1–1.0)
MONOS PCT: 7 %
NEUTROS PCT: 59 %
Neutro Abs: 4.4 10*3/uL (ref 1.7–7.7)
PLATELETS: 212 10*3/uL (ref 150–400)
RBC: 3.84 MIL/uL — ABNORMAL LOW (ref 3.87–5.11)
RDW: 12.3 % (ref 11.5–15.5)
WBC: 7.5 10*3/uL (ref 4.0–10.5)

## 2016-11-13 LAB — BASIC METABOLIC PANEL
ANION GAP: 6 (ref 5–15)
BUN: 15 mg/dL (ref 6–20)
CALCIUM: 9 mg/dL (ref 8.9–10.3)
CO2: 27 mmol/L (ref 22–32)
CREATININE: 0.54 mg/dL (ref 0.44–1.00)
Chloride: 106 mmol/L (ref 101–111)
GFR calc Af Amer: >60 mL/min (ref >60)
GLUCOSE: 82 mg/dL (ref 65–99)
Potassium: 3.4 mmol/L — ABNORMAL LOW (ref 3.5–5.1)
Sodium: 139 mmol/L (ref 135–145)

## 2016-11-13 LAB — PREGNANCY, URINE: PREG TEST UR: NEGATIVE

## 2016-11-13 LAB — URINE MICROSCOPIC-ADD ON

## 2016-11-13 LAB — LIPASE, BLOOD: Lipase: 22 U/L (ref 11–51)

## 2016-11-13 NOTE — ED Triage Notes (Signed)
Pt reports intermittent chest pain x 2 months. Denies other symptoms. Pt states the pain is worse with deep breaths. Pt also c/o abdominal "tightness" in th LLQ x 1 week. Pt reports emesis and diarrhea.

## 2016-11-13 NOTE — Discharge Instructions (Signed)
There are no abnormal findings to explain the discomfort that you're having today. It is important to get plenty of rest, drink a lot of fluids, and use a medication such as Tylenol or Motrin for pain.

## 2016-11-13 NOTE — ED Provider Notes (Signed)
[facility] DEPT Provider Note   CSN: [id number] Arrival date & time: 11/13/16  1238  By signing my name below, I, [name], attest that this documentation has been prepared under the direction and in the presence of Daleen Bo, MD . Electronically Signed: [name], Scribe. 11/13/2016. 1:16 PM.  History   Chief Complaint Chief Complaint  Patient presents with  . Chest Pain   The history is provided by the patient. No language interpreter was used.   HPI Comments: Ruth Daniels is a 20 y.o. female who presents to the Emergency Department complaining of intermittent, central chest pain that began 1 month ago. Pt reports associated left-mid abdominal pain that began 2 weeks ago, 1 episode of vomiting 3 days ago and intermittent dry cough. She denies decreased appetite, fever, shortness of breath and leg swelling, She states her last normal menstrual period was last month.   Past Medical History:  Diagnosis Date  . Allergy    Penicillin  . Chlamydia trachomatis infection in pregnancy   . Eczema   . Endometriosis   . GBS (group B streptococcus) UTI complicating pregnancy [id number]  . PID (acute pelvic inflammatory disease)     Patient Active Problem List   Diagnosis Date Noted  . Abnormal CT of liver 08/08/2016  . Constipation 08/08/2016  . Rectal bleeding 08/08/2016  . Schizoaffective disorder, depressive type ([place]) 10/08/2015  . Cannabis use disorder, severe, dependence ([place]) 10/08/2015  . Suicidal ideation 10/07/2015  . IUD check up 04/01/2014  . PTSD (post-traumatic stress disorder) 02/19/2012    Past Surgical History:  Procedure Laterality Date  . FOOT SURGERY      OB History    Gravida Para Term Preterm AB Living   1 1 1     1    SAB TAB Ectopic Multiple Live Births           1     Home Medications    Prior to Admission medications   Medication Sig Start Date End Date Taking? Authorizing Provider  EPINEPHrine 0.3 mg/0.3 mL IJ SOAJ injection  Inject 0.3 mLs (0.3 mg total) into the muscle once. 10/14/15   Benjamine Mola, FNP    Family History Family History  Problem Relation Age of Onset  . Depression Mother   . Colon cancer Neg Hx     Social History Social History  Substance Use Topics  . Smoking status: Current Some Day Smoker    Packs/day: 0.25    Types: Cigarettes  . Smokeless tobacco: Never Used  . Alcohol use Yes     Comment: occasional (holidays and birthdays)     Allergies   Bee venom; Latex; Mushroom extract complex; and Penicillins   Review of Systems Review of Systems  Constitutional: Negative for appetite change and fever.  Respiratory: Positive for cough. Negative for shortness of breath.   Cardiovascular: Positive for chest pain. Negative for leg swelling.  Gastrointestinal: Positive for abdominal pain and vomiting.  All other systems reviewed and are negative.  Physical Exam Updated Vital Signs BP 119/69 (BP Location: Left Arm)   Pulse 70   Temp 98.4 F (36.9 C) (Oral)   Resp 20   Ht 5' (1.524 m)   Wt 115 lb (52.2 kg)   LMP 11/05/2016   SpO2 100%   BMI 22.46 kg/m   Physical Exam  Constitutional: She is oriented to person, place, and time. She appears well-developed and well-nourished.  HENT:  Head: Normocephalic.  Eyes: EOM are normal.  Neck:  Normal range of motion.  Cardiovascular: Normal rate, regular rhythm and normal heart sounds.   Pulmonary/Chest: Effort normal and breath sounds normal. She has no wheezes. She has no rales.  Good air flow bilaterally  Abdominal: She exhibits no distension and no mass. There is tenderness. There is no rebound and no guarding.  Mild left-mid abdominal tenderness.  Musculoskeletal: Normal range of motion.  Neurological: She is alert and oriented to person, place, and time.  Psychiatric: She has a normal mood and affect.  Nursing note and vitals reviewed.  ED Treatments / Results  Labs (all labs ordered are listed, but only abnormal results  are displayed) Labs Reviewed  BASIC METABOLIC PANEL  LIPASE, BLOOD  CBC WITH DIFFERENTIAL/PLATELET  PREGNANCY, URINE  URINALYSIS, ROUTINE W REFLEX MICROSCOPIC (NOT AT [facility])    EKG  EKG Interpretation  Date/Time:  Tuesday November 13 2016 12:43:59 EST Ventricular Rate:  74 PR Interval:  132 QRS Duration: 78 QT Interval:  372 QTC Calculation: 412 R Axis:   102 Text Interpretation:  Normal sinus rhythm Rightward axis Borderline ECG since last tracing no significant change Confirmed by Eulis Foster  MD, Korina Tretter [phone]) on 11/13/2016 12:57:46 PM       Radiology Dg Chest 2 View  Result Date: 11/13/2016 CLINICAL DATA:  Intermittent mid chest pain for the past several months associated with shortness of breath and cough. Current smoker. EXAM: CHEST  2 VIEW COMPARISON:  PA and lateral chest x-ray of October 06, 2015 FINDINGS: The lungs are mildly hyperinflated but clear. The heart and pulmonary vascularity are normal. The mediastinum is normal in width. There is no pleural effusion or pneumothorax. The bony thorax is unremarkable. IMPRESSION: Mild hyperinflation may be voluntary or may reflect underlying reactive airway disease and the patient's smoking history. There is no acute cardiopulmonary abnormality. Electronically Signed   By: David  Martinique M.D.   On: 11/13/2016 13:06    Procedures Procedures (including critical care time)  Medications Ordered in ED Medications - No data to display  DIAGNOSTIC STUDIES:  Oxygen Saturation is 100% on RA, normal by my interpretation.    COORDINATION OF CARE:  1:15 PM Discussed treatment plan with pt at bedside and pt agreed to plan.  Initial Impression / Assessment and Plan / ED Course  I have reviewed the triage vital signs and the nursing notes.  Pertinent labs & imaging results that were available during my care of the patient were reviewed by me and considered in my medical decision making (see chart for details).  Clinical Course      Medications - No data to display  Patient Vitals for the past 24 hrs:  BP Temp Temp src Pulse Resp SpO2 Height Weight  11/13/16 1241 119/69 98.4 F (36.9 C) Oral 70 20 100 % 5' (1.524 m) 115 lb (52.2 kg)    3:04 PM Reevaluation with update and discussion. After initial assessment and treatment, an updated evaluation reveals She remains comfortable. She has no further complaints. Findings discussed with patient, all questions were answered. Alleene Stoy L    I personally performed the services described in this documentation, which was scribed in my presence. The recorded information has been reviewed and is accurate.   Final Clinical Impressions(s) / ED Diagnoses   Final diagnoses:  Nonspecific chest pain    Nonspecific chest and abdominal pain with reassuring evaluation. Doubt pneumonia, serious bacterial infection, colitis, bowel obstruction, or metabolic instability.  Nursing Notes Reviewed/ Care Coordinated Applicable Imaging Reviewed Interpretation of Laboratory Data  incorporated into ED treatment  The patient appears reasonably screened and/or stabilized for discharge and I doubt any other medical condition or other [facility] requiring further screening, evaluation, or treatment in the ED at this time prior to discharge.  Plan: Home Medications- IBU or APAP for pain; Home Treatments- rest, fluids; return here if the recommended treatment, does not improve the symptoms; Recommended follow up- PCP prn  New Prescriptions New Prescriptions   No medications on file     Daleen Bo, MD 11/13/16 [phone]

## 2016-11-27 ENCOUNTER — Encounter: Payer: Self-pay | Admitting: *Deleted

## 2016-11-27 ENCOUNTER — Other Ambulatory Visit: Payer: Self-pay | Admitting: Adult Health

## 2018-07-21 ENCOUNTER — Encounter (HOSPITAL_COMMUNITY): Payer: Self-pay

## 2018-07-21 ENCOUNTER — Emergency Department (HOSPITAL_COMMUNITY)
Admission: EM | Admit: 2018-07-21 | Discharge: 2018-07-21 | Disposition: A | Discharge status: Home / Self Care | Payer: Medicaid Other | Attending: Emergency Medicine | Admitting: Emergency Medicine

## 2018-07-21 ENCOUNTER — Other Ambulatory Visit: Payer: Self-pay

## 2018-07-21 DIAGNOSIS — N939 Abnormal uterine and vaginal bleeding, unspecified: Secondary | ICD-10-CM | POA: Insufficient documentation

## 2018-07-21 DIAGNOSIS — F17210 Nicotine dependence, cigarettes, uncomplicated: Secondary | ICD-10-CM | POA: Diagnosis not present

## 2018-07-21 DIAGNOSIS — N946 Dysmenorrhea, unspecified: Secondary | ICD-10-CM | POA: Insufficient documentation

## 2018-07-21 DIAGNOSIS — Z91040 Latex allergy status: Secondary | ICD-10-CM | POA: Diagnosis not present

## 2018-07-21 DIAGNOSIS — R109 Unspecified abdominal pain: Secondary | ICD-10-CM | POA: Diagnosis present

## 2018-07-21 HISTORY — DX: Post-traumatic stress disorder, unspecified: F43.10

## 2018-07-21 LAB — URINALYSIS, ROUTINE W REFLEX MICROSCOPIC
Bilirubin Urine: NEGATIVE
Glucose, UA: NEGATIVE mg/dL
Ketones, ur: NEGATIVE mg/dL
NITRITE: NEGATIVE
PH: 7 (ref 5.0–8.0)
Protein, ur: 100 mg/dL — AB
SPECIFIC GRAVITY, URINE: 1.017 (ref 1.005–1.030)

## 2018-07-21 LAB — CBC
HEMATOCRIT: 36.9 % (ref 36.0–46.0)
HEMOGLOBIN: 12.5 g/dL (ref 12.0–15.0)
MCH: 31.8 pg (ref 26.0–34.0)
MCHC: 33.9 g/dL (ref 30.0–36.0)
MCV: 93.9 fL (ref 78.0–100.0)
Platelets: 207 10*3/uL (ref 150–400)
RBC: 3.93 MIL/uL (ref 3.87–5.11)
RDW: 12.1 % (ref 11.5–15.5)
WBC: 6 10*3/uL (ref 4.0–10.5)

## 2018-07-21 LAB — COMPREHENSIVE METABOLIC PANEL
ALBUMIN: 4.2 g/dL (ref 3.5–5.0)
ALK PHOS: 49 U/L (ref 38–126)
ALT: 10 U/L (ref 0–44)
ANION GAP: 9 (ref 5–15)
AST: 18 U/L (ref 15–41)
BILIRUBIN TOTAL: 0.9 mg/dL (ref 0.3–1.2)
BUN: 5 mg/dL — ABNORMAL LOW (ref 6–20)
CALCIUM: 9.6 mg/dL (ref 8.9–10.3)
CO2: 26 mmol/L (ref 22–32)
Chloride: 107 mmol/L (ref 98–111)
Creatinine, Ser: 0.71 mg/dL (ref 0.44–1.00)
GFR calc non Af Amer: >60 mL/min (ref >60)
GLUCOSE: 96 mg/dL (ref 70–99)
POTASSIUM: 3.2 mmol/L — AB (ref 3.5–5.1)
SODIUM: 142 mmol/L (ref 135–145)
TOTAL PROTEIN: 7.6 g/dL (ref 6.5–8.1)

## 2018-07-21 LAB — LIPASE, BLOOD: Lipase: 31 U/L (ref 11–51)

## 2018-07-21 LAB — I-STAT BETA HCG BLOOD, ED (MC, WL, AP ONLY)

## 2018-07-21 NOTE — Discharge Instructions (Addendum)
Please read attached information. If you experience any new or worsening signs or symptoms please return to the emergency room for evaluation. Please follow-up with your primary care provider or specialist as discussed.  °

## 2018-07-21 NOTE — ED Provider Notes (Signed)
[facility] EMERGENCY DEPARTMENT Provider Note   CSN: [id number] Arrival date & time: 07/21/18  1141     History   Chief Complaint Chief Complaint  Patient presents with  . Abdominal Pain  . Vaginal Bleeding    HPI Ruth Daniels is a 22 y.o. female.  HPI   22 year old female presents today with complaints of vaginal bleeding. Patient notes that her last normal menstrual cycle was on 06/21/2018. She reports starting her most recent cycle 3 days ago, she notes this started normally with pelvic cramping that has slowly worsened with heavier bleeding than normal. Patient notes that so far today she is used 3 pads, she notes clotting. Patient denies any unilateral pain, acute onset of pain, fever chills nausea or vomiting. She denies any preceding discharge. She notes taking ibuprofen at home with minimal improvement in her symptoms. No known trauma.    Past Medical History:  Diagnosis Date  . Allergy    Penicillin  . Chlamydia trachomatis infection in pregnancy   . Eczema   . Endometriosis   . GBS (group B streptococcus) UTI complicating pregnancy 7/61/9509  . PID (acute pelvic inflammatory disease)   . PTSD (post-traumatic stress disorder)     Patient Active Problem List   Diagnosis Date Noted  . Abnormal CT of liver 08/08/2016  . Constipation 08/08/2016  . Rectal bleeding 08/08/2016  . Schizoaffective disorder, depressive type ([place]) 10/08/2015  . Cannabis use disorder, severe, dependence ([place]) 10/08/2015  . Suicidal ideation 10/07/2015  . IUD check up 04/01/2014  . PTSD (post-traumatic stress disorder) 02/19/2012    Past Surgical History:  Procedure Laterality Date  . FOOT SURGERY       OB History    Gravida  1   Para  1   Term  1   Preterm      AB      Living  1     SAB      TAB      Ectopic      Multiple      Live Births  1          Home Medications    Prior to Admission medications   Medication  Sig Start Date End Date Taking? Authorizing Provider  EPINEPHrine 0.3 mg/0.3 mL IJ SOAJ injection Inject 0.3 mLs (0.3 mg total) into the muscle once. 10/14/15   [place], Elyse Jarvis, FNP    Family History Family History  Problem Relation Age of Onset  . Depression Mother   . Colon cancer Neg Hx     Social History Social History   Tobacco Use  . Smoking status: Current Some Day Smoker    Packs/day: 0.25    Types: Cigarettes  . Smokeless tobacco: Never Used  Substance Use Topics  . Alcohol use: Yes    Comment: occasional (holidays and birthdays)  . Drug use: Yes    Frequency: 7.0 times per week    Types: Marijuana    Comment: 1 month ago     Allergies   Bee venom; Latex; Mushroom extract complex; and Penicillins   Review of Systems Review of Systems  All other systems reviewed and are negative.   Physical Exam Updated Vital Signs BP 128/83   Pulse 76   Temp 98.8 F (37.1 C)   Resp 14   Ht 5\' 1"  (1.549 m)   Wt 54.4 kg (120 lb)   LMP 06/24/2018   SpO2 100%   BMI 22.67 kg/m  Physical Exam  Constitutional: She is oriented to person, place, and time. She appears well-developed and well-nourished.  HENT:  Head: Normocephalic and atraumatic.  Eyes: Pupils are equal, round, and reactive to light. Conjunctivae are normal. Right eye exhibits no discharge. Left eye exhibits no discharge. No scleral icterus.  Neck: Normal range of motion. No JVD present. No tracheal deviation present.  Pulmonary/Chest: Effort normal. No stridor.  Abdominal: Soft. Bowel sounds are normal. She exhibits no distension and no mass. There is tenderness. There is no rebound and no guarding. No hernia.  Generalized abd ttp, non focal   Neurological: She is alert and oriented to person, place, and time. Coordination normal.  Psychiatric: She has a normal mood and affect. Her behavior is normal. Judgment and thought content normal.  Nursing note and vitals reviewed.   ED Treatments / Results    Labs (all labs ordered are listed, but only abnormal results are displayed) Labs Reviewed  COMPREHENSIVE METABOLIC PANEL - Abnormal; Notable for the following components:      Result Value   Potassium 3.2 (*)    BUN 5 (*)    All other components within normal limits  URINALYSIS, ROUTINE W REFLEX MICROSCOPIC - Abnormal; Notable for the following components:   APPearance CLOUDY (*)    Hgb urine dipstick LARGE (*)    Protein, ur 100 (*)    Leukocytes, UA MODERATE (*)    All other components within normal limits  LIPASE, BLOOD  CBC  I-STAT BETA HCG BLOOD, ED ([facility], [facility], [facility] ONLY)    EKG None  Radiology No results found.  Procedures Procedures (including critical care time)  Medications Ordered in ED Medications - No data to display   Initial Impression / Assessment and Plan / ED Course  I have reviewed the triage vital signs and the nursing notes.  Pertinent labs & imaging results that were available during my care of the patient were reviewed by me and considered in my medical decision making (see chart for details).     Labs:  I stat beta hcg, lipase, CMP, cbc, UA  Imaging:  Consults:  Therapeutics:  Discharge Meds:   Assessment/Plan: 68 YOF presents today with complaints of abdominal pain and pelvic bleeding. This is likely her regular menstrual cycle. She appears to be in no acute distress, has reassuring vital signs with no hemodynamic instability, no anemia. Patient refused pelvic exam, I find this reasonable as I have low suspicion for significant bleed. She will return immediately if symptoms persist or worsen. Patient verbalized understanding and agreement to today's plan had no further questions or concerns. She has no signs of infectious etiology including appendicitis or any other intra-abdominal pathology.      Final Clinical Impressions(s) / ED Diagnoses   Final diagnoses:  Dysmenorrhea    ED Discharge Orders    None       Francee Gentile 07/21/18 1528    Isla Pence, MD 07/21/18 1540

## 2018-07-21 NOTE — ED Triage Notes (Signed)
Pt from home c.o generalized abd pain that started on Saturday with n/v. Pt also c.o vaginal bleeding since Saturday, about 2 pads in one hr per pt. LMP 6/25.

## 2018-07-21 NOTE — ED Notes (Signed)
Pt called out c.o SOB, pt seemed anxious, lung sounds clear on exam, oxygen saturation at 100% on room air, rn able to calm pt down and slow her breathing. nad at this time, pt talking on phone when rn leaving the room

## 2018-08-08 ENCOUNTER — Emergency Department (HOSPITAL_COMMUNITY): Payer: Medicaid Other

## 2018-08-08 ENCOUNTER — Encounter (HOSPITAL_COMMUNITY): Payer: Self-pay | Admitting: Emergency Medicine

## 2018-08-08 ENCOUNTER — Emergency Department (HOSPITAL_COMMUNITY)
Admission: EM | Admit: 2018-08-08 | Discharge: 2018-08-08 | Disposition: A | Discharge status: Home / Self Care | Payer: Medicaid Other | Attending: Emergency Medicine | Admitting: Emergency Medicine

## 2018-08-08 ENCOUNTER — Other Ambulatory Visit: Payer: Self-pay

## 2018-08-08 DIAGNOSIS — R0602 Shortness of breath: Secondary | ICD-10-CM | POA: Diagnosis not present

## 2018-08-08 DIAGNOSIS — F17210 Nicotine dependence, cigarettes, uncomplicated: Secondary | ICD-10-CM | POA: Insufficient documentation

## 2018-08-08 DIAGNOSIS — Z91040 Latex allergy status: Secondary | ICD-10-CM | POA: Insufficient documentation

## 2018-08-08 DIAGNOSIS — F1210 Cannabis abuse, uncomplicated: Secondary | ICD-10-CM | POA: Diagnosis not present

## 2018-08-08 LAB — POC URINE PREG, ED: Preg Test, Ur: NEGATIVE

## 2018-08-08 MED ORDER — IPRATROPIUM-ALBUTEROL 0.5-2.5 (3) MG/3ML IN SOLN
3.0000 mL | Freq: Once | RESPIRATORY_TRACT | Status: AC
  Administered 2018-08-08: 3 mL via RESPIRATORY_TRACT
  Filled 2018-08-08: qty 3

## 2018-08-08 MED ORDER — ALBUTEROL SULFATE (5 MG/ML) 0.5% IN NEBU: 2.5000 mg | INHALATION_SOLUTION | Freq: Four times a day (QID) | RESPIRATORY_TRACT | 0 refills | Status: DC | PRN

## 2018-08-08 NOTE — ED Provider Notes (Signed)
[facility] EMERGENCY DEPARTMENT Provider Note   CSN: [id number] Arrival date & time: 08/08/18  1140     History   Chief Complaint Chief Complaint  Patient presents with  . Shortness of Breath    HPI Ruth Daniels is a 22 y.o. female with history of asthma, endometriosis, eczema presents for evaluation of acute onset, persistent shortness of breath which began at around this morning at 11 AM.  Its that she woke up feeling short of breath.  She denies chest pain but endorses mild chest tightness.  She states this feels consistent with her usual asthma exacerbations.  She notes cough productive of white-yellow sputum.  Denies fevers or chills.  No aggravating or alleviating factors noted.  She denies nasal congestion, sore throat, or ear pain.  No recent travel or surgeries, no hemoptysis, no prior history of DVT or PE, no swelling.  She has used her albuterol inhaler 6-8 times today without significant relief of her symptoms.  She is a current smoker of approximately a pack of cigarettes daily and smokes marijuana multiple times weekly.  The history is provided by the patient.    Past Medical History:  Diagnosis Date  . Allergy    Penicillin  . Chlamydia trachomatis infection in pregnancy   . Eczema   . Endometriosis   . GBS (group B streptococcus) UTI complicating pregnancy 08/28/5620  . PID (acute pelvic inflammatory disease)   . PTSD (post-traumatic stress disorder)     Patient Active Problem List   Diagnosis Date Noted  . Abnormal CT of liver 08/08/2016  . Constipation 08/08/2016  . Rectal bleeding 08/08/2016  . Schizoaffective disorder, depressive type ([place]) 10/08/2015  . Cannabis use disorder, severe, dependence ([place]) 10/08/2015  . Suicidal ideation 10/07/2015  . IUD check up 04/01/2014  . PTSD (post-traumatic stress disorder) 02/19/2012    Past Surgical History:  Procedure Laterality Date  . FOOT SURGERY       OB History    Gravida  1   Para  1   Term  1   Preterm      AB      Living  1     SAB      TAB      Ectopic      Multiple      Live Births  1            Home Medications    Prior to Admission medications   Medication Sig Start Date End Date Taking? Authorizing Provider  albuterol (PROVENTIL) (5 MG/ML) 0.5% nebulizer solution Take 0.5 mLs (2.5 mg total) by nebulization every 6 (six) hours as needed for wheezing or shortness of breath. 08/08/18   Bridgett Hattabaugh A, PA-C  EPINEPHrine 0.3 mg/0.3 mL IJ SOAJ injection Inject 0.3 mLs (0.3 mg total) into the muscle once. 10/14/15   [place], Elyse Jarvis, FNP    Family History Family History  Problem Relation Age of Onset  . Depression Mother   . Colon cancer Neg Hx     Social History Social History   Tobacco Use  . Smoking status: Current Some Day Smoker    Packs/day: 0.25    Types: Cigarettes  . Smokeless tobacco: Never Used  Substance Use Topics  . Alcohol use: Yes    Comment: occasional (holidays and birthdays)  . Drug use: Yes    Frequency: 7.0 times per week    Types: Marijuana    Comment: 1 month ago  Allergies   Bee venom; Latex; Mushroom extract complex; and Penicillins   Review of Systems Review of Systems  Constitutional: Negative for chills and fever.  Respiratory: Positive for cough, chest tightness and shortness of breath.   Cardiovascular: Negative for chest pain.  All other systems reviewed and are negative.    Physical Exam Updated Vital Signs BP 135/78 (BP Location: Right Arm)   Pulse 82   Temp 98.7 F (37.1 C) (Oral)   Resp 16   LMP  (LMP Unknown)   SpO2 99%   Physical Exam  Constitutional: She appears well-developed and well-nourished. No distress.  Resting comfortably in chair  HENT:  Head: Normocephalic and atraumatic.  Eyes: Conjunctivae are normal. Right eye exhibits no discharge. Left eye exhibits no discharge.  Neck: No JVD present. No tracheal deviation present.  Cardiovascular: Normal rate,  regular rhythm and normal heart sounds.  Pulmonary/Chest: Effort normal. She exhibits tenderness.  Equal rise and fall of chest, no increased work of breathing.  Speaking in full sentences without difficulty.  No wheezing on auscultation of the lungs although she has slightly diminished breath sounds globally  Abdominal: Soft. Bowel sounds are normal. She exhibits no distension.  Musculoskeletal: She exhibits no edema.       Right lower leg: Normal. She exhibits no tenderness and no edema.       Left lower leg: Normal. She exhibits no tenderness and no edema.  Tenderness palpation of the right anterior superior chest wall focally as well as the left lateral inferior chest wall with no deformity, crepitus, ecchymosis, or L segment noted.  Neurological: She is alert.  Skin: Skin is warm and dry. No erythema.  Psychiatric: She has a normal mood and affect. Her behavior is normal.  Nursing note and vitals reviewed.    ED Treatments / Results  Labs (all labs ordered are listed, but only abnormal results are displayed) Labs Reviewed  POC URINE PREG, ED    EKG ED ECG REPORT   Date: 08/08/2018  Rate: 83  Rhythm: normal sinus rhythm  QRS Axis: right  Intervals: normal  ST/T Wave abnormalities: normal  Conduction Disutrbances:none  Narrative Interpretation:   Old EKG Reviewed: unchanged  I have personally reviewed the EKG tracing and agree with the computerized printout as noted.   Radiology Dg Chest 2 View  Result Date: 08/08/2018 CLINICAL DATA:  Asthma attack today EXAM: CHEST - 2 VIEW COMPARISON:  November 13, 2016 FINDINGS: The heart size and mediastinal contours are within normal limits. Both lungs are clear. The visualized skeletal structures are unremarkable. IMPRESSION: No active cardiopulmonary disease. Electronically Signed   By: Abelardo Diesel M.D.   On: 08/08/2018 15:20    Procedures Procedures (including critical care time)  Medications Ordered in ED Medications    ipratropium-albuterol (DUONEB) 0.5-2.5 (3) MG/3ML nebulizer solution 3 mL (3 mLs Nebulization Given 08/08/18 1428)     Initial Impression / Assessment and Plan / ED Course  I have reviewed the triage vital signs and the nursing notes.  Pertinent labs & imaging results that were available during my care of the patient were reviewed by me and considered in my medical decision making (see chart for details).     Patient presents for evaluation of acute onset of shortness of breath.  She is afebrile, vital signs are stable.  She is nontoxic in appearance.  No increased work of breathing on examination.  Lungs are clear to auscultation.  Chest x-ray shows no acute cardiopulmonary disease.  EKG shows no ischemic changes, no concern for ACS/MI.  Patient is extremely low risk for cardiac etiology of symptoms.Doubt PE, patient is PERC negative.  She is given a breathing treatment with significant improvement in her symptoms.  She declines prescription for prednisone, but does request orders for a nebulizer machine and albuterol nebulizers.  I think this is reasonable for her asthma exacerbations.  I did instruct the patient to follow-up with her PCP for reevaluation especially with increased use of her albuterol nebulizers/inhaler.  She also requested pregnancy test which was negative.  Her abdomen is soft and nontender.  Discussed strict ED return precautions.  Patient and patient's sisters verbalized understanding of and agreement with plan and patient is stable for discharge home at this time.  Final Clinical Impressions(s) / ED Diagnoses   Final diagnoses:  Shortness of breath    ED Discharge Orders         Ordered    DME Nebulizer machine     08/08/18 1539    albuterol (PROVENTIL) (5 MG/ML) 0.5% nebulizer solution  Every 6 hours PRN     08/08/18 1539           Dylon Correa, [place] A, PA-C 08/08/18 1739    Davonna Belling, MD 08/09/18 [phone]

## 2018-08-08 NOTE — ED Notes (Signed)
Woke started to have cp and dizzy and sob , gave herself 2 pumps and she  Got woozy , now better  Eating crackers and peanut butter

## 2018-08-08 NOTE — ED Notes (Signed)
Patient verbalizes understanding of discharge instructions. Opportunity for questioning and answers were provided. Armband removed by staff, pt discharged from ED ambulatory.   

## 2018-08-08 NOTE — Discharge Instructions (Signed)
Use albuterol inhaler and nebulizer as needed for shortness of breath.  Drink plenty of water and get plenty rest.  He can also use over-the-counter allergy medicines or cold medicines for your cough.  Follow-up with your primary care physician for reevaluation of your symptoms and especially if you find you are using your inhalers too often.  Return to the emergency department if any concerning signs or symptoms develop such as worsening shortness of breath, chest pains, high fevers, coughing up blood, or shortness of breath despite using albuterol inhaler nebulizers.

## 2018-08-08 NOTE — ED Triage Notes (Signed)
Patient complains of shortness of breath since 1100 today. History of asthma, reports using albuterol inhaler without relief. SpO2 100% on room air in triage, speaking with complete sentences, no dyspnea observed. Patient alert, oriented, and in no apparent distress.  Patient also requests pregnancy test.

## 2018-09-09 ENCOUNTER — Ambulatory Visit: Payer: Self-pay | Admitting: Obstetrics and Gynecology

## 2018-09-10 NOTE — Progress Notes (Unsigned)
Patient did not show for her appointment 09/09/18   Noni Saupe I, NP 09/10/2018 8:29 AM

## 2018-09-30 ENCOUNTER — Emergency Department (HOSPITAL_COMMUNITY)
Admission: EM | Admit: 2018-09-30 | Discharge: 2018-09-30 | Disposition: A | Discharge status: Home / Self Care | Payer: Medicaid Other | Attending: Emergency Medicine | Admitting: Emergency Medicine

## 2018-09-30 ENCOUNTER — Emergency Department (HOSPITAL_COMMUNITY): Payer: Medicaid Other

## 2018-09-30 ENCOUNTER — Encounter (HOSPITAL_COMMUNITY): Payer: Self-pay | Admitting: Emergency Medicine

## 2018-09-30 ENCOUNTER — Other Ambulatory Visit: Payer: Self-pay

## 2018-09-30 DIAGNOSIS — R05 Cough: Secondary | ICD-10-CM | POA: Diagnosis present

## 2018-09-30 DIAGNOSIS — R091 Pleurisy: Secondary | ICD-10-CM

## 2018-09-30 DIAGNOSIS — Z91040 Latex allergy status: Secondary | ICD-10-CM | POA: Diagnosis not present

## 2018-09-30 DIAGNOSIS — F17210 Nicotine dependence, cigarettes, uncomplicated: Secondary | ICD-10-CM | POA: Diagnosis not present

## 2018-09-30 MED ORDER — DEXAMETHASONE 4 MG PO TABS: 4.0000 mg | ORAL_TABLET | Freq: Two times a day (BID) | ORAL | 0 refills | Status: DC

## 2018-09-30 MED ORDER — KETOROLAC TROMETHAMINE 30 MG/ML IJ SOLN
15.0000 mg | Freq: Once | INTRAMUSCULAR | Status: AC
  Administered 2018-09-30: 15 mg via INTRAMUSCULAR
  Filled 2018-09-30: qty 1

## 2018-09-30 MED ORDER — DEXAMETHASONE 4 MG PO TABS
8.0000 mg | ORAL_TABLET | Freq: Once | ORAL | Status: AC
  Administered 2018-09-30: 8 mg via ORAL
  Filled 2018-09-30: qty 2

## 2018-09-30 NOTE — ED Provider Notes (Signed)
[facility] EMERGENCY DEPARTMENT Provider Note   CSN: [id number] Arrival date & time: 09/30/18  1452     History   Chief Complaint Chief Complaint  Patient presents with  . Cough    HPI Ruth Daniels is a 22 y.o. female.  HPI   22 year old female with cough and congestion.  Symptom onset about a week ago.  Dry cough.  Runny nose and facial pressure.  No fevers or chills.  No unusual leg pain or swelling.  No additional complaints some mild crampy lower abdominal pain and constipation.  Last bowel movement was about a week ago.  Past Medical History:  Diagnosis Date  . Allergy    Penicillin  . Chlamydia trachomatis infection in pregnancy   . Eczema   . Endometriosis   . GBS (group B streptococcus) UTI complicating pregnancy 7/62/8315  . PID (acute pelvic inflammatory disease)   . PTSD (post-traumatic stress disorder)     Patient Active Problem List   Diagnosis Date Noted  . Abnormal CT of liver 08/08/2016  . Constipation 08/08/2016  . Rectal bleeding 08/08/2016  . Schizoaffective disorder, depressive type ([place]) 10/08/2015  . Cannabis use disorder, severe, dependence ([place]) 10/08/2015  . Suicidal ideation 10/07/2015  . IUD check up 04/01/2014  . PTSD (post-traumatic stress disorder) 02/19/2012    Past Surgical History:  Procedure Laterality Date  . FOOT SURGERY       OB History    Gravida  1   Para  1   Term  1   Preterm      AB      Living  1     SAB      TAB      Ectopic      Multiple      Live Births  1            Home Medications    Prior to Admission medications   Medication Sig Start Date End Date Taking? Authorizing Provider  acetaminophen (TYLENOL) 500 MG tablet Take 500 mg by mouth every 6 (six) hours as needed for mild pain or moderate pain.   Yes [provider]  albuterol (PROAIR HFA) 108 (90 Base) MCG/ACT inhaler Inhale 1-2 puffs into the lungs every 6 (six) hours as needed for wheezing or shortness of  breath.   Yes [provider]  albuterol (PROVENTIL) (5 MG/ML) 0.5% nebulizer solution Take 0.5 mLs (2.5 mg total) by nebulization every 6 (six) hours as needed for wheezing or shortness of breath. 08/08/18  Yes Fawze, Mina A, PA-C  ibuprofen (ADVIL,MOTRIN) 200 MG tablet Take 200-400 mg by mouth daily as needed for mild pain or moderate pain.   Yes [provider]  dexamethasone (DECADRON) 4 MG tablet Take 1 tablet (4 mg total) by mouth 2 (two) times daily. 09/30/18   Virgel Manifold, MD  EPINEPHrine 0.3 mg/0.3 mL IJ SOAJ injection Inject 0.3 mLs (0.3 mg total) into the muscle once. 10/14/15   [place], Elyse Jarvis, FNP    Family History Family History  Problem Relation Age of Onset  . Depression Mother   . Colon cancer Neg Hx     Social History Social History   Tobacco Use  . Smoking status: Current Every Day Smoker    Packs/day: 3.00    Types: Cigarettes  . Smokeless tobacco: Never Used  Substance Use Topics  . Alcohol use: Yes    Comment: occasional (holidays and birthdays)  . Drug use: Yes    Frequency:  7.0 times per week    Types: Marijuana    Comment: last night      Allergies   Bee venom; Latex; Mushroom extract complex; and Penicillins   Review of Systems Review of Systems  All systems reviewed and negative, other than as noted in HPI.  Physical Exam Updated Vital Signs BP 120/77 (BP Location: Left Arm)   Pulse 95   Temp 98.8 F (37.1 C) (Oral)   Resp 12   Ht 5' (1.524 m)   Wt 56 kg   LMP 09/15/2018   SpO2 100%   BMI 24.13 kg/m   Physical Exam  Constitutional: She appears well-developed and well-nourished. No distress.  HENT:  Head: Normocephalic and atraumatic.  Eyes: Conjunctivae are normal. Right eye exhibits no discharge. Left eye exhibits no discharge.  Neck: Neck supple.  Cardiovascular: Normal rate, regular rhythm and normal heart sounds. Exam reveals no gallop and no friction rub.  No murmur heard. Pulmonary/Chest: Effort normal.  No respiratory distress. She has wheezes.  Faint expiratory wheezing.  No increased work of breathing.  Speaks in complete sentences.  Abdominal: Soft. She exhibits no distension. There is no tenderness.  Musculoskeletal: She exhibits no edema or tenderness.  Lower extremities symmetric as compared to each other. No calf tenderness. Negative Homan's. No palpable cords.   Neurological: She is alert.  Skin: Skin is warm and dry.  Psychiatric: She has a normal mood and affect. Her behavior is normal. Thought content normal.  Nursing note and vitals reviewed.    ED Treatments / Results  Labs (all labs ordered are listed, but only abnormal results are displayed) Labs Reviewed - No data to display  EKG None  Radiology Dg Chest 2 View  Result Date: 09/30/2018 CLINICAL DATA:  Chest pain and cough today EXAM: CHEST - 2 VIEW COMPARISON:  08/08/2018 FINDINGS: The heart size and mediastinal contours are within normal limits. Both lungs are clear. The visualized skeletal structures are unremarkable. IMPRESSION: No active cardiopulmonary disease. Electronically Signed   By: Inez Catalina M.D.   On: 09/30/2018 16:02    Procedures Procedures (including critical care time)  Medications Ordered in ED Medications  ketorolac (TORADOL) 30 MG/ML injection 15 mg (15 mg Intramuscular Given 09/30/18 1532)  dexamethasone (DECADRON) tablet 8 mg (8 mg Oral Given 09/30/18 1532)     Initial Impression / Assessment and Plan / ED Course  I have reviewed the triage vital signs and the nursing notes.  Pertinent labs & imaging results that were available during my care of the patient were reviewed by me and considered in my medical decision making (see chart for details).    23 year old female with pleuritic chest pain.  I doubt emergent cause of it such as PE, pneumothorax or serious infection or other.  Plan a course of steroids and bronchodilators as needed.  Patient recently increasing her smoking to 3  packs daily.  She was advised that unless she plans an necessarily early death that she needs to cut back dramatically or preferably quit.  Increase dietary fiber and stay well-hydrated with regards to constipation.  Final Clinical Impressions(s) / ED Diagnoses   Final diagnoses:  Pleurisy    ED Discharge Orders         Ordered    dexamethasone (DECADRON) 4 MG tablet  2 times daily     09/30/18 1621    DME Nebulizer machine     09/30/18 1643           Tyrez Berrios,  Annie Main, MD 10/10/18 1152

## 2018-09-30 NOTE — ED Triage Notes (Signed)
Cough, runny nose and rib pain with coughing and movement x 1 week.Ruth Daniels

## 2018-09-30 NOTE — ED Notes (Signed)
Patient transported to X-ray 

## 2018-09-30 NOTE — ED Notes (Addendum)
Pt reports she has recently increased to smoking 3 packs of cigarettes daily due to stress. Pt c/o dry cough, runny nose (clear in color) x 1 week. Pt also c/o LLQ abdominal pain and vomiting x 3 days. Denise dysuria, hematuria. Pt does report she hasn't had a BM in about 1 week and says she usually has one every 3-4 days.

## 2019-03-06 ENCOUNTER — Emergency Department (HOSPITAL_COMMUNITY)
Admission: EM | Admit: 2019-03-06 | Discharge: 2019-03-06 | Disposition: A | Discharge status: Home / Self Care | Payer: Medicaid Other | Attending: Emergency Medicine | Admitting: Emergency Medicine

## 2019-03-06 ENCOUNTER — Encounter (HOSPITAL_COMMUNITY): Payer: Self-pay | Admitting: Emergency Medicine

## 2019-03-06 ENCOUNTER — Other Ambulatory Visit: Payer: Self-pay

## 2019-03-06 ENCOUNTER — Emergency Department (HOSPITAL_COMMUNITY): Payer: Medicaid Other

## 2019-03-06 DIAGNOSIS — O99331 Smoking (tobacco) complicating pregnancy, first trimester: Secondary | ICD-10-CM | POA: Insufficient documentation

## 2019-03-06 DIAGNOSIS — O2331 Infections of other parts of urinary tract in pregnancy, first trimester: Secondary | ICD-10-CM | POA: Insufficient documentation

## 2019-03-06 DIAGNOSIS — F17210 Nicotine dependence, cigarettes, uncomplicated: Secondary | ICD-10-CM | POA: Diagnosis not present

## 2019-03-06 DIAGNOSIS — Z79899 Other long term (current) drug therapy: Secondary | ICD-10-CM | POA: Diagnosis not present

## 2019-03-06 DIAGNOSIS — O9989 Other specified diseases and conditions complicating pregnancy, childbirth and the puerperium: Secondary | ICD-10-CM | POA: Diagnosis present

## 2019-03-06 DIAGNOSIS — B379 Candidiasis, unspecified: Secondary | ICD-10-CM

## 2019-03-06 DIAGNOSIS — R1032 Left lower quadrant pain: Secondary | ICD-10-CM | POA: Insufficient documentation

## 2019-03-06 DIAGNOSIS — Z3A01 Less than 8 weeks gestation of pregnancy: Secondary | ICD-10-CM | POA: Insufficient documentation

## 2019-03-06 LAB — URINALYSIS, ROUTINE W REFLEX MICROSCOPIC
Bilirubin Urine: NEGATIVE
GLUCOSE, UA: NEGATIVE mg/dL
KETONES UR: 5 mg/dL — AB
Leukocytes,Ua: NEGATIVE
NITRITE: NEGATIVE
PROTEIN: NEGATIVE mg/dL
Specific Gravity, Urine: 1.029 (ref 1.005–1.030)
pH: 6 (ref 5.0–8.0)

## 2019-03-06 LAB — WET PREP, GENITAL
Clue Cells Wet Prep HPF POC: NONE SEEN
Sperm: NONE SEEN
Trich, Wet Prep: NONE SEEN

## 2019-03-06 LAB — I-STAT BETA HCG BLOOD, ED (MC, WL, AP ONLY): I-stat hCG, quantitative: 199.4 m[IU]/mL — ABNORMAL HIGH (ref <5)

## 2019-03-06 LAB — HCG, QUANTITATIVE, PREGNANCY: hCG, Beta Chain, Quant, S: 229 m[IU]/mL — ABNORMAL HIGH (ref <5)

## 2019-03-06 MED ORDER — NITROFURANTOIN MONOHYD MACRO 100 MG PO CAPS: 100.0000 mg | ORAL_CAPSULE | Freq: Two times a day (BID) | ORAL | 0 refills | Status: DC

## 2019-03-06 MED ORDER — CLOTRIMAZOLE 1 % VA CREA: 1.0000 | TOPICAL_CREAM | Freq: Every day | VAGINAL | 0 refills | Status: DC

## 2019-03-06 NOTE — Discharge Instructions (Signed)
Please take medication as prescribed and finish the full course of both Follow up at the [facility] and [facility] located in this hospital on [address] on Sunday for repeat quantitative pregnancy test Return to the ED for worsening pain, fever, chills, vaginal bleeding.

## 2019-03-06 NOTE — ED Notes (Signed)
Patient verbalized understanding of discharge instructions. Opportunities for questioning and answers were provided. Armband removed by staff. Patient removed from ED.   

## 2019-03-06 NOTE — ED Triage Notes (Signed)
Pt requesting a blood test. States she had 2 positive pregnancy tests.

## 2019-03-06 NOTE — ED Provider Notes (Signed)
[facility] EMERGENCY DEPARTMENT Provider Note   CSN: [id number] Arrival date & time: 03/06/19  1418    History   Chief Complaint Chief Complaint  Patient presents with  . Possible Pregnancy    HPI Ruth Daniels is a 23 y.o. female.     Pt presents to the ED for pregnancy check. She reports taking multiple at home pregnancy tests over the weekend and 2 days ago which were all positive; she is requesting a blood test at the moment. Pt currently complains of mild LLQ abdominal pain that began about 1 week ago and also endorses an increase in vaginal discharge. No foul smelling odor to discharge. No thick white discharge or abnormal color to pt's normal discharge. LNMP 02/08/2019. Pt has 1 child; no other pregnancies. G2P1001. Denies fever, chills, nausea, vomiting, chest pain, shortness of breath, pelvic pain, vaginal bleeding, rush of fluids.    Possible Pregnancy  Associated symptoms include abdominal pain. Pertinent negatives include no chest pain and no shortness of breath.    Past Medical History:  Diagnosis Date  . Allergy    Penicillin  . Chlamydia trachomatis infection in pregnancy   . Eczema   . Endometriosis   . GBS (group B streptococcus) UTI complicating pregnancy 04/08/7352  . PID (acute pelvic inflammatory disease)   . PTSD (post-traumatic stress disorder)     Patient Active Problem List   Diagnosis Date Noted  . Abnormal CT of liver 08/08/2016  . Constipation 08/08/2016  . Rectal bleeding 08/08/2016  . Schizoaffective disorder, depressive type ([place]) 10/08/2015  . Cannabis use disorder, severe, dependence ([place]) 10/08/2015  . Suicidal ideation 10/07/2015  . IUD check up 04/01/2014  . PTSD (post-traumatic stress disorder) 02/19/2012    Past Surgical History:  Procedure Laterality Date  . FOOT SURGERY       OB History    Gravida  1   Para  1   Term  1   Preterm      AB      Living  1     SAB      TAB      Ectopic        Multiple      Live Births  1            Home Medications    Prior to Admission medications   Medication Sig Start Date End Date Taking? Authorizing Provider  acetaminophen (TYLENOL) 500 MG tablet Take 500 mg by mouth every 6 (six) hours as needed for mild pain or moderate pain.    [provider]  albuterol (PROAIR HFA) 108 (90 Base) MCG/ACT inhaler Inhale 1-2 puffs into the lungs every 6 (six) hours as needed for wheezing or shortness of breath.    [provider]  albuterol (PROVENTIL) (5 MG/ML) 0.5% nebulizer solution Take 0.5 mLs (2.5 mg total) by nebulization every 6 (six) hours as needed for wheezing or shortness of breath. 08/08/18   Fawze, Mina A, PA-C  clotrimazole (GYNE-LOTRIMIN) 1 % vaginal cream Place 1 Applicatorful vaginally at bedtime for 7 days. 03/06/19 03/13/19  Marionna Gonia, PA-C  dexamethasone (DECADRON) 4 MG tablet Take 1 tablet (4 mg total) by mouth 2 (two) times daily. 09/30/18   Virgel Manifold, MD  EPINEPHrine 0.3 mg/0.3 mL IJ SOAJ injection Inject 0.3 mLs (0.3 mg total) into the muscle once. 10/14/15   [place], Elyse Jarvis, FNP  ibuprofen (ADVIL,MOTRIN) 200 MG tablet Take 200-400 mg by mouth daily as needed for mild  pain or moderate pain.    [provider]  nitrofurantoin, macrocrystal-monohydrate, (MACROBID) 100 MG capsule Take 1 capsule (100 mg total) by mouth 2 (two) times daily for 7 days. 03/06/19 03/13/19  Eustaquio Maize, PA-C    Family History Family History  Problem Relation Age of Onset  . Depression Mother   . Colon cancer Neg Hx     Social History Social History   Tobacco Use  . Smoking status: Current Every Day Smoker    Packs/day: 3.00    Types: Cigarettes  . Smokeless tobacco: Never Used  Substance Use Topics  . Alcohol use: Yes    Comment: occasional (holidays and birthdays)  . Drug use: Yes    Frequency: 7.0 times per week    Types: Marijuana    Comment: last night      Allergies   Bee venom; Latex;  Mushroom extract complex; and Penicillins   Review of Systems Review of Systems  Constitutional: Negative for chills and fever.  HENT: Negative for ear pain and sore throat.   Respiratory: Negative for shortness of breath.   Cardiovascular: Negative for chest pain.  Gastrointestinal: Positive for abdominal pain. Negative for constipation, diarrhea, nausea and vomiting.  Genitourinary: Positive for vaginal discharge. Negative for dysuria, hematuria, pelvic pain, urgency, vaginal bleeding and vaginal pain.  Musculoskeletal: Negative for back pain and neck pain.  Skin: Negative for wound.     Physical Exam Updated Vital Signs BP 130/75 (BP Location: Right Arm)   Pulse 72   Temp 98.2 F (36.8 C) (Oral)   Resp 16   Ht 5' (1.524 m)   Wt 51.7 kg   LMP 02/08/2019 (Exact Date)   SpO2 100%   BMI 22.26 kg/m   Physical Exam Vitals signs and nursing note reviewed. Exam conducted with a chaperone present.  Constitutional:      General: She is not in acute distress.    Appearance: She is well-developed.  HENT:     Head: Normocephalic and atraumatic.  Eyes:     Conjunctiva/sclera: Conjunctivae normal.  Neck:     Musculoskeletal: Neck supple.  Cardiovascular:     Rate and Rhythm: Normal rate and regular rhythm.     Heart sounds: No murmur.  Pulmonary:     Effort: Pulmonary effort is normal. No respiratory distress.     Breath sounds: Normal breath sounds.  Abdominal:     Palpations: Abdomen is soft.     Tenderness: There is abdominal tenderness.     Comments: Mild LLQ tenderness to palpation. No rebound or guarding.   Genitourinary:    General: Normal vulva.     Vagina: Normal.     Cervix: Normal.     Adnexa:        Right: No tenderness.         Left: Tenderness (Mild tenderness to left adnexa with bimanual exam) present.      Comments: Os is closed Musculoskeletal: Normal range of motion.        General: No swelling.  Skin:    General: Skin is warm and dry.    Neurological:     Mental Status: She is alert.      ED Treatments / Results  Labs (all labs ordered are listed, but only abnormal results are displayed) Labs Reviewed  WET PREP, GENITAL - Abnormal; Notable for the following components:      Result Value   Yeast Wet Prep HPF POC PRESENT (*)    WBC, Wet Prep  HPF POC FEW (*)    All other components within normal limits  URINALYSIS, ROUTINE W REFLEX MICROSCOPIC - Abnormal; Notable for the following components:   APPearance HAZY (*)    Hgb urine dipstick SMALL (*)    Ketones, ur 5 (*)    Bacteria, UA RARE (*)    All other components within normal limits  HCG, QUANTITATIVE, PREGNANCY - Abnormal; Notable for the following components:   hCG, Beta Chain, Quant, S 229 (*)    All other components within normal limits  I-STAT BETA HCG BLOOD, ED ([facility], [facility], [facility] ONLY) - Abnormal; Notable for the following components:   I-stat hCG, quantitative 199.4 (*)    All other components within normal limits  RPR  HIV ANTIBODY (ROUTINE TESTING W REFLEX)  GC/CHLAMYDIA PROBE AMP ([place]) NOT AT [facility]    EKG None  Radiology US Ob Less Than 14 Weeks With Ob Transvaginal  Result Date: 03/06/2019 CLINICAL DATA:  Left lower quadrant abdominal pain for the past 2 days. Quantitative beta HCG 199. Unknown last menstrual period. EXAM: OBSTETRIC <14 WK [place] AND TRANSVAGINAL OB US TECHNIQUE: Both transabdominal and transvaginal ultrasound examinations were performed for complete evaluation of the gestation as well as the maternal uterus, adnexal regions, and pelvic cul-de-sac. Transvaginal technique was performed to assess early pregnancy. COMPARISON:  Abdomen and pelvis CT dated 05/10/2016. FINDINGS: Intrauterine gestational sac: Not visualized Yolk sac:  Not visualized Embryo:  Nonvisualized Maternal uterus/adnexae: Normal appearing uterus and endometrial stripe with a tiny amount of fluid in the endometrial cavity. This is elongated within the cavity with no  discrete fluid collection. No retained products of conception seen. Normal appearing maternal ovaries with a corpus luteum noted on the left. Trace amount of free peritoneal fluid, within normal limits of physiological fluid. IMPRESSION: Normal examination with the exception of a tiny amount of fluid in the endometrial cavity, most likely representing a tiny amount of blood. No intrauterine or extrauterine gestation demonstrated no retained products of conception. Electronically Signed   By: Claudie Revering M.D.   On: 03/06/2019 17:24    Procedures Procedures (including critical care time)  Medications Ordered in ED Medications - No data to display   Initial Impression / Assessment and Plan / ED Course  I have reviewed the triage vital signs and the nursing notes.  Pertinent labs & imaging results that were available during my care of the patient were reviewed by me and considered in my medical decision making (see chart for details).  Clinical Course as of Mar 06 2207  Fri Mar 06, 2019  1557 Comment 3:        [MV]  1607 Patient seen by myself with Eustaquio Maize, PA.  Confirmed positive pregnancy test here, LMP 02/08/2019.  Patient complaining of some intermittent left lower quadrant pains.  Discussed with on-call OB/GYN APP Mickel Baas who recommends ultrasound to confirm IUP, if ultrasound shows that it is too early to detect at this time patient should go to the MAU in 2 days for repeat quant hCG.   [KF]    Clinical Course User Index [KF] Jacqlyn Larsen, PA-C [MV] Eustaquio Maize, PA-C       Pt presents with multiple positive at home pregnancy tests; requesting blood test in the ED today. Complaints include increase in normal vaginal discharge and mild LLQ abdominal pain x 1 week. No vaginal bleeding, rush of fluids, urinary complaints, pelvic pain, fevers, or chills. LNMP 02/08/2019. G2P1001. Beta HCG positive 199.4.  4:03 PM Benedetto Goad, PA-C spoke with MAU concerning patient and if she  needs to be transported. See ED course. Ultrasound ordered. Pelvic exam to be completed with wet prep, GC/chlamydia, and bloodwork for HIV and syphilis   5:41 PM U/A with bacteria; no leuks or nitrites. Wet prep with yeast infection. Will treat pt for asymptomatic bacteruria given pt is pregnant; PCN allergy; will treat with Macrobid. Clotrimazole cream prescribed to patient for yeast infection considering pregnancy and avoidance of diflucan. Discussed findings with patient who is in agreement with plan including return to MAU on Sunday 03/08/2019 for repeat hCG. Pt is stable for discharge.   Final Clinical Impressions(s) / ED Diagnoses   Final diagnoses:  Less than [redacted] weeks gestation of pregnancy  Left lower quadrant abdominal pain  Yeast infection    ED Discharge Orders         Ordered    nitrofurantoin, macrocrystal-monohydrate, (MACROBID) 100 MG capsule  2 times daily     03/06/19 1807    clotrimazole (GYNE-LOTRIMIN) 1 % vaginal cream  Daily at bedtime     03 /06/20 [place], [place], PA-C 03/06/19 2209    Quintella Reichert, MD 03/07/19 2311

## 2019-03-06 NOTE — ED Notes (Signed)
Patient transported to Ultrasound 

## 2019-03-07 LAB — RPR: RPR Ser Ql: NONREACTIVE

## 2019-03-07 LAB — HIV ANTIBODY (ROUTINE TESTING W REFLEX): HIV Screen 4th Generation wRfx: NONREACTIVE

## 2019-03-08 ENCOUNTER — Inpatient Hospital Stay (HOSPITAL_COMMUNITY)
Admission: AD | Admit: 2019-03-08 | Discharge: 2019-03-08 | Disposition: A | Discharge status: Home / Self Care | Payer: Medicaid Other | Attending: Obstetrics & Gynecology | Admitting: Obstetrics & Gynecology

## 2019-03-08 ENCOUNTER — Other Ambulatory Visit: Payer: Self-pay

## 2019-03-08 ENCOUNTER — Encounter (HOSPITAL_COMMUNITY): Payer: Self-pay | Admitting: *Deleted

## 2019-03-08 DIAGNOSIS — O99330 Smoking (tobacco) complicating pregnancy, unspecified trimester: Secondary | ICD-10-CM | POA: Diagnosis not present

## 2019-03-08 DIAGNOSIS — F17210 Nicotine dependence, cigarettes, uncomplicated: Secondary | ICD-10-CM | POA: Insufficient documentation

## 2019-03-08 DIAGNOSIS — O3680X0 Pregnancy with inconclusive fetal viability, not applicable or unspecified: Secondary | ICD-10-CM | POA: Diagnosis present

## 2019-03-08 DIAGNOSIS — Z79899 Other long term (current) drug therapy: Secondary | ICD-10-CM | POA: Diagnosis not present

## 2019-03-08 LAB — HCG, QUANTITATIVE, PREGNANCY: hCG, Beta Chain, Quant, S: 559 m[IU]/mL — ABNORMAL HIGH (ref <5)

## 2019-03-08 NOTE — Discharge Instructions (Signed)

## 2019-03-08 NOTE — MAU Provider Note (Addendum)
History     CSN: [id number]  Arrival date and time: 03/08/19 1131   First Provider Initiated Contact with Patient 03/08/19 1151      Chief Complaint  Patient presents with  . Blood Work   HPI Ruth Daniels is a 23 y.o. G2P1001 who presents to MAU for repeat Quant hCG. She is s/p evaluation for abdominal pain and positive HPT at [facility] ED on 03/06/2019. She denies abdominal pain or vaginal bleeding today.  OB History    Gravida  2   Para  1   Term  1   Preterm      AB      Living  1     SAB      TAB      Ectopic      Multiple      Live Births  1           Past Medical History:  Diagnosis Date  . Allergy    Penicillin  . Chlamydia trachomatis infection in pregnancy   . Eczema   . Endometriosis   . GBS (group B streptococcus) UTI complicating pregnancy 1/63/8453  . PID (acute pelvic inflammatory disease)   . PTSD (post-traumatic stress disorder)     Past Surgical History:  Procedure Laterality Date  . FOOT SURGERY      Family History  Problem Relation Age of Onset  . Depression Mother   . Colon cancer Neg Hx     Social History   Tobacco Use  . Smoking status: Current Every Day Smoker    Packs/day: 3.00    Types: Cigarettes  . Smokeless tobacco: Never Used  Substance Use Topics  . Alcohol use: Yes    Comment: occasional (holidays and birthdays)  . Drug use: Yes    Frequency: 7.0 times per week    Types: Marijuana    Comment: last night     Allergies:  Allergies  Allergen Reactions  . Bee Venom Anaphylaxis  . Latex Rash  . Mushroom Extract Complex Other (See Comments)    unknown  . Penicillins Rash and Other (See Comments)    Has patient had a PCN reaction causing immediate rash, facial/tongue/throat swelling, SOB or lightheadedness with hypotension: unknown Has patient had a PCN reaction causing severe rash involving mucus membranes or skin necrosis: unknown Has patient had a PCN reaction that required hospitalization:  unknown Has patient had a PCN reaction occurring within the last 10 years: no If all of the above answers are "NO", then may proceed with Cephalosporin use.     Medications Prior to Admission  Medication Sig Dispense Refill Last Dose  . acetaminophen (TYLENOL) 500 MG tablet Take 500 mg by mouth every 6 (six) hours as needed for mild pain or moderate pain.   Past Week at Unknown time  . albuterol (PROAIR HFA) 108 (90 Base) MCG/ACT inhaler Inhale 1-2 puffs into the lungs every 6 (six) hours as needed for wheezing or shortness of breath.   09/30/2018 at Unknown time  . albuterol (PROVENTIL) (5 MG/ML) 0.5% nebulizer solution Take 0.5 mLs (2.5 mg total) by nebulization every 6 (six) hours as needed for wheezing or shortness of breath. 20 mL 0 Past Month at Unknown time  . clotrimazole (GYNE-LOTRIMIN) 1 % vaginal cream Place 1 Applicatorful vaginally at bedtime for 7 days. 45 g 0   . dexamethasone (DECADRON) 4 MG tablet Take 1 tablet (4 mg total) by mouth 2 (two) times daily. 6 tablet 0   .  EPINEPHrine 0.3 mg/0.3 mL IJ SOAJ injection Inject 0.3 mLs (0.3 mg total) into the muscle once. 1 Device 1 unknown  . ibuprofen (ADVIL,MOTRIN) 200 MG tablet Take 200-400 mg by mouth daily as needed for mild pain or moderate pain.   Past Week at Unknown time  . nitrofurantoin, macrocrystal-monohydrate, (MACROBID) 100 MG capsule Take 1 capsule (100 mg total) by mouth 2 (two) times daily for 7 days. 14 capsule 0     Review of Systems  Constitutional: Negative for chills, fatigue and fever.  Gastrointestinal: Negative for abdominal pain, nausea and vomiting.  Genitourinary: Negative for difficulty urinating, dysuria, flank pain, pelvic pain, vaginal bleeding and vaginal pain.  Musculoskeletal: Negative for back pain.  All other systems reviewed and are negative.  Physical Exam   Blood pressure 110/62, pulse 76, temperature 98.7 F (37.1 C), temperature source Oral, resp. rate 18, height 5' (1.524 m), weight 49.9  kg, last menstrual period 02/08/2019, SpO2 100 %.  Physical Exam  Nursing note and vitals reviewed. Constitutional: She is oriented to person, place, and time. She appears well-developed and well-nourished.  Respiratory: Effort normal.  GI: Soft.  Neurological: She is alert and oriented to person, place, and time.  Skin: Skin is warm.  Psychiatric: She has a normal mood and affect. Her behavior is normal. Judgment and thought content normal.    MAU Course  Procedures  --Quant hCG 229 on 03/06/2019, 559 today in MAU --No evidence of pregnancy on imaging 03/06/19. Reviewed bleeding and ectopic precautions with patient  Patient Vitals for the past 24 hrs:  BP Temp Temp src Pulse Resp SpO2 Height Weight  03/08/19 1147 110/62 98.7 F (37.1 C) Oral 76 18 100 % 5' (1.524 m) 49.9 kg    Results for orders placed or performed during the hospital encounter of 03/08/19 (from the past 24 hour(s))  hCG, quantitative, pregnancy     Status: Abnormal   Collection Time: 03/08/19 11:46 AM  Result Value Ref Range   hCG, Beta Chain, Quant, S 559 (H) <5 mIU/mL    Orders Placed This Encounter  Procedures  . US OB Transvaginal  . hCG, quantitative, pregnancy  . Discharge patient    Assessment and Plan  --23 y.o. G2P1001 with pregnancy of unknown location --Appropriate rise in Quant hCG --Reviewed safe medications in pregnancy --Discharge home in stable condition  F/U: Outpatient [place] ordered, two weeks from initial imaging. Patient knows to expect call  **Patient does not have phone. She uses her mother's phone for all contacts and gives permission for health-related voicemails to be left at number listed on her medical record  Darlina Rumpf, [place] 03/08/2019, 2:11 PM

## 2019-03-08 NOTE — MAU Note (Signed)
Presents for repeat labs.

## 2019-03-09 LAB — GC/CHLAMYDIA PROBE AMP (~~LOC~~) NOT AT ARMC
CHLAMYDIA, DNA PROBE: NEGATIVE
Neisseria Gonorrhea: NEGATIVE

## 2019-03-17 ENCOUNTER — Ambulatory Visit (HOSPITAL_COMMUNITY)
Admission: RE | Admit: 2019-03-17 | Discharge: 2019-03-17 | Disposition: A | Discharge status: Home / Self Care | Payer: Medicaid Other | Source: Ambulatory Visit | Attending: Advanced Practice Midwife | Admitting: Advanced Practice Midwife

## 2019-03-17 ENCOUNTER — Other Ambulatory Visit: Payer: Self-pay

## 2019-03-17 DIAGNOSIS — O3680X0 Pregnancy with inconclusive fetal viability, not applicable or unspecified: Secondary | ICD-10-CM | POA: Diagnosis present

## 2019-06-10 ENCOUNTER — Encounter: Payer: Self-pay | Admitting: *Deleted

## 2019-08-17 IMAGING — US US OB < 14 WEEKS - US OB TV
1 series · 13 of 28 positions shown · non-contrast
Comparison: Abdomen and pelvis CT dated 05/10/2016.

CLINICAL DATA: Left lower quadrant abdominal pain for the past 2
days. Quantitative beta HCG 199. Unknown last menstrual period.

EXAM:
OBSTETRIC <14 WK US AND TRANSVAGINAL OB US
TECHNIQUE: Both transabdominal and transvaginal ultrasound examinations were
performed for complete evaluation of the gestation as well as the
maternal uterus, adnexal regions, and pelvic cul-de-sac.
Transvaginal technique was performed to assess early pregnancy.

[Series 1: us ob < 14 weeks - us ob tv · 13 of 101 slices shown]
[im 4/101]
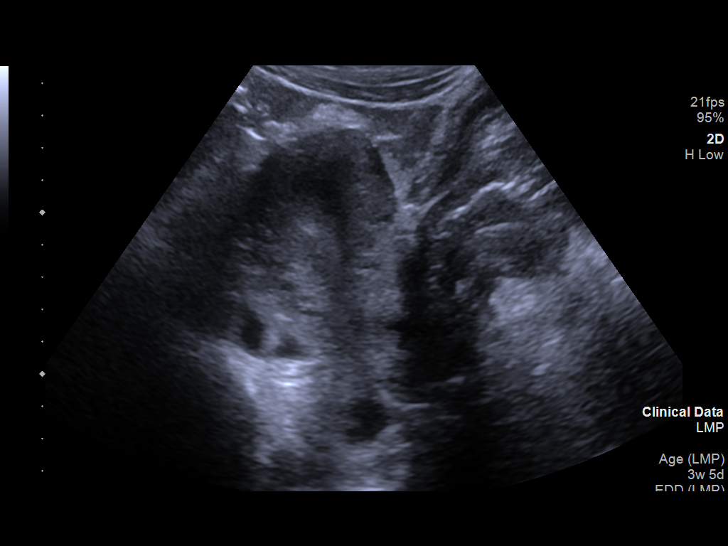
[im 12/101]
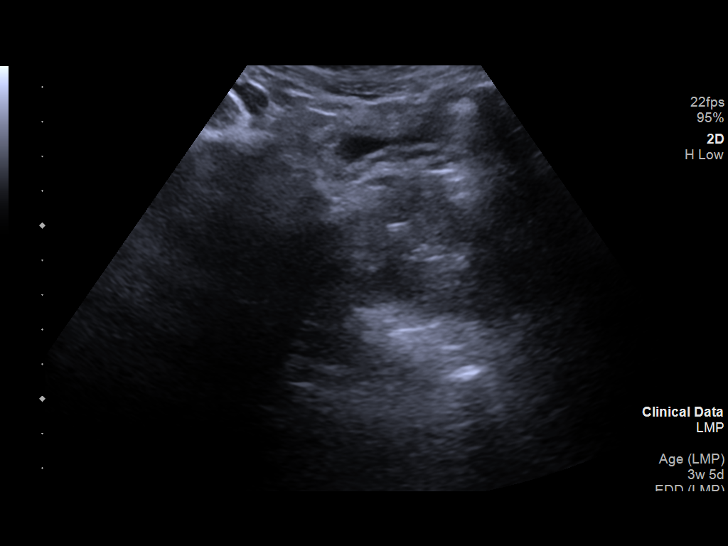
[im 19/101]
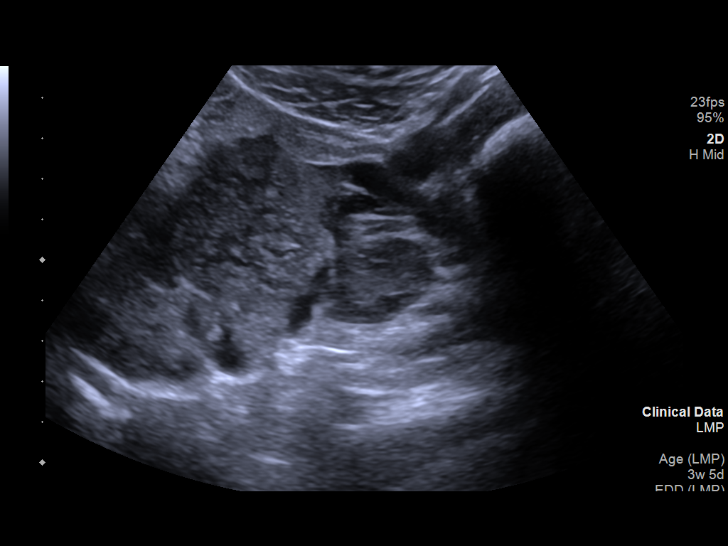
[im 26/101]
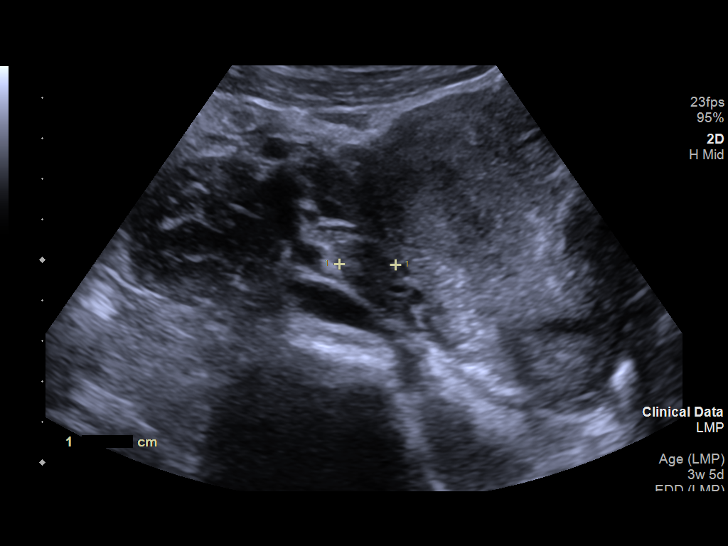
[im 34/101]
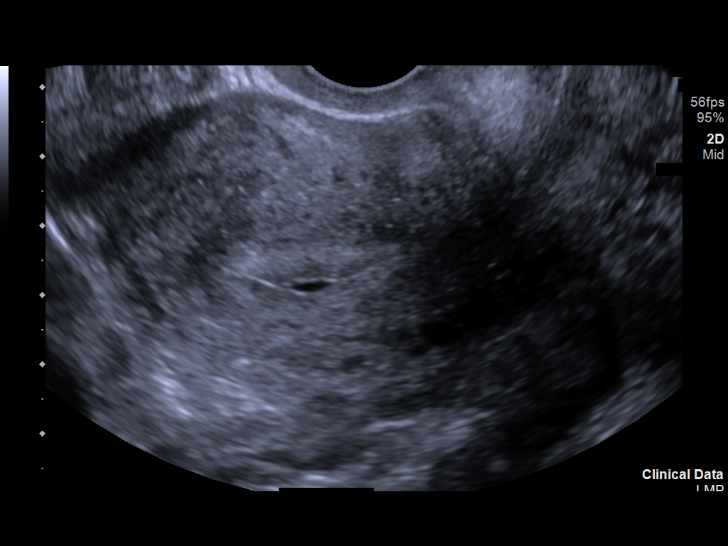
[im 41/101]
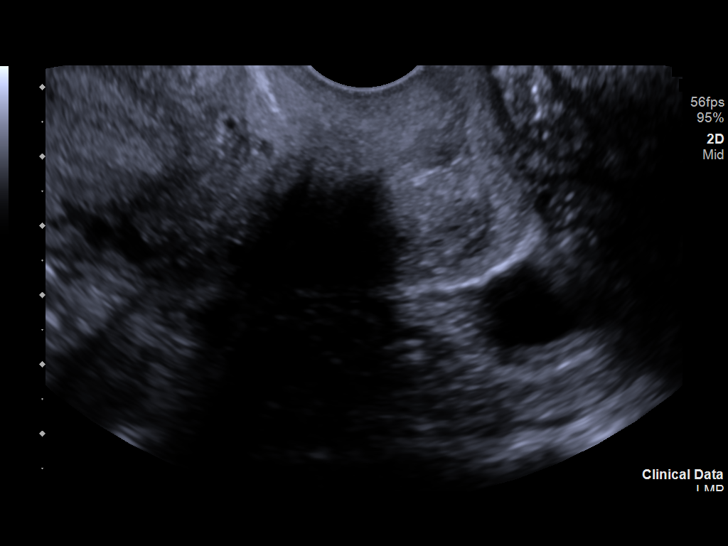
[im 52/101]
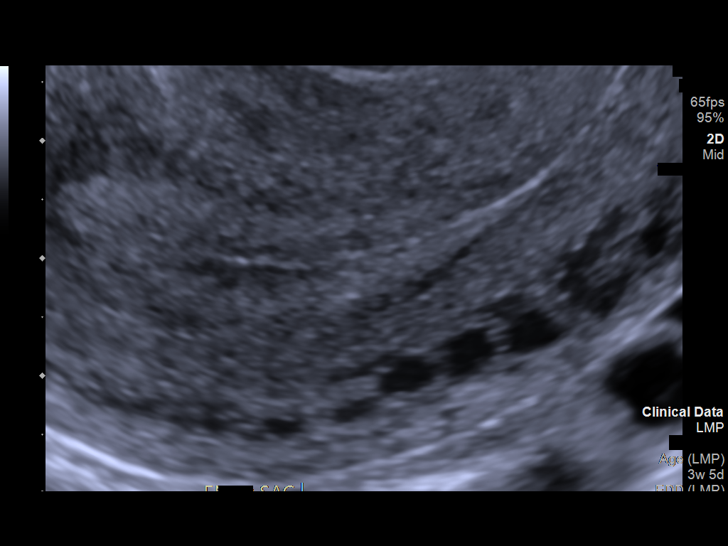
[im 60/101]
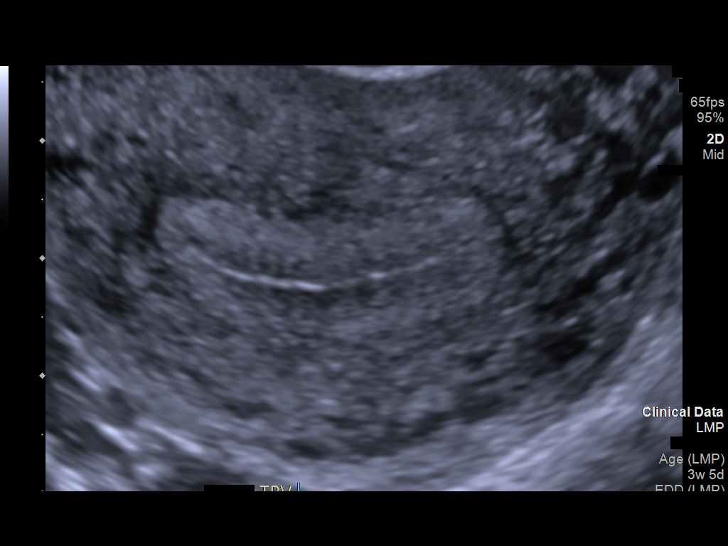
[im 67/101]
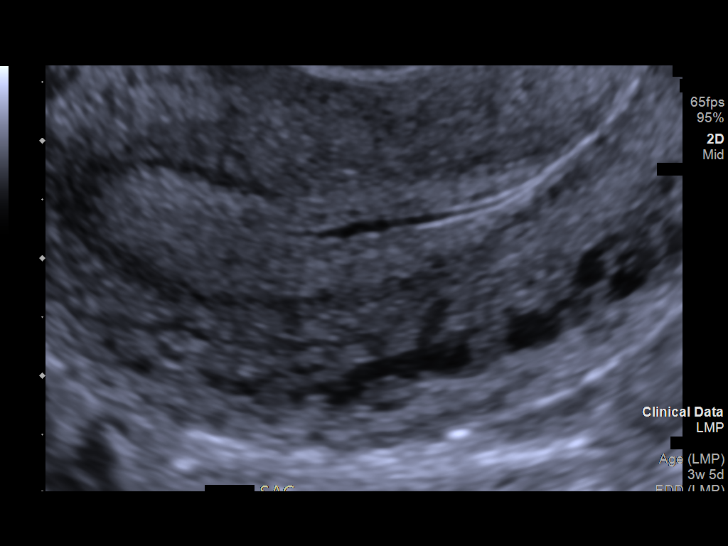
[im 75/101]
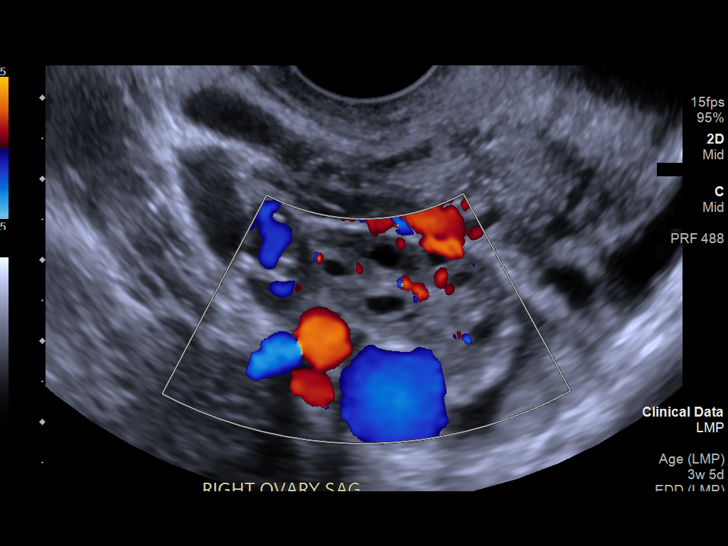
[im 82/101]
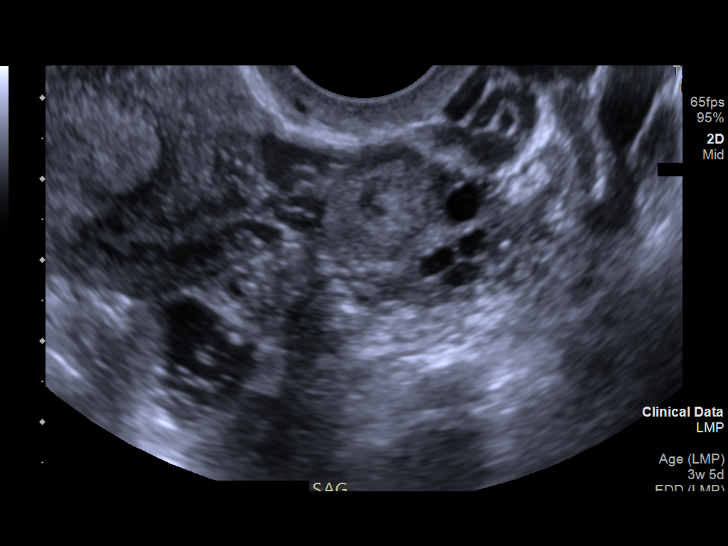
[im 89/101]
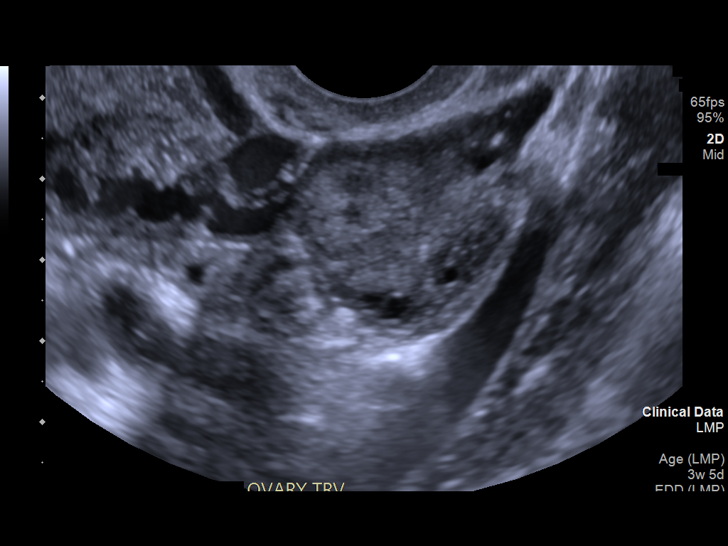
[im 97/101]
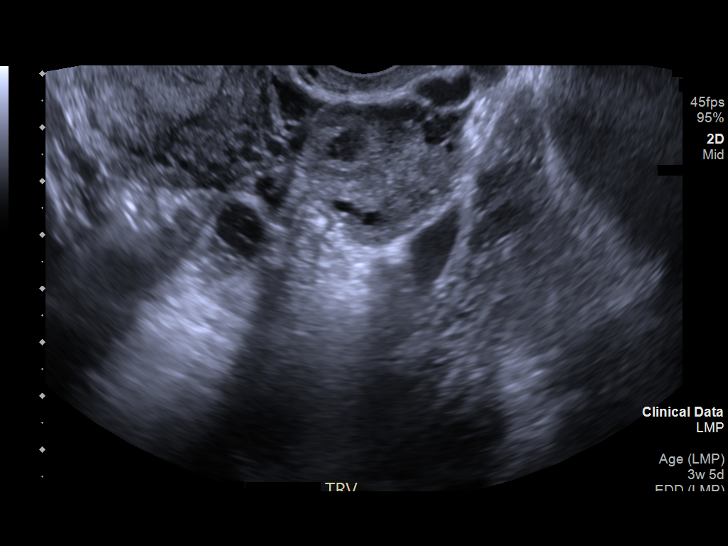

[13 of 28 positions shown; findings below may reference images not displayed]

FINDINGS: Intrauterine gestational sac: Not visualized

Yolk sac:  Not visualized

Embryo:  Nonvisualized

Maternal uterus/adnexae: Normal appearing uterus and endometrial
stripe with a tiny amount of fluid in the endometrial cavity. This
is elongated within the cavity with no discrete fluid collection. No
retained products of conception seen. Normal appearing maternal
ovaries with a corpus luteum noted on the left. Trace amount of free
peritoneal fluid, within normal limits of physiological fluid.
IMPRESSION: Normal examination with the exception of a tiny amount of fluid in
the endometrial cavity, most likely representing a tiny amount of
blood. No intrauterine or extrauterine gestation demonstrated no
retained products of conception.

## 2019-11-12 DIAGNOSIS — O24419 Gestational diabetes mellitus in pregnancy, unspecified control: Secondary | ICD-10-CM

## 2020-07-19 ENCOUNTER — Other Ambulatory Visit: Payer: Self-pay | Admitting: *Deleted

## 2020-07-19 NOTE — Patient Outreach (Signed)
[place] [facility]) Care Management  07/19/2020  Ruth Daniels 1996-04-14 [id number]  Referral received 7/20 Initial Outreach 7/20  RN spoke with pt today and introduced [facility] services and the purpose for today's call. Pt recently had a baby(8 months) and peds physician indicates pt "was in distress and did not seem ok.(emotional distress)" on recent visit.  RN further inquired and screened for depression. Pt denies and states she "just needs to talk with someone". RN further inquired on support system and inquired if pt need immediately counsel (decline immediately need).  Further assess if there is anyone threatening or harming her (denies).  Inquired on any nursing or medical needs or other resources  Needed at this time. Pt denies any other needs. RN provided contact number to [facility] for Find-A-Doctor and strongly encouraged pt to call and establish a primary provider who could continue to assist with this issues in there future.  Will make a Education officer, museum referral for the requested counseling. Pt is aware if there are any needs that RN needs to address to alert the social worker who would refer pt back to this RN case manager for assistance.  Plan will close this discipline out at this time. Note no primary provider to sent letter to at this time.  Raina Mina, RN Care Management Coordinator [place] Office [phone]

## 2020-07-22 ENCOUNTER — Other Ambulatory Visit: Payer: Self-pay | Admitting: *Deleted

## 2020-07-22 NOTE — Patient Outreach (Signed)
[place] [facility]) Care Management  07/22/2020  LATE ENTRY  Ruth Daniels 10-27-1996 [id number]  CSW received referral on 07/20/2020 for assessment of emotional stress/state.  CSW outreached pt on 07/21/2020 and spoke briefly with pt.who reports; "it's too early and still sleeping off and on" and asked is she could call CSW back.    CSW did not hear back from pt and called pt again on 07/22/2020.  CSW introduced self and asked if now was a good time to talk; pt replied that she "was in the middle of something".  CSW offered to call back next week and/or for pt to call when convenient time for her.   CSW will make an 3rd attempt next week.   Eduard Clos, MSW, [place] Worker  [place] [phone]

## 2020-07-25 ENCOUNTER — Other Ambulatory Visit: Payer: Self-pay | Admitting: *Deleted

## 2020-07-26 NOTE — Patient Outreach (Signed)
[place] [facility] Care Management  07/26/2020  Ruth Daniels 09/05/1996 [id number]    CSW was able to make contact with pt on 07/25/2020 and confirmed pt's identity.  CSW introduced self, role and reason for call; depression.  Pt was very engaged and willing to talk; and ask about "anger management" support too.  Pt shares that she has a 26yo son as well as her newborn 34month old son.  She has the support of her mother, brother and boyfriend; and her 59yo son is a big help too.  Pt admits to having a long history of mental health needs; "since I was placed in foster care when I was 11".  She was placed on RX for "sleep and anxiety" from the age of 44 to 66; stating she did not like the way it made her feel in school so she stopped.  "I felt like I was stoned".  CSW talked with pt about considering a trial of meds again and discussed the many options as well her adult age now which may make RX more tolerable. Pt denies feeling depressed however does admit to some "post partum" sadness/blues.  CSW talked with pt about "baby blues" as well as Post Partum Depression and the signs/symptoms to look for. Pt denies any SI or HI.    She is very interested in getting in to see a new PCP (does not currently have one) and to someone for counseling/therapy for mental health and anger management.  CSW commended pt for being aware and acknowledging her needs as well as asking for help.  CSW validated her feelings around her new relationship that she does not want to sabotage, her "past" and the anger/bitterness it produces as well as the depression.  CSW discussed plans with pt to seek options for in network providers as well as to link pt with community based resources that may be of help.  Pt is interested in seeking employment; stating she was told she should apply for disability years ago (because of her mental health diagnoses) but wants to work and feels she can.  CSW will connect pt to  Vocational  Rehabilitation for linking with counselor to assist pt with seeking work.   CSW will also send pt info on [employer] transportation for transportation needs as she does not drive.   Pt very appreciative of CSW call and assistance. CSW will follow up with pt once resources are obtained to provide fdor mental health outpatient psychotherapy.     Depression screen [facility] 2/9 07/26/2020 07/19/2020  Decreased Interest 1 0  Down, Depressed, Hopeless 1 0  PHQ - 2 Score 2 0  Altered sleeping 1 -  Tired, decreased energy 1 -  Change in appetite 0 -  Feeling bad or failure about yourself  0 -  Trouble concentrating 1 -  Moving slowly or fidgety/restless 1 -  Suicidal thoughts 0 -  PHQ-9 Score 6 -    Eduard Clos, MSW, LCSW Clinical Social Worker  [place], MSW, [place] Worker  [place] [phone]

## 2020-07-29 ENCOUNTER — Other Ambulatory Visit: Payer: Self-pay | Admitting: *Deleted

## 2020-07-29 ENCOUNTER — Ambulatory Visit: Payer: Self-pay | Admitting: *Deleted

## 2020-07-29 NOTE — Patient Outreach (Signed)
[place] [facility]) Care Management  07/29/2020  Keilin Gamboa 1996-12-16 [id number]   CSW attempted to reach pt by phone today and was unsuccessful.  CSW will attempt again in 3-4 business days if no return call is received.    Eduard Clos, MSW, [place] Worker  [place] [phone]

## 2020-08-03 ENCOUNTER — Ambulatory Visit: Payer: Self-pay | Admitting: *Deleted

## 2020-08-03 ENCOUNTER — Other Ambulatory Visit: Payer: Self-pay | Admitting: *Deleted

## 2020-08-03 NOTE — Patient Outreach (Signed)
[place] [facility]) Care Management  08/03/2020  Ruth Daniels 01/30/1996 [id number]   Care Coordination  RN received a message based upon there recent referral to [facility] social worker that pt was seeking to establish a primary provider. RN reached out to pt today and provided contact number for "Find A Doctor" through the [facility] system. Pt very appreciative. RN inquired on any other needs or barriers at this time. Pt denies an gain was grateful for the information provided today.   No additional needs at this time. Discipline closure remains in place.  Raina Mina, RN Care Management Coordinator [place] Office [phone]

## 2020-08-11 ENCOUNTER — Ambulatory Visit: Payer: Self-pay | Admitting: *Deleted

## 2020-08-12 ENCOUNTER — Other Ambulatory Visit: Payer: Self-pay | Admitting: *Deleted

## 2020-08-12 NOTE — Patient Outreach (Signed)
[place] [facility]) Care Management  08/12/2020  Ruth Daniels [birth year] [id number]  CSW attempted to reach pt by phone on 08/11/2020 and was unable to reach.  CSW left a HIPPA compliant voice message and will attempt outreach again in 3-4 business days if no return call is received.  Eduard Clos, MSW, [place] Worker  [place] [phone]

## 2020-08-16 ENCOUNTER — Other Ambulatory Visit: Payer: Self-pay | Admitting: *Deleted

## 2020-08-17 NOTE — Patient Outreach (Signed)
[place] [facility]) Care Management  08/17/2020  Rayla Pember 07/27/1996 [id number]   CSW spoke with pt by phone on 08/16/2020 to update and provide info on mental health counselors in her area.  CSW provided pt with info for pt to call and make appointment as she wishes.  Pt reports her baby is doing well, her post partum emotions and anger issues.   CSW reminded pt to call CSW if needs arise prior to CSW follow up call.   Eduard Clos, MSW, [place] Worker  [place] [phone]

## 2020-08-23 ENCOUNTER — Ambulatory Visit: Payer: Self-pay | Admitting: *Deleted

## 2020-08-23 ENCOUNTER — Other Ambulatory Visit: Payer: Self-pay | Admitting: *Deleted

## 2020-08-23 NOTE — Patient Outreach (Signed)
[place] [facility]) Care Management  08/23/2020  Blimie Vaness 1996-11-03 [id number]   CSW attempted to reach pt by phone today and was unable.  CSW was able to leave a HIPPA compliant voice message and will attempt outreach again in 3-4 business days if no return call is received.   Eduard Clos, MSW, [place] Worker  [place] [phone]

## 2020-08-26 ENCOUNTER — Ambulatory Visit: Payer: Self-pay | Admitting: *Deleted

## 2020-08-30 ENCOUNTER — Other Ambulatory Visit: Payer: Self-pay | Admitting: *Deleted

## 2020-08-30 NOTE — Patient Outreach (Signed)
[place] [facility]) Care Management  08/30/2020  Alajah Witman 1996/07/05 [id number]   Late Entry  CSW attempted to reach pt on 08/26/2020 and was unable to reach.  CSW will attempt again per policy if no return call is received.  Eduard Clos, MSW, [place] Worker  [place] [phone]

## 2020-08-31 ENCOUNTER — Ambulatory Visit: Payer: Self-pay | Admitting: *Deleted

## 2020-09-01 ENCOUNTER — Other Ambulatory Visit: Payer: Self-pay | Admitting: *Deleted

## 2020-09-01 NOTE — Patient Outreach (Signed)
[place] [facility]) Care Management  09/01/2020  Gabriel Paulding Apr 03, 1996 [id number]   CSW attempted to reach pt by phone on 08/31/2020 and was unable.  CSW left a HIPPA compliant voice mail message and will await callback or outreach again in 3-4 business days if no return call is received.   Eduard Clos, MSW, [place] Worker  [place] [phone]

## 2020-09-06 ENCOUNTER — Other Ambulatory Visit: Payer: Self-pay | Admitting: *Deleted

## 2020-09-06 ENCOUNTER — Ambulatory Visit: Payer: Self-pay | Admitting: *Deleted

## 2020-09-06 NOTE — Patient Outreach (Signed)
[place] [facility]) Care Management  09/06/2020  Ruth Daniels 1996/04/01 [id number]   CSW attempted to reach pt by phone for a 3rd attempt. CSW left a HIPPA compliant voice message and will plan to attempt outreach again in 30 days if no return call is received per policy.   Eduard Clos, MSW, [place] Worker  [place] [phone]

## 2020-10-05 ENCOUNTER — Other Ambulatory Visit: Payer: Self-pay | Admitting: *Deleted

## 2020-10-05 NOTE — Patient Outreach (Signed)
[place] [facility]) Care Management  10/05/2020  Ruth Daniels [birth year] [id number]   CSW spoke with pt who reports things are better; "falling into place" and she denies any current concerns or needs.  She has started back to school/taking classes and reports her baby is doing well also.  CSW discussed resources provided and discussed in previous conversations and she has no questions.   CSW offered to touch base again in a few weeks for follow up and further assessment of needs/support.   Eduard Clos, MSW, [place] Worker  [place] [phone]

## 2020-10-25 ENCOUNTER — Ambulatory Visit: Payer: Self-pay | Admitting: *Deleted

## 2020-10-26 ENCOUNTER — Other Ambulatory Visit: Payer: Self-pay | Admitting: *Deleted

## 2020-10-26 NOTE — Patient Outreach (Signed)
[place] [facility]) Care Management  10/26/2020  Lasundra Hascall October 25, 1996 [id number]   CSW attempted to reach pt for follow up and was unable to reach.  CSW left a HIPPA compliant voice message and will outreach again in 3-4 business days if no return call is received.   Eduard Clos, MSW, [place] Worker  [place] [phone]

## 2020-10-28 ENCOUNTER — Ambulatory Visit: Payer: Self-pay | Admitting: *Deleted

## 2020-11-02 ENCOUNTER — Other Ambulatory Visit: Payer: Self-pay | Admitting: *Deleted

## 2020-11-02 NOTE — Patient Outreach (Signed)
[place] [facility]) Care Management  11/02/2020  Ruth Daniels 1996-08-20 [id number]   CSW spoke with pt who reports things are "much better". Her baby will soon be one year old and she is feeling comfortable and balanced.  She denies any current mental health concerns; reporting this is much better and having support and things settling in place she does not feel the need for further intervention.  Pt did ask about community resources in and around her in the [place] area.  CSW provided pt with the contact # for [place] [facility] information line and will mail pt other resources as well.  CSW will sign off at this time. Pt agrees and will reach out if further needs arise.   CSW will advise PCP and [facility] team of above.   Eduard Clos, MSW, [place] Worker  [place] [phone]

## 2022-06-27 ENCOUNTER — Emergency Department (HOSPITAL_COMMUNITY)
Admission: EM | Admit: 2022-06-27 | Discharge: 2022-06-27 | Disposition: A | Discharge status: Home / Self Care | Payer: Medicaid Other | Attending: Emergency Medicine | Admitting: Emergency Medicine

## 2022-06-27 ENCOUNTER — Emergency Department (HOSPITAL_COMMUNITY): Payer: Medicaid Other

## 2022-06-27 ENCOUNTER — Encounter (HOSPITAL_COMMUNITY): Payer: Self-pay | Admitting: Emergency Medicine

## 2022-06-27 ENCOUNTER — Other Ambulatory Visit: Payer: Self-pay

## 2022-06-27 DIAGNOSIS — G8929 Other chronic pain: Secondary | ICD-10-CM | POA: Diagnosis not present

## 2022-06-27 DIAGNOSIS — Z91040 Latex allergy status: Secondary | ICD-10-CM | POA: Insufficient documentation

## 2022-06-27 DIAGNOSIS — X509XXA Other and unspecified overexertion or strenuous movements or postures, initial encounter: Secondary | ICD-10-CM | POA: Insufficient documentation

## 2022-06-27 DIAGNOSIS — M25561 Pain in right knee: Secondary | ICD-10-CM | POA: Insufficient documentation

## 2022-06-27 MED ORDER — NAPROXEN 500 MG PO TABS: 500.0000 mg | ORAL_TABLET | Freq: Two times a day (BID) | ORAL | 0 refills | Status: DC

## 2022-06-27 NOTE — ED Triage Notes (Signed)
Pt present with evaluation of chronic right knee pain.

## 2022-06-27 NOTE — Discharge Instructions (Signed)
Please take Naprosyn, '500mg'$  by mouth twice daily as needed for pain - this in an antiinflammatory medicine (NSAID) and is similar to ibuprofen - many people feel that it is stronger than ibuprofen and it is easier to take since it is a smaller pill.  Please use this only for 1 week - if your pain persists, you will need to follow up with your doctor in the office for ongoing guidance and pain control.  Thank you for allowing [place] to treat you in the emergency department today.  After reviewing your examination and potential testing that was done it appears that you are safe to go home.  I would like for you to follow-up with your doctor within the next several days, have them obtain your results and follow-up with them to review all of these tests.  If you should develop severe or worsening symptoms return to the emergency department immediately  Call the office of Dr. Aline Brochure to make an appointment to be seen within the next couple of weeks

## 2022-06-27 NOTE — ED Provider Notes (Signed)
[place] Provider Note   CSN: [id number] Arrival date & time: 06/27/22  1752     History  Chief Complaint  Patient presents with   Knee Pain    Ruth Daniels is a 26 y.o. female.   Knee Pain Associated symptoms: no fever     26 year old female, presents with increasing right-sided knee pain which has been present for a year, she wears a knee brace, she states that it hurts to walk on it sometimes there is a pop, she has had x-rays and orthopedic evaluation a year ago but it still hurts.  Home Medications Prior to Admission medications   Medication Sig Start Date End Date Taking? Authorizing Provider  naproxen (NAPROSYN) 500 MG tablet Take 1 tablet (500 mg total) by mouth 2 (two) times daily with a meal. 06/27/22  Yes Noemi Chapel, MD  acetaminophen (TYLENOL) 500 MG tablet Take 500 mg by mouth every 6 (six) hours as needed for mild pain or moderate pain.    [provider]  albuterol (PROAIR HFA) 108 (90 Base) MCG/ACT inhaler Inhale 1-2 puffs into the lungs every 6 (six) hours as needed for wheezing or shortness of breath.    [provider]  albuterol (PROVENTIL) (5 MG/ML) 0.5% nebulizer solution Take 0.5 mLs (2.5 mg total) by nebulization every 6 (six) hours as needed for wheezing or shortness of breath. 08/08/18   Fawze, Mina A, PA-C      Allergies    Bee venom, Latex, Mushroom extract complex, and Penicillins    Review of Systems   Review of Systems  Constitutional:  Negative for fever.  Skin:  Negative for wound.    Physical Exam Updated Vital Signs BP (!) 178/113 (BP Location: Right Arm)   Pulse (!) 52   Temp 98.2 F (36.8 C) (Oral)   Resp 18   LMP 06/25/2022 (Approximate)   SpO2 100%  Physical Exam Vitals and nursing note reviewed.  Constitutional:      Appearance: She is well-developed. She is not diaphoretic.  HENT:     Head: Normocephalic and atraumatic.  Eyes:     General:        Right eye: No discharge.         Left eye: No discharge.     Conjunctiva/sclera: Conjunctivae normal.  Pulmonary:     Effort: Pulmonary effort is normal. No respiratory distress.  Musculoskeletal:        General: No swelling, tenderness, deformity or signs of injury.     Right lower leg: No edema.     Left lower leg: No edema.     Comments: Minimal pain with range of motion of the knee, no clinical effusion, stable joint  Skin:    General: Skin is warm and dry.     Findings: No erythema or rash.  Neurological:     Mental Status: She is alert.     Coordination: Coordination normal.     ED Results / Procedures / Treatments   Labs (all labs ordered are listed, but only abnormal results are displayed) Labs Reviewed - No data to display  EKG None  Radiology DG Knee 2 Views Right  Result Date: 06/27/2022 CLINICAL DATA:  Chronic right knee pain EXAM: RIGHT KNEE - 1-2 VIEW COMPARISON:  None Available. FINDINGS: No evidence of fracture, dislocation, or joint effusion. No evidence of arthropathy or other focal bone abnormality. Soft tissues are unremarkable. IMPRESSION: No radiographic abnormality is seen in the right knee. Electronically Signed  By: Elmer Picker M.D.   On: 06/27/2022 19:06    Procedures Procedures    Medications Ordered in ED Medications - No data to display  ED Course/ Medical Decision Making/ A&P                           Medical Decision Making Amount and/or Complexity of Data Reviewed Radiology: ordered.  Risk Prescription drug management.   I personally viewed and interpreted the x-rays which show no signs of fractures, no effusion, her clinical exam is unremarkable, she is requesting a work note, will have her follow-up with local orthopedics.  Naprosyn will be ordered for home        Final Clinical Impression(s) / ED Diagnoses Final diagnoses:  Chronic pain of right knee    Rx / DC Orders ED Discharge Orders          Ordered    naproxen (NAPROSYN) 500  MG tablet  2 times daily with meals        06/27/22 2038              Noemi Chapel, MD 06/27/22 2041

## 2023-06-03 ENCOUNTER — Other Ambulatory Visit: Payer: Self-pay

## 2023-06-03 ENCOUNTER — Encounter (HOSPITAL_COMMUNITY): Payer: Self-pay | Admitting: *Deleted

## 2023-06-03 ENCOUNTER — Emergency Department (HOSPITAL_COMMUNITY)
Admission: EM | Admit: 2023-06-03 | Discharge: 2023-06-03 | Disposition: A | Discharge status: Home / Self Care | Payer: Medicaid Other | Attending: Emergency Medicine | Admitting: Emergency Medicine

## 2023-06-03 DIAGNOSIS — T7840XA Allergy, unspecified, initial encounter: Secondary | ICD-10-CM

## 2023-06-03 DIAGNOSIS — Z91040 Latex allergy status: Secondary | ICD-10-CM | POA: Diagnosis not present

## 2023-06-03 DIAGNOSIS — T620X1A Toxic effect of ingested mushrooms, accidental (unintentional), initial encounter: Secondary | ICD-10-CM | POA: Insufficient documentation

## 2023-06-03 MED ORDER — ALBUTEROL SULFATE HFA 108 (90 BASE) MCG/ACT IN AERS
2.0000 | INHALATION_SPRAY | Freq: Once | RESPIRATORY_TRACT | Status: AC
  Administered 2023-06-03: 2 via RESPIRATORY_TRACT
  Filled 2023-06-03: qty 6.7

## 2023-06-03 MED ORDER — FAMOTIDINE IN NACL 20-0.9 MG/50ML-% IV SOLN
20.0000 mg | Freq: Once | INTRAVENOUS | Status: AC
  Administered 2023-06-03: 20 mg via INTRAVENOUS
  Filled 2023-06-03: qty 50

## 2023-06-03 MED ORDER — EPINEPHRINE 0.3 MG/0.3ML IJ SOAJ: 0.3000 mg | INTRAMUSCULAR | 0 refills | Status: DC | PRN

## 2023-06-03 NOTE — ED Triage Notes (Signed)
Pt brought in by [facility] for c/o severe allergic reaction; pt states she ordered a sub and saw that it had mushrooms on it so she sent it back and when she got the sub back she took a bite and immediately had swelling to her throat and eyes  Ems gave 0.3mg  of epinephrine, benadryl 50mg  IV, solumedrol 125mg  IV and 2 albuterol neb treatments  Pt still has swelling to both eyes and some chest tightness but states she is not having difficulty breathing at this time  18g in [facility]

## 2023-06-03 NOTE — ED Provider Notes (Signed)
[place] EMERGENCY DEPARTMENT AT [facility] Provider Note   CSN: [id number] Arrival date & time: 06/03/23  1627     History  Chief Complaint  Patient presents with   Allergic Reaction    Ruth Daniels is a 27 y.o. female.   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  States she ordered a sandwich and had mushrooms on it and returned it.  States that they sent him back but must of just pulled a box from soft not given her new sandwich.  States had swelling.  Began around 330.  Given epinephrine Benadryl and Solu-Medrol along with albuterol by EMS.  Feeling somewhat better now.  States does feel itchy.  Was having some difficulty breathing initially but that is resolved.  Has had previous allergy to penicillin also that was a rash.    Past Medical History:  Diagnosis Date   Allergy    Penicillin   Chlamydia trachomatis infection in pregnancy    Eczema    Endometriosis    GBS (group B streptococcus) UTI complicating pregnancy 09/29/2013   PID (acute pelvic inflammatory disease)    PTSD (post-traumatic stress disorder)     Home Medications Prior to Admission medications   Medication Sig Start Date End Date Taking? Authorizing Provider  EPINEPHrine 0.3 mg/0.3 mL IJ SOAJ injection Inject 0.3 mg into the muscle as needed for anaphylaxis. 06/03/23  Yes Benjiman Core, MD  acetaminophen (TYLENOL) 500 MG tablet Take 500 mg by mouth every 6 (six) hours as needed for mild pain or moderate pain.    [provider]  albuterol (PROAIR HFA) 108 (90 Base) MCG/ACT inhaler Inhale 1-2 puffs into the lungs every 6 (six) hours as needed for wheezing or shortness of breath.    [provider]  albuterol (PROVENTIL) (5 MG/ML) 0.5% nebulizer solution Take 0.5 mLs (2.5 mg total) by nebulization every 6 (six) hours as needed for wheezing or shortness of breath. 08/08/18   Luevenia Maxin, Mina A, PA-C  naproxen (NAPROSYN) 500 MG tablet Take 1 tablet (500 mg  total) by mouth 2 (two) times daily with a meal. 06/27/22   Eber Hong, MD      Allergies    Bee venom, Latex, Mushroom extract complex, and Penicillins    Review of Systems   Review of Systems  Physical Exam Updated Vital Signs BP (!) 146/88 (BP Location: Right Arm)   Pulse 93   Temp 98.2 F (36.8 C) (Oral)   Resp 18   Ht 5' (1.524 m)   Wt 78 kg   LMP 05/13/2023 (Approximate)   SpO2 100%   BMI 33.59 kg/m  Physical Exam Vitals and nursing note reviewed.  Cardiovascular:     Rate and Rhythm: Regular rhythm.  Pulmonary:     Breath sounds: No wheezing or rhonchi.  Abdominal:     Tenderness: There is no abdominal tenderness.  Musculoskeletal:        General: No tenderness.     Cervical back: Neck supple.  Skin:    General: Skin is warm.     Capillary Refill: Capillary refill takes less than 2 seconds.  Neurological:     Mental Status: She is alert and oriented to person, place, and time.     ED Results / Procedures / Treatments   Labs (all labs ordered are listed, but only abnormal results are displayed) Labs Reviewed - No data to display  EKG None  Radiology No results found.  Procedures Procedures  Medications Ordered in ED Medications  famotidine (PEPCID) IVPB 20 mg premix (0 mg Intravenous Stopped 06/03/23 1930)  albuterol (VENTOLIN HFA) 108 (90 Base) MCG/ACT inhaler 2 puff (2 puffs Inhalation Given 06/03/23 2025)    ED Course/ Medical Decision Making/ A&P                             Medical Decision Making Risk Prescription drug management.   Patient with allergic reaction.  History of same.  Likely from mushrooms.  Has already had epinephrine.  Differential diagnosis includes anaphylaxis, allergic reaction.  Will monitor.  Will give Pepcid.  Feeling better after monitoring.  No further worsening.  Discharge home with prescription for EpiPen.  Also given albuterol for wheezing she has had.          Final Clinical Impression(s) / ED  Diagnoses Final diagnoses:  Allergic reaction, initial encounter    Rx / DC Orders ED Discharge Orders          Ordered    EPINEPHrine 0.3 mg/0.3 mL IJ SOAJ injection  As needed        06/03/23 2007              Benjiman Core, MD 06/03/23 2356

## 2023-08-12 ENCOUNTER — Emergency Department (HOSPITAL_COMMUNITY)
Admission: EM | Admit: 2023-08-12 | Discharge: 2023-08-12 | Disposition: A | Discharge status: Home / Self Care | Payer: MEDICAID | Attending: Emergency Medicine | Admitting: Emergency Medicine

## 2023-08-12 ENCOUNTER — Other Ambulatory Visit: Payer: Self-pay

## 2023-08-12 ENCOUNTER — Encounter (HOSPITAL_COMMUNITY): Payer: Self-pay | Admitting: Pharmacy Technician

## 2023-08-12 ENCOUNTER — Emergency Department (HOSPITAL_COMMUNITY): Payer: MEDICAID

## 2023-08-12 DIAGNOSIS — Z91040 Latex allergy status: Secondary | ICD-10-CM | POA: Diagnosis not present

## 2023-08-12 DIAGNOSIS — N939 Abnormal uterine and vaginal bleeding, unspecified: Secondary | ICD-10-CM | POA: Insufficient documentation

## 2023-08-12 DIAGNOSIS — R519 Headache, unspecified: Secondary | ICD-10-CM | POA: Insufficient documentation

## 2023-08-12 DIAGNOSIS — M549 Dorsalgia, unspecified: Secondary | ICD-10-CM | POA: Diagnosis not present

## 2023-08-12 LAB — URINALYSIS, ROUTINE W REFLEX MICROSCOPIC
Bacteria, UA: NONE SEEN
Bilirubin Urine: NEGATIVE
Glucose, UA: NEGATIVE mg/dL
Ketones, ur: 20 mg/dL — AB
Leukocytes,Ua: NEGATIVE
Nitrite: NEGATIVE
Protein, ur: 30 mg/dL — AB
RBC / HPF: >50 RBC/hpf (ref 0–5)
Specific Gravity, Urine: 1.031 — ABNORMAL HIGH (ref 1.005–1.030)
pH: 5 (ref 5.0–8.0)

## 2023-08-12 LAB — CBC WITH DIFFERENTIAL/PLATELET
Abs Immature Granulocytes: 0.02 10*3/uL (ref 0.00–0.07)
Basophils Absolute: 0 10*3/uL (ref 0.0–0.1)
Basophils Relative: 1 %
Eosinophils Absolute: 0 10*3/uL (ref 0.0–0.5)
Eosinophils Relative: 1 %
HCT: 39 % (ref 36.0–46.0)
Hemoglobin: 13.4 g/dL (ref 12.0–15.0)
Immature Granulocytes: 0 %
Lymphocytes Relative: 28 %
Lymphs Abs: 2.3 10*3/uL (ref 0.7–4.0)
MCH: 31.6 pg (ref 26.0–34.0)
MCHC: 34.4 g/dL (ref 30.0–36.0)
MCV: 92 fL (ref 80.0–100.0)
Monocytes Absolute: 0.6 10*3/uL (ref 0.1–1.0)
Monocytes Relative: 7 %
Neutro Abs: 5.3 10*3/uL (ref 1.7–7.7)
Neutrophils Relative %: 63 %
Platelets: 268 10*3/uL (ref 150–400)
RBC: 4.24 MIL/uL (ref 3.87–5.11)
RDW: 12.1 % (ref 11.5–15.5)
WBC: 8.3 10*3/uL (ref 4.0–10.5)
nRBC: 0 % (ref 0.0–0.2)

## 2023-08-12 LAB — BASIC METABOLIC PANEL
Anion gap: 10 (ref 5–15)
BUN: 14 mg/dL (ref 6–20)
CO2: 22 mmol/L (ref 22–32)
Calcium: 9 mg/dL (ref 8.9–10.3)
Chloride: 105 mmol/L (ref 98–111)
Creatinine, Ser: 0.61 mg/dL (ref 0.44–1.00)
GFR, Estimated: >60 mL/min (ref >60)
Glucose, Bld: 94 mg/dL (ref 70–99)
Potassium: 3.5 mmol/L (ref 3.5–5.1)
Sodium: 137 mmol/L (ref 135–145)

## 2023-08-12 LAB — WET PREP, GENITAL
Clue Cells Wet Prep HPF POC: NONE SEEN
Sperm: NONE SEEN
Trich, Wet Prep: NONE SEEN
WBC, Wet Prep HPF POC: <10 (ref <10)
Yeast Wet Prep HPF POC: NONE SEEN

## 2023-08-12 LAB — POC URINE PREG, ED: Preg Test, Ur: NEGATIVE

## 2023-08-12 MED ORDER — ONDANSETRON 4 MG PO TBDP
4.0000 mg | ORAL_TABLET | Freq: Once | ORAL | Status: AC | PRN
Start: 2023-08-12 — End: 2023-08-12
  Administered 2023-08-12: 4 mg via ORAL
  Filled 2023-08-12: qty 1

## 2023-08-12 NOTE — Discharge Instructions (Signed)
You may take over-the-counter Tylenol and/or ibuprofen as directed if needed for cramping.  Warm heating pad to your lower abdomen may help as well.  Please follow-up with your OB/GYN for recheck.

## 2023-08-12 NOTE — ED Notes (Signed)
Patient transported to Ultrasound 

## 2023-08-12 NOTE — ED Triage Notes (Signed)
Pt reports dull back pain, headache, nausea and abdominal cramping. Period started yesterday.

## 2023-08-12 NOTE — ED Provider Notes (Signed)
[facility] EMERGENCY DEPARTMENT AT [facility] Provider Note   CSN: [id number] Arrival date & time: 08/12/23  4098     History {Add pertinent medical, surgical, social history, OB history to HPI:1} Chief Complaint  Patient presents with   Vaginal Bleeding    Ruth Daniels is a 27 y.o. female.   Vaginal Bleeding Associated symptoms: back pain and nausea   Associated symptoms: no abdominal pain, no dysuria and no fever        Ruth Daniels is a 27 y.o. female who presents to the Emergency Department complaining of lower abdominal cramping, nausea, headache, back pain, and vaginal bleeding.  States her vaginal bleeding began yesterday but other symptoms began nearly 1 week ago.  States that her blood flow is heavier this week than usual.  Denies fever, vomiting, dysuria.  Does not currently use any form of birth control, but does not believe that she is pregnant.  Home Medications Prior to Admission medications   Medication Sig Start Date End Date Taking? Authorizing Provider  acetaminophen (TYLENOL) 500 MG tablet Take 500 mg by mouth every 6 (six) hours as needed for mild pain or moderate pain.    [provider]  albuterol (PROAIR HFA) 108 (90 Base) MCG/ACT inhaler Inhale 1-2 puffs into the lungs every 6 (six) hours as needed for wheezing or shortness of breath.    [provider]  albuterol (PROVENTIL) (5 MG/ML) 0.5% nebulizer solution Take 0.5 mLs (2.5 mg total) by nebulization every 6 (six) hours as needed for wheezing or shortness of breath. 08/08/18   Fawze, Mina A, PA-C  EPINEPHrine 0.3 mg/0.3 mL IJ SOAJ injection Inject 0.3 mg into the muscle as needed for anaphylaxis. 06/03/23   Benjiman Core, MD  naproxen (NAPROSYN) 500 MG tablet Take 1 tablet (500 mg total) by mouth 2 (two) times daily with a meal. 06/27/22   Eber Hong, MD      Allergies    Bee venom, Latex, Mushroom extract complex, and Penicillins    Review of Systems    Review of Systems  Constitutional:  Negative for appetite change, chills and fever.  Eyes:  Negative for visual disturbance.  Respiratory:  Negative for shortness of breath.   Cardiovascular:  Negative for chest pain.  Gastrointestinal:  Positive for nausea. Negative for abdominal pain and vomiting.  Genitourinary:  Positive for menstrual problem, pelvic pain and vaginal bleeding. Negative for difficulty urinating, dysuria and flank pain.  Musculoskeletal:  Positive for back pain.  Skin:  Negative for rash.  Neurological:  Positive for headaches. Negative for syncope, weakness and numbness.    Physical Exam Updated Vital Signs BP (!) 143/101   Pulse 87   Temp 97.8 F (36.6 C)   Resp 16   SpO2 97%  Physical Exam Vitals and nursing note reviewed. Exam conducted with a chaperone present.  Constitutional:      General: She is not in acute distress.    Appearance: Normal appearance. She is not ill-appearing or toxic-appearing.  HENT:     Mouth/Throat:     Mouth: Mucous membranes are moist.  Cardiovascular:     Rate and Rhythm: Normal rate and regular rhythm.     Pulses: Normal pulses.  Pulmonary:     Effort: Pulmonary effort is normal.  Abdominal:     Palpations: Abdomen is soft.     Tenderness: There is abdominal tenderness. There is no right CVA tenderness or left CVA tenderness.     Comments: Diffuse tenderness palpation  lower abdomen.  Abdomen is soft, no guarding or rebound tenderness.  Genitourinary:    Labia:        Right: No rash or tenderness.        Left: No rash or tenderness.      Cervix: Cervical bleeding present. No cervical motion tenderness.     Uterus: Not enlarged and not tender.      Adnexa: Right adnexa normal and left adnexa normal.       Right: No mass or tenderness.         Left: No mass or tenderness.       Comments: Blood within the vaginal vault, no clots seen on exam.  No palpable adnexal tenderness or masses.  Musculoskeletal:        General:  Normal range of motion.     Right lower leg: No edema.  Skin:    General: Skin is warm.     Capillary Refill: Capillary refill takes less than 2 seconds.  Neurological:     General: No focal deficit present.     Mental Status: She is alert.     Sensory: No sensory deficit.     Motor: No weakness.     ED Results / Procedures / Treatments   Labs (all labs ordered are listed, but only abnormal results are displayed) Labs Reviewed  URINALYSIS, ROUTINE W REFLEX MICROSCOPIC - Abnormal; Notable for the following components:      Result Value   APPearance HAZY (*)    Specific Gravity, Urine 1.031 (*)    Hgb urine dipstick LARGE (*)    Ketones, ur 20 (*)    Protein, ur 30 (*)    All other components within normal limits  WET PREP, GENITAL  CBC WITH DIFFERENTIAL/PLATELET  BASIC METABOLIC PANEL  RPR  POC URINE PREG, ED  GC/CHLAMYDIA PROBE AMP ([place]) NOT AT [facility]    EKG None  Radiology US PELVIC COMPLETE W TRANSVAGINAL AND TORSION R/O  Result Date: 08/12/2023 CLINICAL DATA:  Pelvic pain EXAM: TRANSABDOMINAL AND TRANSVAGINAL ULTRASOUND OF PELVIS DOPPLER ULTRASOUND OF OVARIES TECHNIQUE: Both transabdominal and transvaginal ultrasound examinations of the pelvis were performed. Transabdominal technique was performed for global imaging of the pelvis including uterus, ovaries, adnexal regions, and pelvic cul-de-sac. It was necessary to proceed with endovaginal exam following the transabdominal exam to visualize the endometrium and ovaries. Color and duplex Doppler ultrasound was utilized to evaluate blood flow to the ovaries. COMPARISON:  07/19/2021 FINDINGS: Uterus Measurements: 10.5 x 4.8 X 6.1 cm = volume: 163.4 mL. No fibroids or other mass visualized. Endometrium Thickness: 3 mm.  No focal abnormality visualized. Right ovary Measurements: 2.5 x 1.9 x 1.9 cm = volume: 4.62 mL. Normal appearance/no adnexal mass. Left ovary Measurements: 2.9 x 2.3 x 2.9 cm = volume: 10.55 mL. Normal  appearance/no adnexal mass. Pulsed Doppler evaluation of both ovaries demonstrates normal low-resistance arterial and venous waveforms. Other findings No abnormal free fluid. IMPRESSION: No sonographic abnormalities are seen in pelvis. There is no evidence of ovarian torsion. No adnexal masses are seen. Electronically Signed   By: Ernie Avena M.D.   On: 08/12/2023 12:36    Procedures Procedures  {Document cardiac monitor, telemetry assessment procedure when appropriate:1}  Medications Ordered in ED Medications  ondansetron (ZOFRAN-ODT) disintegrating tablet 4 mg (4 mg Oral Given 08/12/23 1040)    ED Course/ Medical Decision Making/ A&P   {   Click here for ABCD2, HEART and other calculatorsREFRESH Note before signing :1}  Medical Decision Making Patient here with symptoms of frontal headache, nausea, abdominal cramping and back pain.  Symptoms present for nearly 1 week.  Began menses yesterday.  States her bleeding is heavier than normal.  Does not use any form of birth control denies any new medications or dysuria symptoms.  No excessive or abnormal vaginal discharge.  Differential would include but not limited to pregnancy, STI, UTI, ovarian torsion, ectopic pregnancy, TOA also considered.  Amount and/or Complexity of Data Reviewed Labs: ordered.    Details: Labs overall reassuring.  No evidence of leukocytosis or anemia.  Chemistries without derangement, urine pregnancy test is negative.  Urinalysis shows large blood without leukocytes nitrites or bacteria. Radiology: ordered.    Details: Ultrasound pelvis with transvaginal torsion rule out no sonographic abnormalities of the pelvis or evidence of ovarian torsion.  No adnexal masses.  Wet prep reassuring, GC chlamydia pending Discussion of management or test interpretation with external provider(s): Patient here with heavy uterine bleeding.  Labs reassuring.  No evidence of ovarian torsion.  She appears  appropriate for discharge home at this time.  Agreeable to symptomatic treatment with Tylenol and/or ibuprofen if needed for pain along with heating pad.  She will follow-up closely with her OB/GYN for recheck.  Return precautions were discussed.  Risk Prescription drug management.     {Document critical care time when appropriate:1} {Document review of labs and clinical decision tools ie heart score, Chads2Vasc2 etc:1}  {Document your independent review of radiology images, and any outside records:1} {Document your discussion with family members, caretakers, and with consultants:1} {Document social determinants of health affecting pt's care:1} {Document your decision making why or why not admission, treatments were needed:1} Final Clinical Impression(s) / ED Diagnoses Final diagnoses:  None    Rx / DC Orders ED Discharge Orders     None

## 2023-08-12 NOTE — ED Triage Notes (Signed)
Pt bib ems with reports of menstrual cycle onset yesterday is heavier than normal.

## 2023-09-07 ENCOUNTER — Encounter (HOSPITAL_COMMUNITY): Payer: Self-pay

## 2023-09-07 ENCOUNTER — Emergency Department (HOSPITAL_COMMUNITY): Payer: MEDICAID

## 2023-09-07 ENCOUNTER — Other Ambulatory Visit: Payer: Self-pay

## 2023-09-07 ENCOUNTER — Emergency Department (HOSPITAL_COMMUNITY)
Admission: EM | Admit: 2023-09-07 | Discharge: 2023-09-07 | Disposition: A | Discharge status: Home / Self Care | Payer: MEDICAID | Attending: Emergency Medicine | Admitting: Emergency Medicine

## 2023-09-07 DIAGNOSIS — R0789 Other chest pain: Secondary | ICD-10-CM | POA: Insufficient documentation

## 2023-09-07 DIAGNOSIS — D1800 Hemangioma unspecified site: Secondary | ICD-10-CM

## 2023-09-07 DIAGNOSIS — Z91040 Latex allergy status: Secondary | ICD-10-CM | POA: Diagnosis not present

## 2023-09-07 DIAGNOSIS — R079 Chest pain, unspecified: Secondary | ICD-10-CM | POA: Diagnosis present

## 2023-09-07 LAB — CBC
HCT: 32.6 % — ABNORMAL LOW (ref 36.0–46.0)
Hemoglobin: 11.1 g/dL — ABNORMAL LOW (ref 12.0–15.0)
MCH: 31.4 pg (ref 26.0–34.0)
MCHC: 34 g/dL (ref 30.0–36.0)
MCV: 92.4 fL (ref 80.0–100.0)
Platelets: 253 10*3/uL (ref 150–400)
RBC: 3.53 MIL/uL — ABNORMAL LOW (ref 3.87–5.11)
RDW: 12.5 % (ref 11.5–15.5)
WBC: 6.3 10*3/uL (ref 4.0–10.5)
nRBC: 0 % (ref 0.0–0.2)

## 2023-09-07 LAB — BASIC METABOLIC PANEL
Anion gap: 4 — ABNORMAL LOW (ref 5–15)
BUN: 10 mg/dL (ref 6–20)
CO2: 25 mmol/L (ref 22–32)
Calcium: 8.3 mg/dL — ABNORMAL LOW (ref 8.9–10.3)
Chloride: 107 mmol/L (ref 98–111)
Creatinine, Ser: 0.56 mg/dL (ref 0.44–1.00)
GFR, Estimated: >60 mL/min (ref >60)
Glucose, Bld: 84 mg/dL (ref 70–99)
Potassium: 3.4 mmol/L — ABNORMAL LOW (ref 3.5–5.1)
Sodium: 136 mmol/L (ref 135–145)

## 2023-09-07 LAB — POC URINE PREG, ED: Preg Test, Ur: NEGATIVE

## 2023-09-07 LAB — TROPONIN I (HIGH SENSITIVITY)
Troponin I (High Sensitivity): <2 ng/L (ref <18)
Troponin I (High Sensitivity): 2 ng/L (ref <18)

## 2023-09-07 MED ORDER — LORAZEPAM 0.5 MG PO TABS
0.5000 mg | ORAL_TABLET | Freq: Once | ORAL | Status: AC | PRN
  Administered 2023-09-07: 0.5 mg via ORAL
  Filled 2023-09-07: qty 1

## 2023-09-07 MED ORDER — GADOBUTROL 1 MMOL/ML IV SOLN
10.0000 mL | Freq: Once | INTRAVENOUS | Status: AC | PRN
  Administered 2023-09-07: 10 mL via INTRAVENOUS

## 2023-09-07 NOTE — ED Triage Notes (Signed)
Pt reports she just got to work after drinking coffee and began having central chest pain. Chest pain stopped then pt began having left tingling in her arm and legs. Pt stated she walked into facility but her body felt weird. No n/v and no cardiac hx.

## 2023-09-07 NOTE — ED Notes (Signed)
Pt verbalized her history is incorrect.

## 2023-09-07 NOTE — Discharge Instructions (Addendum)
The workup in the emergency room reveals no evidence of heart attack. 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  This condition appears to be stable, but we recommend that you follow-up with neurosurgery so that they can monitor it.

## 2023-09-07 NOTE — ED Notes (Signed)
Pt ambulated to restroom and gave a urine sample.

## 2023-09-07 NOTE — ED Provider Notes (Signed)
[place] EMERGENCY DEPARTMENT AT [facility] Provider Note   CSN: [id number] Arrival date & time: 09/07/23  5366     History  Chief Complaint  Patient presents with   Chest Pain    Ruth Daniels is a 27 y.o. female.  HPI    27 year old female comes in with chief complaint of left-sided chest pain and also left upper and lower extremity tingling sensation.  Patient has no significant past medical history.  She does however have a sister who died of a heart attack at age 81.  Patient states that today she started having central chest pain, described as pressure and heaviness.  With that she started having left upper extremity tingling.  She is currently also having tingling in her left lower extremity.  Patient waves.  She denies any substance use disorder.  Home Medications Prior to Admission medications   Medication Sig Start Date End Date Taking? Authorizing Provider  acetaminophen (TYLENOL) 500 MG tablet Take 500 mg by mouth every 6 (six) hours as needed for mild pain or moderate pain.    [provider]  albuterol (PROAIR HFA) 108 (90 Base) MCG/ACT inhaler Inhale 1-2 puffs into the lungs every 6 (six) hours as needed for wheezing or shortness of breath.    [provider]  albuterol (PROVENTIL) (5 MG/ML) 0.5% nebulizer solution Take 0.5 mLs (2.5 mg total) by nebulization every 6 (six) hours as needed for wheezing or shortness of breath. 08/08/18   Fawze, Mina A, PA-C  EPINEPHrine 0.3 mg/0.3 mL IJ SOAJ injection Inject 0.3 mg into the muscle as needed for anaphylaxis. 06/03/23   Benjiman Core, MD  naproxen (NAPROSYN) 500 MG tablet Take 1 tablet (500 mg total) by mouth 2 (two) times daily with a meal. 06/27/22   Eber Hong, MD      Allergies    Bee venom, Latex, Mushroom extract complex, and Penicillins    Review of Systems   Review of Systems  All other systems reviewed and are negative.   Physical Exam Updated Vital Signs BP (!)  140/94   Pulse (!) 55   Temp 98 F (36.7 C) (Oral)   Resp 14   Ht 5' (1.524 m)   Wt 90.7 kg   LMP 08/11/2023 (Exact Date)   SpO2 100%   BMI 39.06 kg/m  Physical Exam Vitals and nursing note reviewed.  Constitutional:      Appearance: She is well-developed.  HENT:     Head: Atraumatic.  Cardiovascular:     Rate and Rhythm: Normal rate.  Pulmonary:     Effort: Pulmonary effort is normal.  Musculoskeletal:     Cervical back: Normal range of motion and neck supple.  Skin:    General: Skin is warm and dry.  Neurological:     Mental Status: She is alert and oriented to person, place, and time.     Cranial Nerves: No cranial nerve deficit.     Comments: Subjective tingling sensation to the left upper and lower extremity.  Patient also noted to have 4+ out of 5 bilateral upper and lower extremity strength, but the left leg appeared slightly weak.     ED Results / Procedures / Treatments   Labs (all labs ordered are listed, but only abnormal results are displayed) Labs Reviewed  BASIC METABOLIC PANEL - Abnormal; Notable for the following components:      Result Value   Potassium 3.4 (*)    Calcium 8.3 (*)    Anion  gap 4 (*)    All other components within normal limits  CBC - Abnormal; Notable for the following components:   RBC 3.53 (*)    Hemoglobin 11.1 (*)    HCT 32.6 (*)    All other components within normal limits  POC URINE PREG, ED  TROPONIN I (HIGH SENSITIVITY)  TROPONIN I (HIGH SENSITIVITY)    EKG EKG Interpretation Date/Time:  Saturday September 07 2023 07:41:47 EDT Ventricular Rate:  72 PR Interval:  158 QRS Duration:  83 QT Interval:  370 QTC Calculation: 405 R Axis:   82  Text Interpretation: Sinus rhythm No acute changes No significant change since last tracing Confirmed by Derwood Kaplan [phone]) on 09/07/2023 9:06:46 AM  Radiology MR BRAIN W WO CONTRAST  Result Date: 09/07/2023 CLINICAL DATA:  Neuro deficit, acute, stroke suspected EXAM: MRI HEAD  WITHOUT AND WITH CONTRAST TECHNIQUE: Multiplanar, multiecho pulse sequences of the brain and surrounding structures were obtained without and with intravenous contrast. CONTRAST:  10mL GADAVIST GADOBUTROL 1 MMOL/ML IV SOLN COMPARISON:  Brain MR 12/31/14 FINDINGS: Brain: Negative for an acute infarct. No hemorrhage. No hydrocephalus. No extra-axial fluid collection. There is a 1.5 x 1.3 cm likely intra-axial lesion along the right frontal convexity (series 11, image 24), which demonstrates mild contrast enhancement. This may represent a cavernoma and is unchanged comapred to 12/31/14. Vascular: Normal flow voids. Skull and upper cervical spine: Normal marrow signal. Sinuses/Orbits: No middle ear or mastoid effusion. Paranasal sinuses are clear. Orbits are unremarkable. Other: None. IMPRESSION: 1. No acute intracranial process. 2. 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 Electronically Signed   By: Lorenza Cambridge M.D.   On: 09/07/2023 11:59   DG Chest 2 View  Result Date: 09/07/2023 CLINICAL DATA:  27 year old female with history of central chest pain. Left arm tingling. EXAM: CHEST - 2 VIEW COMPARISON:  Chest x-ray 06/23/2023. FINDINGS: Lung volumes are normal. No consolidative airspace disease. Trace bilateral pleural effusions. No pneumothorax. No pulmonary nodule or mass noted. Pulmonary vasculature and the cardiomediastinal silhouette are within normal limits. IMPRESSION: 1. Trace bilateral pleural effusions. Electronically Signed   By: Trudie Reed M.D.   On: 09/07/2023 08:40    Procedures Procedures    Medications Ordered in ED Medications  LORazepam (ATIVAN) tablet 0.5 mg (0.5 mg Oral Given 09/07/23 0949)  gadobutrol (GADAVIST) 1 MMOL/ML injection 10 mL (10 mLs Intravenous Contrast Given 09/07/23 1057)    ED Course/ Medical Decision Making/ A&P                                 Medical Decision Making Amount and/or Complexity of Data Reviewed Labs: ordered. Radiology:  ordered.  Risk Prescription drug management.   This patient presents to the ED with chief complaint(s) of left-sided chest pain, left sided tingling with pertinent past medical history of family history of premature CAD.I have also reviewed patient's chart, she had an MRI in 2016 that revealed cavernoma . the complaint involves an extensive differential diagnosis and also carries with it a high risk of complications and morbidity.    The differential diagnosis includes : Acute bleed secondary to cerebral cavernoma, worsening cavernoma, acute coronary syndrome, musculoskeletal pain, anxiety/panic attack, acute ischemic stroke.  The initial plan is to get basic labs including delta troponin and MRI brain without contrast.   Additional history obtained: Records reviewed  previous imaging including MRI of 2016  Independent labs  interpretation:  The following labs were independently interpreted: Troponins x 2 are reassuring.  MI is effectively ruled out in the ER.  Independent visualization and interpretation of imaging: - I independently visualized the following imaging with scope of interpretation limited to determining acute life threatening conditions related to emergency care: MRI of the brain, which revealed irregularity in the right frontal side, that radiologist reads as stable cavernoma.  Treatment and Reassessment:  The patient appears reasonably screened and/or stabilized for discharge and I doubt any other medical condition or other [facility] requiring further screening, evaluation, or treatment in the ED at this time prior to discharge.   Results from the ER workup discussed with the patient face to face and all questions answered to the best of my ability. The patient is safe for discharge with strict return precautions.     Final Clinical Impression(s) / ED Diagnoses Final diagnoses:  Cavernoma    Rx / DC Orders ED Discharge Orders     None         Derwood Kaplan,  MD 09/07/23 1215

## 2023-09-19 ENCOUNTER — Emergency Department (HOSPITAL_COMMUNITY): Payer: MEDICAID

## 2023-09-19 ENCOUNTER — Encounter (HOSPITAL_COMMUNITY): Payer: Self-pay | Admitting: Emergency Medicine

## 2023-09-19 ENCOUNTER — Emergency Department (HOSPITAL_COMMUNITY)
Admission: EM | Admit: 2023-09-19 | Discharge: 2023-09-19 | Disposition: A | Discharge status: Home / Self Care | Payer: MEDICAID | Attending: Emergency Medicine | Admitting: Emergency Medicine

## 2023-09-19 ENCOUNTER — Other Ambulatory Visit: Payer: Self-pay

## 2023-09-19 DIAGNOSIS — Y92410 Unspecified street and highway as the place of occurrence of the external cause: Secondary | ICD-10-CM | POA: Insufficient documentation

## 2023-09-19 DIAGNOSIS — Z91040 Latex allergy status: Secondary | ICD-10-CM | POA: Insufficient documentation

## 2023-09-19 DIAGNOSIS — M542 Cervicalgia: Secondary | ICD-10-CM | POA: Diagnosis present

## 2023-09-19 MED ORDER — HYDROCODONE-ACETAMINOPHEN 5-325 MG PO TABS
2.0000 | ORAL_TABLET | Freq: Once | ORAL | Status: AC
  Administered 2023-09-19: 2 via ORAL
  Filled 2023-09-19: qty 2

## 2023-09-19 MED ORDER — METHOCARBAMOL 500 MG PO TABS: 500.0000 mg | ORAL_TABLET | Freq: Four times a day (QID) | ORAL | 0 refills | Status: DC

## 2023-09-19 MED ORDER — IBUPROFEN 800 MG PO TABS: 800.0000 mg | ORAL_TABLET | Freq: Three times a day (TID) | ORAL | 0 refills | Status: DC | PRN

## 2023-09-19 NOTE — Discharge Instructions (Addendum)
Return if any problems.
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
## 2023-10-18 ENCOUNTER — Other Ambulatory Visit: Payer: Self-pay

## 2023-10-18 ENCOUNTER — Emergency Department (HOSPITAL_COMMUNITY)
Admission: EM | Admit: 2023-10-18 | Discharge: 2023-10-18 | Disposition: A | Discharge status: Home / Self Care | Payer: MEDICAID | Attending: Emergency Medicine | Admitting: Emergency Medicine

## 2023-10-18 ENCOUNTER — Encounter (HOSPITAL_COMMUNITY): Payer: Self-pay | Admitting: *Deleted

## 2023-10-18 DIAGNOSIS — Z91040 Latex allergy status: Secondary | ICD-10-CM | POA: Insufficient documentation

## 2023-10-18 DIAGNOSIS — R42 Dizziness and giddiness: Secondary | ICD-10-CM | POA: Diagnosis not present

## 2023-10-18 DIAGNOSIS — Z3A01 Less than 8 weeks gestation of pregnancy: Secondary | ICD-10-CM | POA: Insufficient documentation

## 2023-10-18 DIAGNOSIS — O26891 Other specified pregnancy related conditions, first trimester: Secondary | ICD-10-CM | POA: Diagnosis present

## 2023-10-18 DIAGNOSIS — R519 Headache, unspecified: Secondary | ICD-10-CM | POA: Insufficient documentation

## 2023-10-18 DIAGNOSIS — R110 Nausea: Secondary | ICD-10-CM | POA: Diagnosis not present

## 2023-10-18 LAB — URINALYSIS, ROUTINE W REFLEX MICROSCOPIC
Bilirubin Urine: NEGATIVE
Glucose, UA: NEGATIVE mg/dL
Ketones, ur: 80 mg/dL — AB
Leukocytes,Ua: NEGATIVE
Nitrite: NEGATIVE
Protein, ur: 30 mg/dL — AB
Specific Gravity, Urine: 1.023 (ref 1.005–1.030)
pH: 5 (ref 5.0–8.0)

## 2023-10-18 LAB — PREGNANCY, URINE: Preg Test, Ur: POSITIVE — AB

## 2023-10-18 MED ORDER — METOCLOPRAMIDE HCL 10 MG PO TABS: 10.0000 mg | ORAL_TABLET | Freq: Once | ORAL | Status: DC

## 2023-10-18 MED ORDER — ACETAMINOPHEN 325 MG PO TABS
650.0000 mg | ORAL_TABLET | Freq: Once | ORAL | Status: AC
  Administered 2023-10-18: 650 mg via ORAL
  Filled 2023-10-18: qty 2

## 2023-10-18 MED ORDER — ONDANSETRON 4 MG PO TBDP: 4.0000 mg | ORAL_TABLET | Freq: Three times a day (TID) | ORAL | 0 refills | Status: DC | PRN

## 2023-10-18 NOTE — ED Provider Notes (Signed)
[place] EMERGENCY DEPARTMENT AT [facility] Provider Note   CSN: [id number] Arrival date & time: 10/18/23  1501     History  Chief Complaint  Patient presents with   Dizziness    Ruth Daniels is a 27 y.o. female.   Dizziness  This patient is a 27 year old female, she presents to the hospital with a complaint of headaches and nausea for the last 2 weeks.  The patient states that she has missed a menstrual cycle, she did not take a pregnancy test because she was nervous and worried that she might be pregnant, she has not had any diarrhea coughing chest pain shortness of breath blurred vision numbness weakness difficulty with walking or breathing or eating.  She has been taking Aleve    Home Medications Prior to Admission medications   Medication Sig Start Date End Date Taking? Authorizing Provider  ondansetron (ZOFRAN-ODT) 4 MG disintegrating tablet Take 1 tablet (4 mg total) by mouth every 8 (eight) hours as needed for nausea. 10/18/23  Yes Eber Hong, MD  acetaminophen (TYLENOL) 500 MG tablet Take 500 mg by mouth every 6 (six) hours as needed for mild pain or moderate pain.    [provider]  albuterol (PROAIR HFA) 108 (90 Base) MCG/ACT inhaler Inhale 1-2 puffs into the lungs every 6 (six) hours as needed for wheezing or shortness of breath.    [provider]  albuterol (PROVENTIL) (5 MG/ML) 0.5% nebulizer solution Take 0.5 mLs (2.5 mg total) by nebulization every 6 (six) hours as needed for wheezing or shortness of breath. 08/08/18   Fawze, Mina A, PA-C  EPINEPHrine 0.3 mg/0.3 mL IJ SOAJ injection Inject 0.3 mg into the muscle as needed for anaphylaxis. 06/03/23   Benjiman Core, MD  ibuprofen (ADVIL) 800 MG tablet Take 1 tablet (800 mg total) by mouth every 8 (eight) hours as needed. 09/19/23   Elson Areas, PA-C  methocarbamol (ROBAXIN) 500 MG tablet Take 1 tablet (500 mg total) by mouth 4 (four) times daily. 09/19/23   Elson Areas,  PA-C  naproxen (NAPROSYN) 500 MG tablet Take 1 tablet (500 mg total) by mouth 2 (two) times daily with a meal. 06/27/22   Eber Hong, MD      Allergies    Bee venom, Latex, Mushroom extract complex, and Penicillins    Review of Systems   Review of Systems  Neurological:  Positive for dizziness.  All other systems reviewed and are negative.   Physical Exam Updated Vital Signs BP (!) 155/105 (BP Location: Right Arm)   Pulse 92   Temp 98.6 F (37 C) (Oral)   Resp 16   Ht 1.549 m (5\' 1" )   Wt 78 kg   LMP 09/07/2023   SpO2 95%   BMI 32.50 kg/m  Physical Exam Vitals and nursing note reviewed.  Constitutional:      General: She is not in acute distress.    Appearance: She is well-developed.  HENT:     Head: Normocephalic and atraumatic.     Mouth/Throat:     Pharynx: No oropharyngeal exudate.  Eyes:     General: No scleral icterus.       Right eye: No discharge.        Left eye: No discharge.     Conjunctiva/sclera: Conjunctivae normal.     Pupils: Pupils are equal, round, and reactive to light.  Neck:     Thyroid: No thyromegaly.     Vascular: No JVD.  Cardiovascular:  Rate and Rhythm: Normal rate and regular rhythm.     Heart sounds: Normal heart sounds. No murmur heard.    No friction rub. No gallop.  Pulmonary:     Effort: Pulmonary effort is normal. No respiratory distress.     Breath sounds: Normal breath sounds. No wheezing or rales.  Abdominal:     General: Bowel sounds are normal. There is no distension.     Palpations: Abdomen is soft. There is no mass.     Tenderness: There is no abdominal tenderness.  Musculoskeletal:        General: No tenderness. Normal range of motion.     Cervical back: Normal range of motion and neck supple.  Lymphadenopathy:     Cervical: No cervical adenopathy.  Skin:    General: Skin is warm and dry.     Findings: No erythema or rash.  Neurological:     Mental Status: She is alert.     Coordination: Coordination  normal.     Comments: Speech is clear, cranial nerves III through XII are intact, memory is intact, strength is normal in all 4 extremities including grips and strength at the bilateral thighs, knees and ankles to extention and flexion, sensation is intact to light touch and pinprick in all 4 extremities. Coordination as tested by finger-nose-finger is normal, no limb ataxia. Normal gait, normal reflexes at the patellar tendons bilaterally  Psychiatric:        Behavior: Behavior normal.     ED Results / Procedures / Treatments   Labs (all labs ordered are listed, but only abnormal results are displayed) Labs Reviewed  URINALYSIS, ROUTINE W REFLEX MICROSCOPIC - Abnormal; Notable for the following components:      Result Value   APPearance HAZY (*)    Hgb urine dipstick SMALL (*)    Ketones, ur 80 (*)    Protein, ur 30 (*)    Bacteria, UA RARE (*)    All other components within normal limits  PREGNANCY, URINE - Abnormal; Notable for the following components:   Preg Test, Ur POSITIVE (*)    All other components within normal limits    EKG None  Radiology No results found.  Procedures Procedures    Medications Ordered in ED Medications  acetaminophen (TYLENOL) tablet 650 mg (650 mg Oral Given 10/18/23 1537)    ED Course/ Medical Decision Making/ A&P                                 Medical Decision Making Amount and/or Complexity of Data Reviewed Labs: ordered.  Risk OTC drugs. Prescription drug management.   Totally normal exam both neurologically as well as her head exam with no tenderness over the sinuses, pharynx is clear, nasal passages are clear, neck is supple, no lymphadenopathy.  She is pregnant, she will be advised that Tylenol is the best medicine, she will be given some antiemetics for home, she can follow-up with a gynecologist.  Patient agreeable  Pregnancy positive, no significant UTI, will prescribe Zofran, have take Tylenol, patient stable for  discharge, she can follow-up in the outpatient setting, blood pressure slightly elevated, she does not meet criteria for preeclampsia given that she is newly pregnant        Final Clinical Impression(s) / ED Diagnoses Final diagnoses:  Less than [redacted] weeks gestation of pregnancy    Rx / DC Orders ED Discharge Orders  Ordered    ondansetron (ZOFRAN-ODT) 4 MG disintegrating tablet  Every 8 hours PRN        10/18/23 1557              Eber Hong, MD 10/18/23 1558

## 2023-10-18 NOTE — ED Triage Notes (Signed)
Pt with c/o dizziness, HA and emesis for past 2 weeks. Denies any sick contacts.

## 2023-10-18 NOTE — Discharge Instructions (Signed)
You are newly, you do not have a urinary infection, I would recommend that you drink plenty of clear liquids, if you need something for nausea because of severe nausea or vomiting you may take Zofran  Zofran is a medication which can help with nausea.  You may take 4 mg by mouth every 6 hours as needed if you are an adult, if your child under the age of 6 take half of a tablet or 2 mg every 6 hours as needed.  This should dissolve on your tongue within a short timeframe.  Wait about 30 minutes after taking it to help with drinking clear liquids.  Tylenol is the only safe pain medication in pregnancy, take up to 650 mg every 6 hours as needed.  See the phone number attached for the local gynecologist, you can follow-up with them in the office, call today to make an appointment

## 2023-10-20 ENCOUNTER — Emergency Department (HOSPITAL_COMMUNITY)
Admission: EM | Admit: 2023-10-20 | Discharge: 2023-10-20 | Disposition: A | Discharge status: Home / Self Care | Payer: MEDICAID | Attending: Emergency Medicine | Admitting: Emergency Medicine

## 2023-10-20 ENCOUNTER — Emergency Department (HOSPITAL_COMMUNITY): Payer: MEDICAID

## 2023-10-20 ENCOUNTER — Other Ambulatory Visit: Payer: Self-pay

## 2023-10-20 ENCOUNTER — Encounter (HOSPITAL_COMMUNITY): Payer: Self-pay

## 2023-10-20 DIAGNOSIS — Z91040 Latex allergy status: Secondary | ICD-10-CM | POA: Insufficient documentation

## 2023-10-20 DIAGNOSIS — O3680X0 Pregnancy with inconclusive fetal viability, not applicable or unspecified: Secondary | ICD-10-CM

## 2023-10-20 DIAGNOSIS — Z3A00 Weeks of gestation of pregnancy not specified: Secondary | ICD-10-CM | POA: Insufficient documentation

## 2023-10-20 DIAGNOSIS — O200 Threatened abortion: Secondary | ICD-10-CM | POA: Diagnosis present

## 2023-10-20 DIAGNOSIS — E876 Hypokalemia: Secondary | ICD-10-CM

## 2023-10-20 LAB — CBC WITH DIFFERENTIAL/PLATELET
Abs Immature Granulocytes: 0.03 10*3/uL (ref 0.00–0.07)
Basophils Absolute: 0.1 10*3/uL (ref 0.0–0.1)
Basophils Relative: 1 %
Eosinophils Absolute: 0 10*3/uL (ref 0.0–0.5)
Eosinophils Relative: 0 %
HCT: 38.2 % (ref 36.0–46.0)
Hemoglobin: 13.4 g/dL (ref 12.0–15.0)
Immature Granulocytes: 0 %
Lymphocytes Relative: 18 %
Lymphs Abs: 1.8 10*3/uL (ref 0.7–4.0)
MCH: 31.8 pg (ref 26.0–34.0)
MCHC: 35.1 g/dL (ref 30.0–36.0)
MCV: 90.5 fL (ref 80.0–100.0)
Monocytes Absolute: 0.6 10*3/uL (ref 0.1–1.0)
Monocytes Relative: 6 %
Neutro Abs: 7.4 10*3/uL (ref 1.7–7.7)
Neutrophils Relative %: 75 %
Platelets: 356 10*3/uL (ref 150–400)
RBC: 4.22 MIL/uL (ref 3.87–5.11)
RDW: 12.5 % (ref 11.5–15.5)
WBC: 9.8 10*3/uL (ref 4.0–10.5)
nRBC: 0 % (ref 0.0–0.2)

## 2023-10-20 LAB — BASIC METABOLIC PANEL
Anion gap: 8 (ref 5–15)
BUN: 6 mg/dL (ref 6–20)
CO2: 27 mmol/L (ref 22–32)
Calcium: 9.2 mg/dL (ref 8.9–10.3)
Chloride: 102 mmol/L (ref 98–111)
Creatinine, Ser: 0.58 mg/dL (ref 0.44–1.00)
GFR, Estimated: >60 mL/min (ref >60)
Glucose, Bld: 102 mg/dL — ABNORMAL HIGH (ref 70–99)
Potassium: 2.6 mmol/L — CL (ref 3.5–5.1)
Sodium: 137 mmol/L (ref 135–145)

## 2023-10-20 LAB — ABO/RH: ABO/RH(D): O POS

## 2023-10-20 LAB — HCG, QUANTITATIVE, PREGNANCY: hCG, Beta Chain, Quant, S: 773 m[IU]/mL — ABNORMAL HIGH (ref <5)

## 2023-10-20 MED ORDER — POTASSIUM CHLORIDE 10 MEQ/100ML IV SOLN
10.0000 meq | Freq: Once | INTRAVENOUS | Status: AC
  Administered 2023-10-20: 10 meq via INTRAVENOUS
  Filled 2023-10-20: qty 100

## 2023-10-20 MED ORDER — POTASSIUM CHLORIDE CRYS ER 20 MEQ PO TBCR
40.0000 meq | EXTENDED_RELEASE_TABLET | Freq: Once | ORAL | Status: AC
  Administered 2023-10-20: 40 meq via ORAL
  Filled 2023-10-20: qty 2

## 2023-10-20 MED ORDER — POTASSIUM CHLORIDE ER 10 MEQ PO TBCR: 20.0000 meq | EXTENDED_RELEASE_TABLET | Freq: Every day | ORAL | 0 refills | Status: DC

## 2023-10-20 NOTE — ED Provider Notes (Signed)
  Physical Exam  BP (!) 145/98   Pulse 74   Temp 99.6 F (37.6 C) (Oral)   Resp 13   Wt 76.2 kg   LMP 09/07/2023   SpO2 100%   BMI 31.74 kg/m   Physical Exam  Procedures  Procedures  ED Course / MDM    Medical Decision Making Amount and/or Complexity of Data Reviewed Labs: ordered. Radiology: ordered.  Risk Prescription drug management.  This patient's care was assumed by me at shift change. The tests pending are ultrasound pelvis. Plan is for discharge after results if normal.   Patient was re-evaluated by me as well. I discussed their result with them and plan is for discharge with ER follow-up with OB for repeat hCG, return precautions given if she develops pain, dizziness heavy bleeding, or any other new or worsening symptoms.  Hypokalemia noted and repleted in the ED will give potassium supplement for the next couple of days and advised on high potassium diet.  Patient declined pelvic exam in the ED, had urinalysis 2 days ago that showed a significant UTI and patient's not having any urinary symptoms.  She is anxious to go home advised on follow-up and strict return precautions   On my repeat evaluation I discussed above with patient and she understands but she was able to show me a picture on her phone of what looks like products of conception that she passed today.  We discussed that while she is likely correct that this is probably a completed miscarriage she still needs to make sure she follows up      Josem Kaufmann 10/20/23 2035    Eber Hong, MD 10/21/23 [phone]

## 2023-10-20 NOTE — ED Provider Triage Note (Signed)
Emergency Medicine Provider Triage Evaluation Note  Graylee Parke , a 27 y.o. female  was evaluated in triage.  Pt complains of vaginal bleeding in pregnancy.  Review of Systems  Positive: Vaginal bleeding Negative: Abdominal pain/cramping, urinary symptoms  Physical Exam  BP (!) 134/98   Pulse 91   Temp 99.6 F (37.6 C) (Oral)   Resp 20   Wt 76.2 kg   LMP 09/07/2023   SpO2 100%   BMI 31.74 kg/m  Gen:   Awake, no distress   Resp:  Normal effort  MSK:   Moves extremities without difficulty  Other:    Medical Decision Making  Medically screening exam initiated at 2:41 PM.  Appropriate orders placed.  Mychele Schoch was informed that the remainder of the evaluation will be completed by another provider, this initial triage assessment does not replace that evaluation, and the importance of remaining in the ED until their evaluation is complete.  Patient G3P2, no history of miscarriage presents with vaginal bleeding that started today. Small volume blood without abdominal cramping. Seen yesterday for dizziness.    Elpidio Anis, PA-C 10/20/23 1442

## 2023-10-20 NOTE — ED Notes (Addendum)
Patient transported to Ultrasound 

## 2023-10-20 NOTE — ED Triage Notes (Signed)
C/o vaginal bleeding that started this am at 11am when taking a bath.  Denies abd pain. Denies urinary s/sy [redacted] weeks pregnant.  G4P3.

## 2023-10-20 NOTE — Discharge Instructions (Addendum)
Seen in the ER today for vaginal bleeding with a positive pregnancy test.  Your blood work did show a very low potassium.  We are giving you potassium here and will give you prescription for potassium to take for the next couple of days.  Make sure you are eating and drinking enough, foods high in potassium include bananas, oranges and orange juice, cantaloupe, and potatoes. Your blood pregnancy test is only 773.  This is typically not high enough to see anything on ultrasound.  We did get an ultrasound today that did not show any definite pregnancy.  We are not able to tell topic pregnancy, a very early normal pregnancy or a failed pregnancy.  You will need to follow-up with the obstetrician's office and have repeat blood work in 48 hours.  Call in the morning to schedule appointment.  If you have pain, dizziness, passing out, heavy bleeding or any other worrisome symptoms you should come back to the ER right away.

## 2023-10-20 NOTE — ED Notes (Signed)
Patient ambulated to the toilet to use the restroom. Called back, Patient crying and clots noted in toilet. PA-C notified.

## 2023-10-20 NOTE — ED Provider Notes (Signed)
[place] EMERGENCY DEPARTMENT AT [facility] Provider Note   CSN: [id number] Arrival date & time: 10/20/23  1353     History  Chief Complaint  Patient presents with   Vaginal Bleeding    Ruth Daniels is a 27 y.o. female.  Patient to ED, known pregnant, G3P2, with vaginal bleeding that started this morning. She describes scant bleeding, no clots. Denies abdominal cramping or pain. Seen in the ED yesterday for dizziness which is better. No fever, vomiting.   The history is provided by the patient. No language interpreter was used.  Vaginal Bleeding      Home Medications Prior to Admission medications   Medication Sig Start Date End Date Taking? Authorizing Provider  acetaminophen (TYLENOL) 500 MG tablet Take 500 mg by mouth every 6 (six) hours as needed for mild pain or moderate pain.    [provider]  albuterol (PROAIR HFA) 108 (90 Base) MCG/ACT inhaler Inhale 1-2 puffs into the lungs every 6 (six) hours as needed for wheezing or shortness of breath.    [provider]  albuterol (PROVENTIL) (5 MG/ML) 0.5% nebulizer solution Take 0.5 mLs (2.5 mg total) by nebulization every 6 (six) hours as needed for wheezing or shortness of breath. 08/08/18   Fawze, Mina A, PA-C  EPINEPHrine 0.3 mg/0.3 mL IJ SOAJ injection Inject 0.3 mg into the muscle as needed for anaphylaxis. 06/03/23   Benjiman Core, MD  ibuprofen (ADVIL) 800 MG tablet Take 1 tablet (800 mg total) by mouth every 8 (eight) hours as needed. 09/19/23   Elson Areas, PA-C  methocarbamol (ROBAXIN) 500 MG tablet Take 1 tablet (500 mg total) by mouth 4 (four) times daily. 09/19/23   Elson Areas, PA-C  naproxen (NAPROSYN) 500 MG tablet Take 1 tablet (500 mg total) by mouth 2 (two) times daily with a meal. 06/27/22   Eber Hong, MD  ondansetron (ZOFRAN-ODT) 4 MG disintegrating tablet Take 1 tablet (4 mg total) by mouth every 8 (eight) hours as needed for nausea. 10/18/23   Eber Hong, MD       Allergies    Bee venom, Penicillins, Latex, and Mushroom extract complex    Review of Systems   Review of Systems  Genitourinary:  Positive for vaginal bleeding.    Physical Exam Updated Vital Signs BP (!) 145/98   Pulse 74   Temp 99.6 F (37.6 C) (Oral)   Resp 13   Wt 76.2 kg   LMP 09/07/2023   SpO2 100%   BMI 31.74 kg/m  Physical Exam Vitals and nursing note reviewed.  Constitutional:      Appearance: Normal appearance.  Eyes:     Conjunctiva/sclera: Conjunctivae normal.  Cardiovascular:     Rate and Rhythm: Normal rate.  Pulmonary:     Effort: Pulmonary effort is normal.  Abdominal:     Palpations: Abdomen is soft.     Tenderness: There is no abdominal tenderness.  Skin:    General: Skin is warm and dry.  Neurological:     Mental Status: She is alert.     ED Results / Procedures / Treatments   Labs (all labs ordered are listed, but only abnormal results are displayed) Labs Reviewed  HCG, QUANTITATIVE, PREGNANCY - Abnormal; Notable for the following components:      Result Value   hCG, Beta Chain, Quant, S 773 (*)    All other components within normal limits  BASIC METABOLIC PANEL - Abnormal; Notable for the following components:  Potassium 2.6 (*)    Glucose, Bld 102 (*)    All other components within normal limits  CBC WITH DIFFERENTIAL/PLATELET  URINALYSIS, ROUTINE W REFLEX MICROSCOPIC  ABO/RH   Results for orders placed or performed during the hospital encounter of 10/20/23  hCG, quantitative, pregnancy  Result Value Ref Range   hCG, Beta Chain, Quant, S 773 (H) <5 mIU/mL  CBC with Differential  Result Value Ref Range   WBC 9.8 4.0 - 10.5 K/uL   RBC 4.22 3.87 - 5.11 MIL/uL   Hemoglobin 13.4 12.0 - 15.0 g/dL   HCT 40.9 81.1 - 91.4 %   MCV 90.5 80.0 - 100.0 fL   MCH 31.8 26.0 - 34.0 pg   MCHC 35.1 30.0 - 36.0 g/dL   RDW 78.2 95.6 - 21.3 %   Platelets 356 150 - 400 K/uL   nRBC 0.0 0.0 - 0.2 %   Neutrophils Relative % 75 %   Neutro  Abs 7.4 1.7 - 7.7 K/uL   Lymphocytes Relative 18 %   Lymphs Abs 1.8 0.7 - 4.0 K/uL   Monocytes Relative 6 %   Monocytes Absolute 0.6 0.1 - 1.0 K/uL   Eosinophils Relative 0 %   Eosinophils Absolute 0.0 0.0 - 0.5 K/uL   Basophils Relative 1 %   Basophils Absolute 0.1 0.0 - 0.1 K/uL   Immature Granulocytes 0 %   Abs Immature Granulocytes 0.03 0.00 - 0.07 K/uL  Basic metabolic panel  Result Value Ref Range   Sodium 137 135 - 145 mmol/L   Potassium 2.6 (LL) 3.5 - 5.1 mmol/L   Chloride 102 98 - 111 mmol/L   CO2 27 22 - 32 mmol/L   Glucose, Bld 102 (H) 70 - 99 mg/dL   BUN 6 6 - 20 mg/dL   Creatinine, Ser 0.86 0.44 - 1.00 mg/dL   Calcium 9.2 8.9 - 57.8 mg/dL   GFR, Estimated >46 >96 mL/min   Anion gap 8 5 - 15  ABO/Rh  Result Value Ref Range   ABO/RH(D)      O POS Performed at [facility], [address], [place], [place] [address]     EKG EKG Interpretation Date/Time:  Sunday October 20 2023 16:00:08 EDT Ventricular Rate:  70 PR Interval:  137 QRS Duration:  90 QT Interval:  389 QTC Calculation: 420 R Axis:   78  Text Interpretation: Sinus rhythm since last tracing no significant change Confirmed by Eber Hong [phone]) on 10/20/2023 4:03:07 PM  Radiology No results found.  Procedures Procedures    Medications Ordered in ED Medications  potassium chloride 10 mEq in 100 mL IVPB (0 mEq Intravenous Stopped 10/20/23 1727)    ED Course/ Medical Decision Making/ A&P                                 Medical Decision Making This patient presents to the ED for concern of vaginal bleeding during pregnancy, this involves an extensive number of treatment options, and is a complaint that carries with it a high risk of complications and morbidity.  The differential diagnosis includes ectopic, threatened miscarriage, normal pregnancy    Additional history obtained:  Additional history obtained from NA External records from outside source obtained and reviewed including  previous ED visit 10/19/23.    Lab Tests:  I Ordered, and personally interpreted labs.  The pertinent results include:  Urine pregnancy yesterday positive. Quant today 773, which is  not consistent with patient's estimate of 6 weeks. Hypokalemia of 2.6. Normal hgb. Blood type O+.     Imaging Studies ordered:  I ordered imaging studies including OB US <14 weeks  I independently visualized and interpreted imaging which showed (Pending at time of sign out to Carleene Overlie, PA-C at end of shift) I agree with the radiologist interpretation    Problem List / ED Course / Critical interventions / Medication management  [place] pending for evaluation of viable pregnancy vs ectopic vs fetal demise/miscarriage     Test / Admission - Considered:  Pending ultrasound to determine diagnosis and disposition.     Amount and/or Complexity of Data Reviewed Labs: ordered. Radiology: ordered.  Risk Prescription drug management.           Final Clinical Impression(s) / ED Diagnoses Final diagnoses:  Threatened miscarriage    Rx / DC Orders ED Discharge Orders     None         Danne Harbor 10/20/23 1854    Eber Hong, MD 10/21/23 1521

## 2023-11-15 ENCOUNTER — Emergency Department (HOSPITAL_COMMUNITY)
Admission: EM | Admit: 2023-11-15 | Discharge: 2023-11-15 | Disposition: A | Discharge status: Home / Self Care | Payer: MEDICAID | Attending: Emergency Medicine | Admitting: Emergency Medicine

## 2023-11-15 ENCOUNTER — Encounter (HOSPITAL_COMMUNITY): Payer: Self-pay

## 2023-11-15 ENCOUNTER — Other Ambulatory Visit: Payer: Self-pay

## 2023-11-15 DIAGNOSIS — Z3201 Encounter for pregnancy test, result positive: Secondary | ICD-10-CM

## 2023-11-15 DIAGNOSIS — Z91040 Latex allergy status: Secondary | ICD-10-CM | POA: Diagnosis not present

## 2023-11-15 DIAGNOSIS — R5383 Other fatigue: Secondary | ICD-10-CM | POA: Diagnosis not present

## 2023-11-15 DIAGNOSIS — O99891 Other specified diseases and conditions complicating pregnancy: Secondary | ICD-10-CM | POA: Diagnosis present

## 2023-11-15 LAB — POC URINE PREG, ED
Preg Test, Ur: NEGATIVE
Preg Test, Ur: NEGATIVE

## 2023-11-15 LAB — HCG, QUANTITATIVE, PREGNANCY: hCG, Beta Chain, Quant, S: 26 m[IU]/mL — ABNORMAL HIGH (ref <5)

## 2023-11-15 NOTE — ED Provider Notes (Signed)
[place] EMERGENCY DEPARTMENT AT [facility] Provider Note   CSN: [id number] Arrival date & time: 11/15/23  1949     History  Chief Complaint  Patient presents with   Possible Pregnancy    Ruth Daniels is a 27 y.o. female.  She presents the ER for evaluation of positive pregnancy test.  Patient had a miscarriage in October, followed up with OB/GYN who trended her hCGs, she also had a negative home pregnancy test after the miscarriage.  She is continue to be sexually active in the day and had 7 positive home pregnancy tests which she has a picture of on her phone.  Denies vaginal bleeding or stomach pain, she does admit to fatigue.  She has not had a period since she had a miscarriage in October.  She presents for evaluation of possible pregnancies.   Possible Pregnancy       Home Medications Prior to Admission medications   Medication Sig Start Date End Date Taking? Authorizing Provider  acetaminophen (TYLENOL) 500 MG tablet Take 500 mg by mouth every 6 (six) hours as needed for mild pain or moderate pain.    [provider]  albuterol (PROAIR HFA) 108 (90 Base) MCG/ACT inhaler Inhale 1-2 puffs into the lungs every 6 (six) hours as needed for wheezing or shortness of breath.    [provider]  albuterol (PROVENTIL) (5 MG/ML) 0.5% nebulizer solution Take 0.5 mLs (2.5 mg total) by nebulization every 6 (six) hours as needed for wheezing or shortness of breath. 08/08/18   Fawze, Mina A, PA-C  EPINEPHrine 0.3 mg/0.3 mL IJ SOAJ injection Inject 0.3 mg into the muscle as needed for anaphylaxis. 06/03/23   Benjiman Core, MD  ibuprofen (ADVIL) 800 MG tablet Take 1 tablet (800 mg total) by mouth every 8 (eight) hours as needed. 09/19/23   Elson Areas, PA-C  methocarbamol (ROBAXIN) 500 MG tablet Take 1 tablet (500 mg total) by mouth 4 (four) times daily. 09/19/23   Elson Areas, PA-C  naproxen (NAPROSYN) 500 MG tablet Take 1 tablet (500 mg total) by  mouth 2 (two) times daily with a meal. 06/27/22   Eber Hong, MD  ondansetron (ZOFRAN-ODT) 4 MG disintegrating tablet Take 1 tablet (4 mg total) by mouth every 8 (eight) hours as needed for nausea. 10/18/23   Eber Hong, MD  potassium chloride (KLOR-CON) 10 MEQ tablet Take 2 tablets (20 mEq total) by mouth daily for 3 days. 10/20/23 10/23/23  Carmel Sacramento A, PA-C      Allergies    Bee venom, Penicillins, Latex, and Mushroom extract complex    Review of Systems   Review of Systems  Physical Exam Updated Vital Signs BP 126/87   Pulse 85   Temp 98.8 F (37.1 C) (Oral)   Resp 14   Ht 5\' 1"  (1.549 m)   Wt 76.2 kg   LMP 09/07/2023   SpO2 99%   Breastfeeding Unknown   BMI 31.74 kg/m  Physical Exam Vitals and nursing note reviewed.  Constitutional:      General: She is not in acute distress.    Appearance: She is well-developed.  HENT:     Head: Normocephalic and atraumatic.  Eyes:     Conjunctiva/sclera: Conjunctivae normal.  Cardiovascular:     Rate and Rhythm: Normal rate and regular rhythm.     Heart sounds: No murmur heard. Pulmonary:     Effort: Pulmonary effort is normal. No respiratory distress.     Breath sounds: Normal  breath sounds.  Abdominal:     Palpations: Abdomen is soft.     Tenderness: There is no abdominal tenderness.  Musculoskeletal:        General: No swelling.     Cervical back: Neck supple.  Skin:    General: Skin is warm and dry.     Capillary Refill: Capillary refill takes less than 2 seconds.  Neurological:     General: No focal deficit present.     Mental Status: She is alert and oriented to person, place, and time.  Psychiatric:        Mood and Affect: Mood normal.     ED Results / Procedures / Treatments   Labs (all labs ordered are listed, but only abnormal results are displayed) Labs Reviewed  HCG, QUANTITATIVE, PREGNANCY - Abnormal; Notable for the following components:      Result Value   hCG, Beta Chain, Quant, S 26 (*)     All other components within normal limits  POC URINE PREG, ED  POC URINE PREG, ED    EKG None  Radiology No results found.  Procedures Procedures    Medications Ordered in ED Medications - No data to display  ED Course/ Medical Decision Making/ A&P                                 Medical Decision Making This patient presents to the ED for concern of positive home pregnancy test after recent miscarriage, this involves an extensive number of treatment options, and is a complaint that carries with it a high risk of complications and morbidity.  The differential diagnosis includes pregnancy, ectopic pregnancy, blighted ovum, threatened miscarriage, other      Problem List / ED Course / Critical interventions / Medication management  Has had 7 positive pregnancy test since yesterday, urine pregnancy here is negative today.  HCG used 26.  Discussed with patient this could be a very early pregnancy, failed pregnancy, ectopic, she is having no pain or bleeding, needs to follow-up outpatient with OB/GYN  I have reviewed the patients home medicines and have made adjustments as needed      Amount and/or Complexity of Data Reviewed Labs: ordered.           Final Clinical Impression(s) / ED Diagnoses Final diagnoses:  Positive pregnancy test    Rx / DC Orders ED Discharge Orders     None         Josem Kaufmann 11/15/23 2144    Eber Hong, MD 11/16/23 1126

## 2023-11-15 NOTE — Discharge Instructions (Signed)
Follow-up with your OB/GYN doctor, your blood pregnancy test was 26.  They would need to follow with you as an outpatient.  Come back to the ER for new or worsening symptoms.  Start taking prenatal vitamins.

## 2023-11-15 NOTE — ED Triage Notes (Signed)
Pt states she had a miscarriage last month, taken multiple pregnancy tests at home since and have been negative. However, states she took another 3 days ago and was positive. Has been sexually active after miscarriage. Here for confirmation.

## 2023-11-18 ENCOUNTER — Other Ambulatory Visit: Payer: Self-pay

## 2023-11-18 ENCOUNTER — Emergency Department (HOSPITAL_COMMUNITY)
Admission: EM | Admit: 2023-11-18 | Discharge: 2023-11-18 | Disposition: A | Discharge status: Home / Self Care | Payer: MEDICAID | Attending: Emergency Medicine | Admitting: Emergency Medicine

## 2023-11-18 ENCOUNTER — Other Ambulatory Visit (HOSPITAL_COMMUNITY)
Admission: RE | Admit: 2023-11-18 | Discharge: 2023-11-18 | Disposition: A | Discharge status: Home / Self Care | Payer: MEDICAID | Source: Ambulatory Visit | Attending: Unknown Physician Specialty | Admitting: Unknown Physician Specialty

## 2023-11-18 DIAGNOSIS — Z5321 Procedure and treatment not carried out due to patient leaving prior to being seen by health care provider: Secondary | ICD-10-CM | POA: Diagnosis not present

## 2023-11-18 DIAGNOSIS — O0281 Inappropriate change in quantitative human chorionic gonadotropin (hCG) in early pregnancy: Secondary | ICD-10-CM | POA: Insufficient documentation

## 2023-11-18 DIAGNOSIS — Z008 Encounter for other general examination: Secondary | ICD-10-CM | POA: Insufficient documentation

## 2023-11-18 DIAGNOSIS — Z8759 Personal history of other complications of pregnancy, childbirth and the puerperium: Secondary | ICD-10-CM | POA: Insufficient documentation

## 2023-11-18 LAB — HCG, QUANTITATIVE, PREGNANCY: hCG, Beta Chain, Quant, S: 80 m[IU]/mL — ABNORMAL HIGH (ref <5)

## 2023-11-18 NOTE — ED Triage Notes (Signed)
Pt reports she was sent by her OBGYN for hcg levels

## 2023-11-22 ENCOUNTER — Encounter (HOSPITAL_COMMUNITY): Payer: Self-pay

## 2023-11-22 ENCOUNTER — Emergency Department (HOSPITAL_COMMUNITY)
Admission: EM | Admit: 2023-11-22 | Discharge: 2023-11-22 | Disposition: A | Discharge status: Home / Self Care | Payer: MEDICAID | Attending: Emergency Medicine | Admitting: Emergency Medicine

## 2023-11-22 ENCOUNTER — Other Ambulatory Visit: Payer: Self-pay

## 2023-11-22 DIAGNOSIS — R519 Headache, unspecified: Secondary | ICD-10-CM | POA: Diagnosis present

## 2023-11-22 DIAGNOSIS — Z3201 Encounter for pregnancy test, result positive: Secondary | ICD-10-CM | POA: Diagnosis not present

## 2023-11-22 DIAGNOSIS — Z91040 Latex allergy status: Secondary | ICD-10-CM | POA: Diagnosis not present

## 2023-11-22 LAB — COMPREHENSIVE METABOLIC PANEL
ALT: 15 U/L (ref 0–44)
AST: 16 U/L (ref 15–41)
Albumin: 4.4 g/dL (ref 3.5–5.0)
Alkaline Phosphatase: 59 U/L (ref 38–126)
Anion gap: 8 (ref 5–15)
BUN: 10 mg/dL (ref 6–20)
CO2: 24 mmol/L (ref 22–32)
Calcium: 8.8 mg/dL — ABNORMAL LOW (ref 8.9–10.3)
Chloride: 104 mmol/L (ref 98–111)
Creatinine, Ser: 0.58 mg/dL (ref 0.44–1.00)
GFR, Estimated: >60 mL/min (ref >60)
Glucose, Bld: 98 mg/dL (ref 70–99)
Potassium: 3.7 mmol/L (ref 3.5–5.1)
Sodium: 136 mmol/L (ref 135–145)
Total Bilirubin: 0.7 mg/dL (ref <1.2)
Total Protein: 7.9 g/dL (ref 6.5–8.1)

## 2023-11-22 LAB — URINALYSIS, ROUTINE W REFLEX MICROSCOPIC
Bacteria, UA: NONE SEEN
Bilirubin Urine: NEGATIVE
Bilirubin Urine: NEGATIVE
Glucose, UA: NEGATIVE mg/dL
Glucose, UA: NEGATIVE mg/dL
Hgb urine dipstick: NEGATIVE
Hgb urine dipstick: NEGATIVE
Ketones, ur: 5 mg/dL — AB
Ketones, ur: 20 mg/dL — AB
Leukocytes,Ua: NEGATIVE
Leukocytes,Ua: NEGATIVE
Nitrite: NEGATIVE
Nitrite: NEGATIVE
Protein, ur: 100 mg/dL — AB
Protein, ur: 30 mg/dL — AB
Specific Gravity, Urine: 1.033 — ABNORMAL HIGH (ref 1.005–1.030)
Specific Gravity, Urine: 1.032 — ABNORMAL HIGH (ref 1.005–1.030)
WBC, UA: >50 WBC/hpf (ref 0–5)
pH: 5 (ref 5.0–8.0)
pH: 5 (ref 5.0–8.0)

## 2023-11-22 LAB — CBC
HCT: 37.7 % (ref 36.0–46.0)
Hemoglobin: 12.9 g/dL (ref 12.0–15.0)
MCH: 30.9 pg (ref 26.0–34.0)
MCHC: 34.2 g/dL (ref 30.0–36.0)
MCV: 90.4 fL (ref 80.0–100.0)
Platelets: 291 10*3/uL (ref 150–400)
RBC: 4.17 MIL/uL (ref 3.87–5.11)
RDW: 12.6 % (ref 11.5–15.5)
WBC: 6.5 10*3/uL (ref 4.0–10.5)
nRBC: 0 % (ref 0.0–0.2)

## 2023-11-22 LAB — HCG, QUANTITATIVE, PREGNANCY: hCG, Beta Chain, Quant, S: 689 m[IU]/mL — ABNORMAL HIGH (ref <5)

## 2023-11-22 LAB — LIPASE, BLOOD: Lipase: 27 U/L (ref 11–51)

## 2023-11-22 NOTE — ED Provider Triage Note (Signed)
Emergency Medicine Provider Triage Evaluation Note  Ruth Daniels , a 27 y.o. female  was evaluated in triage.  Pt complains of fatigue, N/V for the past week. She reports that she has not has her menstrual cycle since miscarriage 10/20/23 so is concerned she may be pregnant. She has no urinary symptoms, vaginal discharge, vaginal bleeding but is concerned for STD exposure.   Review of Systems  Positive: fatigue, N/V Negative: no urinary symptoms, vaginal discharge, vaginal bleeding  Physical Exam  BP 132/84 (BP Location: Right Arm)   Pulse 81   Temp 99.1 F (37.3 C) (Oral)   Resp 16   Ht 5\' 1"  (1.549 m)   Wt 76.2 kg   LMP 09/21/2023 (Approximate)   SpO2 100%   BMI 31.74 kg/m  Gen:   Awake, no distress   Resp:  Normal effort  MSK:   Moves extremities without difficulty  Other:  Well appearing  Medical Decision Making  Medically screening exam initiated at 10:05 AM.  Appropriate orders placed.  Ruth Daniels was informed that the remainder of the evaluation will be completed by another provider, this initial triage assessment does not replace that evaluation, and the importance of remaining in the ED until their evaluation is complete.  Labs ordered   Ruth Daniels, [place] 11/22/23 1007

## 2023-11-22 NOTE — Discharge Instructions (Signed)
You for letting us evaluate you today.  Your urine was not significant for infection or large red blood cells.  You are pregnant and have provided you with this paperwork.  Please make sure to stay hydrated.  Please follow-up with your OB/GYN appointment and you may discuss further concerns there.  Your results should result in 1 to 2 days and you can check this on your MyChart or asked your GYN provider to check for you.  Please return to emergency department if you experience vaginal bleeding more than just spotting, uncontrollable vomiting, loss of consciousness

## 2023-11-22 NOTE — ED Provider Notes (Signed)
[place] EMERGENCY DEPARTMENT AT [facility] Provider Note   CSN: [id number] Arrival date & time: 11/22/23  4132     History {Add pertinent medical, surgical, social history, OB history to HPI:1} Chief Complaint  Patient presents with   Headache    Ruth Daniels is a 27 y.o. female with past medical history of asthma, endometriosis, eczema presents for evaluation of fatigue, occasional vomiting for the past week.  She reports she has not had a menstrual cycle since her miscarriage on 10/20/2023/concerned that she may be pregnant.  She has no urinary symptoms, vaginal discharge, vaginal bleeding, fever, flank pain, but is concerned that she might have an STD exposure as her partner is nonmonogamous.    Headache Associated symptoms: fatigue, nausea and vomiting   Associated symptoms: no abdominal pain, no cough, no diarrhea, no dizziness, no fever, no numbness, no seizures and no weakness        Home Medications Prior to Admission medications   Medication Sig Start Date End Date Taking? Authorizing Provider  acetaminophen (TYLENOL) 500 MG tablet Take 500 mg by mouth every 6 (six) hours as needed for mild pain or moderate pain.    [provider]  albuterol (PROAIR HFA) 108 (90 Base) MCG/ACT inhaler Inhale 1-2 puffs into the lungs every 6 (six) hours as needed for wheezing or shortness of breath.    [provider]  albuterol (PROVENTIL) (5 MG/ML) 0.5% nebulizer solution Take 0.5 mLs (2.5 mg total) by nebulization every 6 (six) hours as needed for wheezing or shortness of breath. 08/08/18   Fawze, Mina A, PA-C  EPINEPHrine 0.3 mg/0.3 mL IJ SOAJ injection Inject 0.3 mg into the muscle as needed for anaphylaxis. 06/03/23   Benjiman Core, MD  ibuprofen (ADVIL) 800 MG tablet Take 1 tablet (800 mg total) by mouth every 8 (eight) hours as needed. 09/19/23   Elson Areas, PA-C  methocarbamol (ROBAXIN) 500 MG tablet Take 1 tablet (500 mg total) by mouth 4  (four) times daily. 09/19/23   Elson Areas, PA-C  naproxen (NAPROSYN) 500 MG tablet Take 1 tablet (500 mg total) by mouth 2 (two) times daily with a meal. 06/27/22   Eber Hong, MD  ondansetron (ZOFRAN-ODT) 4 MG disintegrating tablet Take 1 tablet (4 mg total) by mouth every 8 (eight) hours as needed for nausea. 10/18/23   Eber Hong, MD  potassium chloride (KLOR-CON) 10 MEQ tablet Take 2 tablets (20 mEq total) by mouth daily for 3 days. 10/20/23 10/23/23  Carmel Sacramento A, PA-C      Allergies    Bee venom, Penicillins, Latex, and Mushroom extract complex    Review of Systems   Review of Systems  Constitutional:  Positive for fatigue. Negative for chills and fever.  Respiratory:  Negative for cough, chest tightness, shortness of breath and wheezing.   Cardiovascular:  Negative for chest pain and palpitations.  Gastrointestinal:  Positive for nausea and vomiting. Negative for abdominal pain, constipation and diarrhea.  Neurological:  Positive for headaches. Negative for dizziness, seizures, weakness, light-headedness and numbness.    Physical Exam Updated Vital Signs BP 132/84 (BP Location: Right Arm)   Pulse 81   Temp 99.1 F (37.3 C) (Oral)   Resp 16   Ht 5\' 1"  (1.549 m)   Wt 76.2 kg   LMP 09/21/2023 (Approximate)   SpO2 100%   BMI 31.74 kg/m  Physical Exam Vitals and nursing note reviewed.  Constitutional:      General: She is not  in acute distress.    Appearance: Normal appearance. She is not ill-appearing.  HENT:     Head: Normocephalic and atraumatic.  Eyes:     Conjunctiva/sclera: Conjunctivae normal.  Cardiovascular:     Rate and Rhythm: Normal rate.  Pulmonary:     Effort: Pulmonary effort is normal. No respiratory distress.     Breath sounds: Normal breath sounds.  Abdominal:     General: Bowel sounds are normal. There is no distension.     Palpations: Abdomen is soft.     Tenderness: There is no abdominal tenderness. There is no right CVA tenderness,  left CVA tenderness, guarding or rebound.     Comments: No peritoneal signs  Skin:    General: Skin is warm.     Capillary Refill: Capillary refill takes less than 2 seconds.     Coloration: Skin is not jaundiced or pale.  Neurological:     Mental Status: She is alert and oriented to person, place, and time. Mental status is at baseline.     ED Results / Procedures / Treatments   Labs (all labs ordered are listed, but only abnormal results are displayed) Labs Reviewed  COMPREHENSIVE METABOLIC PANEL - Abnormal; Notable for the following components:      Result Value   Calcium 8.8 (*)    All other components within normal limits  HCG, QUANTITATIVE, PREGNANCY - Abnormal; Notable for the following components:   hCG, Beta Chain, Quant, S 689 (*)    All other components within normal limits  URINALYSIS, ROUTINE W REFLEX MICROSCOPIC - Abnormal; Notable for the following components:   APPearance TURBID (*)    Specific Gravity, Urine 1.033 (*)    Ketones, ur 5 (*)    Protein, ur 100 (*)    Bacteria, UA MANY (*)    All other components within normal limits  URINALYSIS, ROUTINE W REFLEX MICROSCOPIC - Abnormal; Notable for the following components:   APPearance HAZY (*)    Specific Gravity, Urine 1.032 (*)    Ketones, ur 20 (*)    Protein, ur 30 (*)    All other components within normal limits  LIPASE, BLOOD  CBC  GC/CHLAMYDIA PROBE AMP ([place]) NOT AT [facility]    EKG None  Radiology No results found.  Procedures Procedures  {Document cardiac monitor, telemetry assessment procedure when appropriate:1}  Medications Ordered in ED Medications - No data to display  ED Course/ Medical Decision Making/ A&P   {   Click here for ABCD2, HEART and other calculatorsREFRESH Note before signing :1}                              Medical Decision Making Amount and/or Complexity of Data Reviewed Labs: ordered.     Patient presents to the ED for concern of ***, this involves an  extensive number of treatment options, and is a complaint that carries with it a high risk of complications and morbidity.  The differential diagnosis includes ***   Co morbidities that complicate the patient evaluation  ***   Additional history obtained:  Additional history obtained from *** {Blank multiple:19196::"EMS","Family","Nursing","Outside Medical Records","Past Admission"}   External records from outside source obtained and reviewed including ***   Lab Tests:  I Ordered, and personally interpreted labs.  The pertinent results include:  hCG 689   Problem List / ED Course:  Pregnancy test positive   Reevaluation:  After the interventions noted above,  I reevaluated the patient and found that they have :stayed the same   Social Determinants of Health:  Has OBGYN appointment December 5th   Dispostion:  Patient is well-appearing.  Hemodynamic stable without fever.  See HPI.  Physical exam is reassuring.  Lab work is significant for hCG of 689.  GC chlamydia pending.  As patient has no symptoms at this time with recent pregnancy test positive, do not feel that treating prophylactically is required.  She denies vaginal discharge, vaginal bleeding, vaginal odor without abdominal tenderness.  I discussed following up with her GYN regarding results and/or if symptoms present for further management.  After consideration of the diagnostic results and the patients response to treatment, I feel that the patent would benefit from outpatient management with OBGYN prenatal care.  I discussed findings, disposition, return to emerged part precaution to patient expressed understanding agrees with plan.   {Document critical care time when appropriate:1} {Document review of labs and clinical decision tools ie heart score, Chads2Vasc2 etc:1}  {Document your independent review of radiology images, and any outside records:1} {Document your discussion with family members, caretakers,  and with consultants:1} {Document social determinants of health affecting pt's care:1} {Document your decision making why or why not admission, treatments were needed:1} Final Clinical Impression(s) / ED Diagnoses Final diagnoses:  Pregnancy test positive    Rx / DC Orders ED Discharge Orders     None

## 2023-11-22 NOTE — ED Triage Notes (Signed)
Pt c/o headache, nausea, and vomiting since 11/15/2023. Pt states she has been seen here since but is not having any relief from treatment.

## 2023-11-26 LAB — GC/CHLAMYDIA PROBE AMP (~~LOC~~) NOT AT ARMC
Comment: NEGATIVE
Comment: NORMAL

## 2023-12-04 ENCOUNTER — Ambulatory Visit (INDEPENDENT_AMBULATORY_CARE_PROVIDER_SITE_OTHER): Payer: MEDICAID | Admitting: Women's Health

## 2023-12-04 ENCOUNTER — Encounter: Payer: Self-pay | Admitting: Women's Health

## 2023-12-04 VITALS — BP 153/87 | HR 100 | Ht 61.0 in | Wt 177.0 lb

## 2023-12-04 DIAGNOSIS — Z3201 Encounter for pregnancy test, result positive: Secondary | ICD-10-CM

## 2023-12-04 DIAGNOSIS — O219 Vomiting of pregnancy, unspecified: Secondary | ICD-10-CM

## 2023-12-04 DIAGNOSIS — Z3490 Encounter for supervision of normal pregnancy, unspecified, unspecified trimester: Secondary | ICD-10-CM

## 2023-12-04 DIAGNOSIS — Z98891 History of uterine scar from previous surgery: Secondary | ICD-10-CM | POA: Insufficient documentation

## 2023-12-04 DIAGNOSIS — I10 Essential (primary) hypertension: Secondary | ICD-10-CM | POA: Insufficient documentation

## 2023-12-04 DIAGNOSIS — O34219 Maternal care for unspecified type scar from previous cesarean delivery: Secondary | ICD-10-CM | POA: Diagnosis not present

## 2023-12-04 DIAGNOSIS — O10011 Pre-existing essential hypertension complicating pregnancy, first trimester: Secondary | ICD-10-CM

## 2023-12-04 DIAGNOSIS — Z3A01 Less than 8 weeks gestation of pregnancy: Secondary | ICD-10-CM

## 2023-12-04 DIAGNOSIS — B009 Herpesviral infection, unspecified: Secondary | ICD-10-CM | POA: Insufficient documentation

## 2023-12-04 DIAGNOSIS — Z3491 Encounter for supervision of normal pregnancy, unspecified, first trimester: Secondary | ICD-10-CM

## 2023-12-04 LAB — POCT URINE PREGNANCY: Preg Test, Ur: POSITIVE — AB

## 2023-12-04 MED ORDER — DOXYLAMINE-PYRIDOXINE 10-10 MG PO TBEC: DELAYED_RELEASE_TABLET | ORAL | 6 refills | Status: DC

## 2023-12-04 MED ORDER — NIFEDIPINE ER OSMOTIC RELEASE 30 MG PO TB24: 30.0000 mg | ORAL_TABLET | Freq: Every day | ORAL | 2 refills | Status: AC

## 2023-12-04 NOTE — Progress Notes (Signed)
GYN VISIT Patient name: Ruth Daniels MRN [id number]  Date of birth: 1996/02/22 Chief Complaint:   No chief complaint on file.  History of Present Illness:   Ruth Daniels is a 27 y.o. [phone] African-American female ~4 1/[redacted]wks pregnant based on recent HCG, being seen today for +PT. Had miscarriage in Oct, never had a period, HCG 689 (11/22), +n/v- requests rx. Review of chart reveals several elevated bp's, reports she had pre-e/bp issues w/ pregnancy in past, has never been dx w/ CHTN, not on any meds.  No LMP recorded (lmp unknown). Patient is pregnant. Last pap: 04/17/22     12/04/2023    2:01 PM 07/26/2020    1:37 PM 07/19/2020    4:56 PM  Depression screen PHQ 2/9  Decreased Interest 0 1 0  Down, Depressed, Hopeless 0 1 0  PHQ - 2 Score 0 2 0  Altered sleeping 0 1   Tired, decreased energy 2 1   Change in appetite 3 0   Feeling bad or failure about yourself  0 0   Trouble concentrating 0 1   Moving slowly or fidgety/restless 0 1   Suicidal thoughts 0 0   PHQ-9 Score 5 6         12/04/2023    2:01 PM  GAD 7 : Generalized Anxiety Score  Nervous, Anxious, on Edge 0  Control/stop worrying 0  Worry too much - different things 1  Trouble relaxing 0  Restless 0  Easily annoyed or irritable 0  Afraid - awful might happen 1  Total GAD 7 Score 2     Review of Systems:   Pertinent items are noted in HPI Denies fever/chills, dizziness, headaches, visual disturbances, fatigue, shortness of breath, chest pain, abdominal pain, vomiting, abnormal vaginal discharge/itching/odor/irritation, problems with periods, bowel movements, urination, or intercourse unless otherwise stated above.  Pertinent History Reviewed:  Reviewed past medical,surgical, social, obstetrical and family history.  Reviewed problem list, medications and allergies. Physical Assessment:   Vitals:   12/04/23 1336 12/04/23 1351  BP: (!) 147/81 (!) 153/87  Pulse: 98 100  Weight: 177 lb (80.3 kg)    Height: 5\' 1"  (1.549 m)   Body mass index is 33.44 kg/m.       Physical Examination:   General appearance: alert, well appearing, and in no distress  Mental status: alert, oriented to person, place, and time  Skin: warm & dry   Cardiovascular: normal heart rate noted  Respiratory: normal respiratory effort, no distress  Abdomen: soft, non-tender   Pelvic: examination not indicated  Extremities: no edema   Chaperone: N/A    Results for orders placed or performed in visit on 12/04/23 (from the past 24 hour(s))  POCT urine pregnancy   Collection Time: 12/04/23  1:46 PM  Result Value Ref Range   Preg Test, Ur Positive (A) Negative    Assessment & Plan:  1) ~4 1/[redacted]wks pregnant> by recent HCG (689 on 11/22), will get dating u/s in 3wks. Continue pnv  2) N/V> rx diclegis  3) CHTN> dx today based on today's bp and review of chart (multiple elevated bp's), rx nifedipine 30mg  daily, f/u 1wk for bp check w/ nurse, take nifedipine 1-2hr before appt. Will start ASA @ 12wks  Meds:  Meds ordered this encounter  Medications   NIFEdipine (PROCARDIA-XL/NIFEDICAL-XL) 30 MG 24 hr tablet    Sig: Take 1 tablet (30 mg total) by mouth daily.    Dispense:  90 tablet    Refill:  2  Doxylamine-Pyridoxine (DICLEGIS) 10-10 MG TBEC    Sig: 2 tabs q hs, if sx persist add 1 tab q am on day 3, if sx persist add 1 tab q afternoon on day 4    Dispense:  100 tablet    Refill:  6    Orders Placed This Encounter  Procedures   US OB Comp Less 14 Wks   POCT urine pregnancy    Return for 1wk bp check w/ nurse: then 3wks from now dating u/s.  Cheral Marker CNM, [facility] 12/04/2023 3:22 PM

## 2023-12-04 NOTE — Patient Instructions (Signed)
Kyelle, thank you for choosing our office today! We appreciate the opportunity to meet your healthcare needs. You may receive a short survey by mail, e-mail, or through [employer]. If you are happy with your care we would appreciate if you could take just a few minutes to complete the survey questions. We read all of your comments and take your feedback very seriously. Thank you again for choosing our office.  Center for [employer] [facility] at [facility]  [facility] at [facility] ([address] [place], [place] [address]) Entrance C, located off of [address] Free 24/7 valet parking   Nausea & Vomiting Have saltine crackers or pretzels by your bed and eat a few bites before you raise your head out of bed in the morning Eat small frequent meals throughout the day instead of large meals Drink plenty of fluids throughout the day to stay hydrated, just don't drink a lot of fluids with your meals.  This can make your stomach fill up faster making you feel sick Do not brush your teeth right after you eat Products with real ginger are good for nausea, like ginger ale and ginger hard candy Make sure it says made with real ginger! Sucking on sour candy like lemon heads is also good for nausea If your prenatal vitamins make you nauseated, take them at night so you will sleep through the nausea Sea Bands If you feel like you need medicine for the nausea & vomiting please let us know If you are unable to keep any fluids or food down please let us know   Constipation Drink plenty of fluid, preferably water, throughout the day Eat foods high in fiber such as fruits, vegetables, and grains Exercise, such as walking, is a good way to keep your bowels regular Drink warm fluids, especially warm prune juice, or decaf coffee Eat a 1/2 cup of real oatmeal (not instant), 1/2 cup applesauce, and 1/2-1 cup warm prune juice every day If needed, you may take Colace (docusate sodium) stool  softener once or twice a day to help keep the stool soft.  If you still are having problems with constipation, you may take Miralax once daily as needed to help keep your bowels regular.   Home Blood Pressure Monitoring for Patients   Your provider has recommended that you check your blood pressure (BP) at least once a week at home. If you do not have a blood pressure cuff at home, one will be provided for you. Contact your provider if you have not received your monitor within 1 week.   Helpful Tips for Accurate Home Blood Pressure Checks  Don't smoke, exercise, or drink caffeine 30 minutes before checking your BP Use the restroom before checking your BP (a full bladder can raise your pressure) Relax in a comfortable upright chair Feet on the ground Left arm resting comfortably on a flat surface at the level of your heart Legs uncrossed Back supported Sit quietly and don't talk Place the cuff on your bare arm Adjust snuggly, so that only two fingertips can fit between your skin and the top of the cuff Check 2 readings separated by at least one minute Keep a log of your BP readings For a visual, please reference this diagram: http://ccnc.care/bpdiagram  Provider Name: [facility]     Phone: [phone]  Zone 1: ALL CLEAR  Continue to monitor your symptoms:  BP reading is less than 140 (top number) or less than 90 (bottom  number)  No right upper stomach pain No headaches or seeing spots No feeling nauseated or throwing up No swelling in face and hands  Zone 2: CAUTION Call your doctor's office for any of the following:  BP reading is greater than 140 (top number) or greater than 90 (bottom number)  Stomach pain under your ribs in the middle or right side Headaches or seeing spots Feeling nauseated or throwing up Swelling in face and hands  Zone 3: EMERGENCY  Seek immediate medical care if you have any of the following:  BP reading is greater than160 (top number) or  greater than 110 (bottom number) Severe headaches not improving with Tylenol Serious difficulty catching your breath Any worsening symptoms from Zone 2    First Trimester of Pregnancy The first trimester of pregnancy is from week 1 until the end of week 12 (months 1 through 3). A week after a sperm fertilizes an egg, the egg will implant on the wall of the uterus. This embryo will begin to develop into a baby. Genes from you and your partner are forming the baby. The female genes determine whether the baby is a boy or a girl. At 6-8 weeks, the eyes and face are formed, and the heartbeat can be seen on ultrasound. At the end of 12 weeks, all the baby's organs are formed.  Now that you are pregnant, you will want to do everything you can to have a healthy baby. Two of the most important things are to get good prenatal care and to follow your health care provider's instructions. Prenatal care is all the medical care you receive before the baby's birth. This care will help prevent, find, and treat any problems during the pregnancy and childbirth. BODY CHANGES Your body goes through many changes during pregnancy. The changes vary from woman to woman.  You may gain or lose a couple of pounds at first. You may feel sick to your stomach (nauseous) and throw up (vomit). If the vomiting is uncontrollable, call your health care provider. You may tire easily. You may develop headaches that can be relieved by medicines approved by your health care provider. You may urinate more often. Painful urination may mean you have a bladder infection. You may develop heartburn as a result of your pregnancy. You may develop constipation because certain hormones are causing the muscles that push waste through your intestines to slow down. You may develop hemorrhoids or swollen, bulging veins (varicose veins). Your breasts may begin to grow larger and become tender. Your nipples may stick out more, and the tissue that  surrounds them (areola) may become darker. Your gums may bleed and may be sensitive to brushing and flossing. Dark spots or blotches (chloasma, mask of pregnancy) may develop on your face. This will likely fade after the baby is born. Your menstrual periods will stop. You may have a loss of appetite. You may develop cravings for certain kinds of food. You may have changes in your emotions from day to day, such as being excited to be pregnant or being concerned that something may go wrong with the pregnancy and baby. You may have more vivid and strange dreams. You may have changes in your hair. These can include thickening of your hair, rapid growth, and changes in texture. Some women also have hair loss during or after pregnancy, or hair that feels dry or thin. Your hair will most likely return to normal after your baby is born. WHAT TO EXPECT AT YOUR PRENATAL  VISITS During a routine prenatal visit: You will be weighed to make sure you and the baby are growing normally. Your blood pressure will be taken. Your abdomen will be measured to track your baby's growth. The fetal heartbeat will be listened to starting around week 10 or 12 of your pregnancy. Test results from any previous visits will be discussed. Your health care provider may ask you: How you are feeling. If you are feeling the baby move. If you have had any abnormal symptoms, such as leaking fluid, bleeding, severe headaches, or abdominal cramping. If you have any questions. Other tests that may be performed during your first trimester include: Blood tests to find your blood type and to check for the presence of any previous infections. They will also be used to check for low iron levels (anemia) and Rh antibodies. Later in the pregnancy, blood tests for diabetes will be done along with other tests if problems develop. Urine tests to check for infections, diabetes, or protein in the urine. An ultrasound to confirm the proper growth  and development of the baby. An amniocentesis to check for possible genetic problems. Fetal screens for spina bifida and Down syndrome. You may need other tests to make sure you and the baby are doing well. HOME CARE INSTRUCTIONS  Medicines Follow your health care provider's instructions regarding medicine use. Specific medicines may be either safe or unsafe to take during pregnancy. Take your prenatal vitamins as directed. If you develop constipation, try taking a stool softener if your health care provider approves. Diet Eat regular, well-balanced meals. Choose a variety of foods, such as meat or vegetable-based protein, fish, milk and low-fat dairy products, vegetables, fruits, and whole grain breads and cereals. Your health care provider will help you determine the amount of weight gain that is right for you. Avoid raw meat and uncooked cheese. These carry germs that can cause birth defects in the baby. Eating four or five small meals rather than three large meals a day may help relieve nausea and vomiting. If you start to feel nauseous, eating a few soda crackers can be helpful. Drinking liquids between meals instead of during meals also seems to help nausea and vomiting. If you develop constipation, eat more high-fiber foods, such as fresh vegetables or fruit and whole grains. Drink enough fluids to keep your urine clear or pale yellow. Activity and Exercise Exercise only as directed by your health care provider. Exercising will help you: Control your weight. Stay in shape. Be prepared for labor and delivery. Experiencing pain or cramping in the lower abdomen or low back is a good sign that you should stop exercising. Check with your health care provider before continuing normal exercises. Try to avoid standing for long periods of time. Move your legs often if you must stand in one place for a long time. Avoid heavy lifting. Wear low-heeled shoes, and practice good posture. You may  continue to have sex unless your health care provider directs you otherwise. Relief of Pain or Discomfort Wear a good support bra for breast tenderness.   Take warm sitz baths to soothe any pain or discomfort caused by hemorrhoids. Use hemorrhoid cream if your health care provider approves.   Rest with your legs elevated if you have leg cramps or low back pain. If you develop varicose veins in your legs, wear support hose. Elevate your feet for 15 minutes, 3-4 times a day. Limit salt in your diet. Prenatal Care Schedule your prenatal visits by the  twelfth week of pregnancy. They are usually scheduled monthly at first, then more often in the last 2 months before delivery. Write down your questions. Take them to your prenatal visits. Keep all your prenatal visits as directed by your health care provider. Safety Wear your seat belt at all times when driving. Make a list of emergency phone numbers, including numbers for family, friends, the hospital, and police and fire departments. General Tips Ask your health care provider for a referral to a local prenatal education class. Begin classes no later than at the beginning of month 6 of your pregnancy. Ask for help if you have counseling or nutritional needs during pregnancy. Your health care provider can offer advice or refer you to specialists for help with various needs. Do not use hot tubs, steam rooms, or saunas. Do not douche or use tampons or scented sanitary pads. Do not cross your legs for long periods of time. Avoid cat litter boxes and soil used by cats. These carry germs that can cause birth defects in the baby and possibly loss of the fetus by miscarriage or stillbirth. Avoid all smoking, herbs, alcohol, and medicines not prescribed by your health care provider. Chemicals in these affect the formation and growth of the baby. Schedule a dentist appointment. At home, brush your teeth with a soft toothbrush and be gentle when you floss. SEEK  MEDICAL CARE IF:  You have dizziness. You have mild pelvic cramps, pelvic pressure, or nagging pain in the abdominal area. You have persistent nausea, vomiting, or diarrhea. You have a bad smelling vaginal discharge. You have pain with urination. You notice increased swelling in your face, hands, legs, or ankles. SEEK IMMEDIATE MEDICAL CARE IF:  You have a fever. You are leaking fluid from your vagina. You have spotting or bleeding from your vagina. You have severe abdominal cramping or pain. You have rapid weight gain or loss. You vomit blood or material that looks like coffee grounds. You are exposed to [place] measles and have never had them. You are exposed to fifth disease or chickenpox. You develop a severe headache. You have shortness of breath. You have any kind of trauma, such as from a fall or a car accident. Document Released: 12/11/2001 Document Revised: 05/03/2014 Document Reviewed: 10/27/2013 [facility] Patient Information 2015 [place], [place]. This information is not intended to replace advice given to you by your health care provider. Make sure you discuss any questions you have with your health care provider.

## 2023-12-16 ENCOUNTER — Emergency Department (HOSPITAL_COMMUNITY): Payer: MEDICAID

## 2023-12-16 ENCOUNTER — Other Ambulatory Visit: Payer: Self-pay

## 2023-12-16 ENCOUNTER — Encounter (HOSPITAL_COMMUNITY): Payer: Self-pay

## 2023-12-16 ENCOUNTER — Emergency Department (HOSPITAL_COMMUNITY)
Admission: EM | Admit: 2023-12-16 | Discharge: 2023-12-16 | Disposition: A | Discharge status: Home / Self Care | Payer: MEDICAID | Attending: Emergency Medicine | Admitting: Emergency Medicine

## 2023-12-16 DIAGNOSIS — R1032 Left lower quadrant pain: Secondary | ICD-10-CM | POA: Diagnosis not present

## 2023-12-16 DIAGNOSIS — O26891 Other specified pregnancy related conditions, first trimester: Secondary | ICD-10-CM | POA: Diagnosis not present

## 2023-12-16 DIAGNOSIS — Z91040 Latex allergy status: Secondary | ICD-10-CM | POA: Diagnosis not present

## 2023-12-16 DIAGNOSIS — O99511 Diseases of the respiratory system complicating pregnancy, first trimester: Secondary | ICD-10-CM | POA: Insufficient documentation

## 2023-12-16 DIAGNOSIS — O219 Vomiting of pregnancy, unspecified: Secondary | ICD-10-CM | POA: Diagnosis present

## 2023-12-16 DIAGNOSIS — Z3A01 Less than 8 weeks gestation of pregnancy: Secondary | ICD-10-CM | POA: Insufficient documentation

## 2023-12-16 DIAGNOSIS — J45909 Unspecified asthma, uncomplicated: Secondary | ICD-10-CM | POA: Insufficient documentation

## 2023-12-16 LAB — URINALYSIS, ROUTINE W REFLEX MICROSCOPIC
Bilirubin Urine: NEGATIVE
Glucose, UA: NEGATIVE mg/dL
Hgb urine dipstick: NEGATIVE
Ketones, ur: NEGATIVE mg/dL
Leukocytes,Ua: NEGATIVE
Nitrite: NEGATIVE
Protein, ur: 30 mg/dL — AB
Specific Gravity, Urine: 1.03 (ref 1.005–1.030)
pH: 6 (ref 5.0–8.0)

## 2023-12-16 LAB — CBC WITH DIFFERENTIAL/PLATELET
Abs Immature Granulocytes: 0.02 10*3/uL (ref 0.00–0.07)
Basophils Absolute: 0 10*3/uL (ref 0.0–0.1)
Basophils Relative: 0 %
Eosinophils Absolute: 0 10*3/uL (ref 0.0–0.5)
Eosinophils Relative: 1 %
HCT: 35 % — ABNORMAL LOW (ref 36.0–46.0)
Hemoglobin: 12.1 g/dL (ref 12.0–15.0)
Immature Granulocytes: 0 %
Lymphocytes Relative: 28 %
Lymphs Abs: 2 10*3/uL (ref 0.7–4.0)
MCH: 31.4 pg (ref 26.0–34.0)
MCHC: 34.6 g/dL (ref 30.0–36.0)
MCV: 90.9 fL (ref 80.0–100.0)
Monocytes Absolute: 0.6 10*3/uL (ref 0.1–1.0)
Monocytes Relative: 8 %
Neutro Abs: 4.5 10*3/uL (ref 1.7–7.7)
Neutrophils Relative %: 63 %
Platelets: 296 10*3/uL (ref 150–400)
RBC: 3.85 MIL/uL — ABNORMAL LOW (ref 3.87–5.11)
RDW: 13 % (ref 11.5–15.5)
WBC: 7.1 10*3/uL (ref 4.0–10.5)
nRBC: 0 % (ref 0.0–0.2)

## 2023-12-16 LAB — COMPREHENSIVE METABOLIC PANEL
ALT: 12 U/L (ref 0–44)
AST: 14 U/L — ABNORMAL LOW (ref 15–41)
Albumin: 4 g/dL (ref 3.5–5.0)
Alkaline Phosphatase: 46 U/L (ref 38–126)
Anion gap: 9 (ref 5–15)
BUN: 10 mg/dL (ref 6–20)
CO2: 24 mmol/L (ref 22–32)
Calcium: 9.2 mg/dL (ref 8.9–10.3)
Chloride: 103 mmol/L (ref 98–111)
Creatinine, Ser: 0.52 mg/dL (ref 0.44–1.00)
GFR, Estimated: >60 mL/min (ref >60)
Glucose, Bld: 83 mg/dL (ref 70–99)
Potassium: 3.3 mmol/L — ABNORMAL LOW (ref 3.5–5.1)
Sodium: 136 mmol/L (ref 135–145)
Total Bilirubin: 0.4 mg/dL (ref <1.2)
Total Protein: 7.4 g/dL (ref 6.5–8.1)

## 2023-12-16 LAB — HCG, QUANTITATIVE, PREGNANCY: hCG, Beta Chain, Quant, S: 95884 m[IU]/mL — ABNORMAL HIGH (ref <5)

## 2023-12-16 LAB — LIPASE, BLOOD: Lipase: 31 U/L (ref 11–51)

## 2023-12-16 MED ORDER — DOXYLAMINE-PYRIDOXINE 10-10 MG PO TBEC: DELAYED_RELEASE_TABLET | ORAL | 6 refills | Status: AC

## 2023-12-16 NOTE — ED Triage Notes (Signed)
Patient come in due to having headaches, dizziness, back pain, abdominal pains nausea and vomiting. Believes she could be pregnant. Stated "this has been going on for a month."  Pain 7/10.

## 2023-12-16 NOTE — Discharge Instructions (Addendum)
You are seen in the emergency room today.  Your ultrasound shows intrauterine pregnancy.  Please call your women's health provider and schedule follow-up.  Make sure you are staying well-hydrated with water you can alternate Pedialyte as well.  If needed you can take Tylenol for abdominal pain. I have sent Doxylamine for nausea, take as needed.

## 2023-12-16 NOTE — ED Provider Notes (Signed)
EMERGENCY DEPARTMENT AT [facility] Provider Note   CSN: [id number] Arrival date & time: 12/16/23  1030     History  Chief Complaint  Patient presents with   Nausea   Emesis   Abdominal Pain    Ruth Daniels is a 27 y.o. female patient with past medical history of PID, eczema, asthma, endometriosis presenting to emergency room with known pregnancy.  Patient's last menstrual period was approximately 8 weeks ago.  Patient was seen here with positive pregnancy test and has followed up with OB/GYN.  Patient reports that she "thinks she might be pregnant".  Reports for the past 8 weeks she has had some nausea worse after eating and worse in the morning.  Abdominal cramping that is mild and intermittent, worse in the left lower quadrant.  She also reports that she has had intermittent headaches that feels like a tight headband is wrapping around her forehead.  Denies current headache.  Patient reports she has been monitoring her blood pressure at home and has not noted any elevated readings.  Denies any vaginal discharge, vaginal itching or vaginal bleeding.  Patient has had 3 prior pregnancies.   Emesis Associated symptoms: abdominal pain   Abdominal Pain Associated symptoms: vomiting        Home Medications Prior to Admission medications   Medication Sig Start Date End Date Taking? Authorizing Provider  acetaminophen (TYLENOL) 500 MG tablet Take 500 mg by mouth every 6 (six) hours as needed for mild pain or moderate pain. Patient not taking: Reported on 12/04/2023    [provider]  albuterol (PROAIR HFA) 108 (90 Base) MCG/ACT inhaler Inhale 1-2 puffs into the lungs every 6 (six) hours as needed for wheezing or shortness of breath. Patient not taking: Reported on 12/04/2023    [provider]  albuterol (PROVENTIL) (5 MG/ML) 0.5% nebulizer solution Take 0.5 mLs (2.5 mg total) by nebulization every 6 (six) hours as needed for wheezing or  shortness of breath. Patient not taking: Reported on 12/04/2023 08/08/18   Michela Pitcher A, PA-C  Doxylamine-Pyridoxine (DICLEGIS) 10-10 MG TBEC 2 tabs q hs, if sx persist add 1 tab q am on day 3, if sx persist add 1 tab q afternoon on day 4 12/04/23   Cheral Marker, CNM  EPINEPHrine 0.3 mg/0.3 mL IJ SOAJ injection Inject 0.3 mg into the muscle as needed for anaphylaxis. Patient not taking: Reported on 12/04/2023 06/03/23   Benjiman Core, MD  NIFEdipine (PROCARDIA-XL/NIFEDICAL-XL) 30 MG 24 hr tablet Take 1 tablet (30 mg total) by mouth daily. 12/04/23   Cheral Marker, CNM  valACYclovir (VALTREX) 500 MG tablet Take 500 mg by mouth as needed.    [provider]      Allergies    Bee venom, Penicillins, Latex, and Mushroom extract complex (do not select)    Review of Systems   Review of Systems  Gastrointestinal:  Positive for abdominal pain and vomiting.    Physical Exam Updated Vital Signs BP (!) 106/92 (BP Location: Right Arm)   Pulse 84   Temp 98.3 F (36.8 C) (Oral)   Resp 17   Ht 5\' 1"  (1.549 m)   Wt 77.1 kg   LMP  (LMP Unknown)   SpO2 99%   BMI 32.12 kg/m  Physical Exam Vitals and nursing note reviewed.  Constitutional:      General: She is not in acute distress.    Appearance: She is not toxic-appearing.  HENT:  Head: Normocephalic and atraumatic.  Eyes:     General: No scleral icterus.    Conjunctiva/sclera: Conjunctivae normal.  Cardiovascular:     Rate and Rhythm: Normal rate and regular rhythm.     Pulses: Normal pulses.     Heart sounds: Normal heart sounds.  Pulmonary:     Effort: Pulmonary effort is normal. No respiratory distress.     Breath sounds: Normal breath sounds.  Abdominal:     General: Abdomen is flat. Bowel sounds are normal. There is no distension.     Palpations: Abdomen is soft. There is no mass.     Tenderness: There is no abdominal tenderness.     Comments: Patient has no tenderness to palpation on abdominal exam.   Abdomen is soft.   Skin:    General: Skin is warm and dry.     Capillary Refill: Capillary refill takes less than 2 seconds.     Findings: No lesion.  Neurological:     General: No focal deficit present.     Mental Status: She is alert and oriented to person, place, and time. Mental status is at baseline.     Cranial Nerves: No cranial nerve deficit.     Sensory: No sensory deficit.     Motor: No weakness.     Coordination: Coordination normal.     ED Results / Procedures / Treatments   Labs (all labs ordered are listed, but only abnormal results are displayed) Labs Reviewed  COMPREHENSIVE METABOLIC PANEL  LIPASE, BLOOD  CBC WITH DIFFERENTIAL/PLATELET  URINALYSIS, ROUTINE W REFLEX MICROSCOPIC  HCG, QUANTITATIVE, PREGNANCY    EKG None  Radiology US OB Comp < 14 Wks Result Date: 12/16/2023 CLINICAL DATA:  Abdominal pain. EXAM: OBSTETRIC <14 WK [place] AND TRANSVAGINAL OB US TECHNIQUE: Both transabdominal and transvaginal ultrasound examinations were performed for complete evaluation of the gestation as well as the maternal uterus, adnexal regions, and pelvic cul-de-sac. Transvaginal technique was performed to assess early pregnancy. COMPARISON:  None Available. FINDINGS: Intrauterine gestational sac: Single Yolk sac:  Visualized. Embryo:  Visualized. Cardiac Activity: Visualized. Heart Rate: 153 bpm MSD: 25.5 mm   7 w   4 d CRL:  10.9 mm   7 w   1 d                  [place] EDC: 08/02/2024. Subchorionic hemorrhage: Small subchorionic hemorrhage seen measuring 1.3 x 2.3 x 2.6 cm, occupying less than 25% of the circumference of the gestational sac. Maternal uterus/adnexae: Within normal limits. Right corpus luteal cyst noted. IMPRESSION: *Single live intrauterine embryo with estimated gestational age of [redacted] weeks 1 day, as per crown-rump length. *Small subchorionic hemorrhage occupying less than 25% of the circumference of the gestational sac. Electronically Signed   By: Jules Schick M.D.   On:  12/16/2023 13:33   US OB Transvaginal Result Date: 12/16/2023 CLINICAL DATA:  Abdominal pain. EXAM: OBSTETRIC <14 WK [place] AND TRANSVAGINAL OB US TECHNIQUE: Both transabdominal and transvaginal ultrasound examinations were performed for complete evaluation of the gestation as well as the maternal uterus, adnexal regions, and pelvic cul-de-sac. Transvaginal technique was performed to assess early pregnancy. COMPARISON:  None Available. FINDINGS: Intrauterine gestational sac: Single Yolk sac:  Visualized. Embryo:  Visualized. Cardiac Activity: Visualized. Heart Rate: 153 bpm MSD: 25.5 mm   7 w   4 d CRL:  10.9 mm   7 w   1 d  [place] EDC: 08/02/2024. Subchorionic hemorrhage: Small subchorionic hemorrhage seen measuring 1.3 x 2.3 x 2.6 cm, occupying less than 25% of the circumference of the gestational sac. Maternal uterus/adnexae: Within normal limits. Right corpus luteal cyst noted. IMPRESSION: *Single live intrauterine embryo with estimated gestational age of [redacted] weeks 1 day, as per crown-rump length. *Small subchorionic hemorrhage occupying less than 25% of the circumference of the gestational sac. Electronically Signed   By: Jules Schick M.D.   On: 12/16/2023 13:33    Procedures Procedures    Medications Ordered in ED Medications - No data to display  ED Course/ Medical Decision Making/ A&P                                 Medical Decision Making Amount and/or Complexity of Data Reviewed Labs: ordered. Radiology: ordered.  Risk Prescription drug management.   Pixie Casino 27 y.o. presented today for abd pain. Working DDx includes, but not limited to, gastroenteritis, colitis, SBO, appendicitis, cholecystitis, hepatobiliary pathology, gastritis, PUD, ACS, dissection, pancreatitis, nephrolithiasis, AAA, UTI, pyelonephritis, ectopic pregnancy, PID, ovarian torsion.  R/o DDx: These are considered less likely than current impression due to history of present illness, physical  exam, labs/imaging findings.  Review of prior external notes: Reviewed 12/04/2023 office visit as well as 11/22/2023 visit to emergency room with positive pregnancy test  Pmhx: History of pelvic inflammatory disease, previous C-section  Unique Tests and My Interpretation:  CBC without leukocytosis, hemoglobin is 12.1.  CMP without AKI, glucose 83.  Lipase 31.  Urine with 11-20 squamous cells appears to be contaminated will send for culture.  hCG 40,981 appears to be rising appropriately for [redacted] weeks gestation.  Imaging:  Pelvic ultrasound live single intrauterine pregnancy approximately 7 weeks 1 day.  Small subchorionic bleed.  Discussed findings with patient encouraged close follow-up.  Problem List / ED Course / Critical interventions / Medication management  Patient reported to emergency room with 8 weeks of abdominal cramping and nausea.  Patient reports she may be pregnant.  hCG confirms she has.  Ultrasound with live single uterine pregnancy.  Patient's symptoms are well-controlled.  Hemodynamically stable and well-appearing.  Patient will establish with women's health provider.  I encourage patient to call following ER visit to ensure resolution or improvement in symptoms.  All questions answered.  Patient stable for discharge.  Declined medication need. Sent refill for nausea medication to pharmacy  Patients vitals assessed. Upon arrival patient is hemodynamically stable.  I have reviewed the patients home medicines and have made adjustments as needed    Consult: None   Plan:  F/u w/ PCP in 2-3d to ensure resolution of sx.  Patient was given return precautions. Patient stable for discharge at this time.  Patient educated on current sx/dx and verbalized understanding of plan. Return to ER w/ new or worsening sx.          Final Clinical Impression(s) / ED Diagnoses Final diagnoses:  Less than [redacted] weeks gestation of pregnancy    Rx / DC Orders ED Discharge Orders      None         Smitty Knudsen, PA-C 12/16/23 1445    Gloris Manchester, MD 12/16/23 1622

## 2023-12-16 NOTE — ED Notes (Signed)
Patient gone to the restroom to try and give urine sample.

## 2023-12-17 LAB — URINE CULTURE

## 2023-12-26 ENCOUNTER — Other Ambulatory Visit: Payer: MEDICAID | Admitting: Radiology

## 2024-01-05 ENCOUNTER — Emergency Department (HOSPITAL_COMMUNITY): Payer: MEDICAID

## 2024-01-05 ENCOUNTER — Emergency Department (HOSPITAL_COMMUNITY)
Admission: EM | Admit: 2024-01-05 | Discharge: 2024-01-05 | Disposition: A | Discharge status: Home / Self Care | Payer: MEDICAID | Attending: Emergency Medicine | Admitting: Emergency Medicine

## 2024-01-05 ENCOUNTER — Other Ambulatory Visit: Payer: Self-pay

## 2024-01-05 ENCOUNTER — Encounter (HOSPITAL_COMMUNITY): Payer: Self-pay | Admitting: *Deleted

## 2024-01-05 DIAGNOSIS — J4520 Mild intermittent asthma, uncomplicated: Secondary | ICD-10-CM | POA: Insufficient documentation

## 2024-01-05 DIAGNOSIS — Z91040 Latex allergy status: Secondary | ICD-10-CM | POA: Diagnosis not present

## 2024-01-05 DIAGNOSIS — J069 Acute upper respiratory infection, unspecified: Secondary | ICD-10-CM | POA: Diagnosis not present

## 2024-01-05 DIAGNOSIS — Z20822 Contact with and (suspected) exposure to covid-19: Secondary | ICD-10-CM | POA: Insufficient documentation

## 2024-01-05 DIAGNOSIS — R0602 Shortness of breath: Secondary | ICD-10-CM | POA: Diagnosis present

## 2024-01-05 LAB — TROPONIN I (HIGH SENSITIVITY): Troponin I (High Sensitivity): <2 ng/L (ref <18)

## 2024-01-05 LAB — COMPREHENSIVE METABOLIC PANEL
ALT: 14 U/L (ref 0–44)
AST: 15 U/L (ref 15–41)
Albumin: 3.6 g/dL (ref 3.5–5.0)
Alkaline Phosphatase: 43 U/L (ref 38–126)
Anion gap: 7 (ref 5–15)
BUN: 8 mg/dL (ref 6–20)
CO2: 22 mmol/L (ref 22–32)
Calcium: 8.8 mg/dL — ABNORMAL LOW (ref 8.9–10.3)
Chloride: 104 mmol/L (ref 98–111)
Creatinine, Ser: 0.44 mg/dL (ref 0.44–1.00)
GFR, Estimated: >60 mL/min (ref >60)
Glucose, Bld: 81 mg/dL (ref 70–99)
Potassium: 3.7 mmol/L (ref 3.5–5.1)
Sodium: 133 mmol/L — ABNORMAL LOW (ref 135–145)
Total Bilirubin: 0.3 mg/dL (ref 0.0–1.2)
Total Protein: 7.2 g/dL (ref 6.5–8.1)

## 2024-01-05 LAB — CBC
HCT: 33.7 % — ABNORMAL LOW (ref 36.0–46.0)
Hemoglobin: 11.7 g/dL — ABNORMAL LOW (ref 12.0–15.0)
MCH: 31.5 pg (ref 26.0–34.0)
MCHC: 34.7 g/dL (ref 30.0–36.0)
MCV: 90.6 fL (ref 80.0–100.0)
Platelets: 296 10*3/uL (ref 150–400)
RBC: 3.72 MIL/uL — ABNORMAL LOW (ref 3.87–5.11)
RDW: 12.5 % (ref 11.5–15.5)
WBC: 6.9 10*3/uL (ref 4.0–10.5)
nRBC: 0 % (ref 0.0–0.2)

## 2024-01-05 LAB — RESP PANEL BY RT-PCR (RSV, FLU A&B, COVID)  RVPGX2
Influenza A by PCR: NEGATIVE
Influenza B by PCR: NEGATIVE
Resp Syncytial Virus by PCR: NEGATIVE
SARS Coronavirus 2 by RT PCR: NEGATIVE

## 2024-01-05 LAB — HCG, QUANTITATIVE, PREGNANCY: hCG, Beta Chain, Quant, S: 113980 m[IU]/mL — ABNORMAL HIGH (ref <5)

## 2024-01-05 MED ORDER — ALBUTEROL SULFATE HFA 108 (90 BASE) MCG/ACT IN AERS
2.0000 | INHALATION_SPRAY | RESPIRATORY_TRACT | Status: DC | PRN
  Filled 2024-01-05: qty 6.7

## 2024-01-05 NOTE — Discharge Instructions (Addendum)
 It was a pleasure caring for you today. Please follow up with your primary care provider. Seek emergency care if experiencing any new or worsening symptoms.

## 2024-01-05 NOTE — ED Provider Notes (Signed)
 [place] EMERGENCY DEPARTMENT AT [facility] Provider Note   CSN: [id number] Arrival date & time: 01/05/24  1107     History  Chief Complaint  Patient presents with   Shortness of Breath    Ruth Daniels is a 28 y.o. female with PMHx asthma, endometriosis who presents to ED concerned for rhinorrhea, congestion, and coughing x3 days. Patient also stating that SOB and chest pain started last night. SOB is constant. Chest pain occurs with deep inspiration. Patient ran out of albuterol  inhaler.   Of note, patient also stating that she feels a vague tightness in her abdomen. Patient is concerned since she is [redacted] weeks pregnant. Denies vaginal bleeding. Endorses vaginal discharge which was worked-up by GYN last week and was not concerning for any specific infection. Denies dysuria, hematuria, diarrhea, hematochezia, vomiting, diarrhea. Shared with patient that we do not have US  here at [facility] at [facility] on Sundays. Patient stating that she is able to have close follow-up with outpatient GYN.   Shortness of Breath      Home Medications Prior to Admission medications   Medication Sig Start Date End Date Taking? Authorizing Provider  Doxylamine -Pyridoxine  (DICLEGIS ) 10-10 MG TBEC 2 tabs q hs, if sx persist add 1 tab q am on day 3, if sx persist add 1 tab q afternoon on day 4 Patient not taking: Reported on 01/05/2024 12/16/23   Barrett, Warren SAILOR, PA-C  NIFEdipine  (PROCARDIA -XL/NIFEDICAL-XL) 30 MG 24 hr tablet Take 1 tablet (30 mg total) by mouth daily. Patient not taking: Reported on 01/05/2024 12/04/23   Kizzie Suzen SAUNDERS, CNM      Allergies    Bee venom, Penicillins, Latex, and Mushroom extract complex (do not select)    Review of Systems   Review of Systems  Respiratory:  Positive for shortness of breath.     Physical Exam Updated Vital Signs BP 109/72   Pulse 77   Temp 99 F (37.2 C) (Oral)   Resp 17   Ht 5' (1.524 m)   Wt 79.4 kg   LMP  (LMP Unknown)   SpO2 100%    BMI 34.18 kg/m  Physical Exam Vitals and nursing note reviewed.  Constitutional:      General: She is not in acute distress.    Appearance: She is not ill-appearing, toxic-appearing or diaphoretic.  HENT:     Head: Normocephalic and atraumatic.     Mouth/Throat:     Mouth: Mucous membranes are moist.     Pharynx: No posterior oropharyngeal erythema.  Eyes:     General: No scleral icterus.       Right eye: No discharge.        Left eye: No discharge.     Conjunctiva/sclera: Conjunctivae normal.  Cardiovascular:     Rate and Rhythm: Normal rate and regular rhythm.     Pulses: Normal pulses.     Heart sounds: Normal heart sounds. No murmur heard. Pulmonary:     Effort: Pulmonary effort is normal. No respiratory distress.     Breath sounds: Normal breath sounds. No wheezing, rhonchi or rales.  Abdominal:     Tenderness: There is no abdominal tenderness.  Musculoskeletal:     Right lower leg: No edema.     Left lower leg: No edema.     Comments: No calf tenderness to palpation  Skin:    General: Skin is warm and dry.     Findings: No rash.  Neurological:     General: No focal  deficit present.     Mental Status: She is alert and oriented to person, place, and time. Mental status is at baseline.  Psychiatric:        Mood and Affect: Mood normal.        Behavior: Behavior normal.     ED Results / Procedures / Treatments   Labs (all labs ordered are listed, but only abnormal results are displayed) Labs Reviewed  COMPREHENSIVE METABOLIC PANEL - Abnormal; Notable for the following components:      Result Value   Sodium 133 (*)    Calcium 8.8 (*)    All other components within normal limits  CBC - Abnormal; Notable for the following components:   RBC 3.72 (*)    Hemoglobin 11.7 (*)    HCT 33.7 (*)    All other components within normal limits  HCG, QUANTITATIVE, PREGNANCY - Abnormal; Notable for the following components:   hCG, Beta Chain, Quant, S 113,980 (*)    All  other components within normal limits  RESP PANEL BY RT-PCR (RSV, FLU A&B, COVID)  RVPGX2  TROPONIN I (HIGH SENSITIVITY)    EKG EKG: Normal sinus rhythm  Radiology DG Chest 2 View Result Date: 01/05/2024 CLINICAL DATA:  Shortness of breath EXAM: CHEST - 2 VIEW COMPARISON:  X-ray 09/07/2023. FINDINGS: The heart size and mediastinal contours are within normal limits. No consolidation, pneumothorax or effusion. No edema. The visualized skeletal structures are unremarkable. Degenerative changes along the spine. IMPRESSION: No acute cardiopulmonary disease. Electronically Signed   By: Ranell Bring M.D.   On: 01/05/2024 15:23    Procedures Procedures    Medications Ordered in ED Medications  albuterol  (VENTOLIN  HFA) 108 (90 Base) MCG/ACT inhaler 2 puff (has no administration in time range)    ED Course/ Medical Decision Making/ A&P                                 Medical Decision Making Risk Prescription drug management.   This patient presents to the ED for concern of shortness of breath, this involves an extensive number of treatment options, and is a complaint that carries with it a high risk of complications and morbidity.  The differential diagnosis includes Anxiety, Anaphylaxis/Angioedema, Aspirated FB, Arrhythmia, CHF, Asthma, COPD, PNA, COVID/Flu/RSV, STEMI, Tamponade, TPNX, Sepsis   Co morbidities that complicate the patient evaluation  asthma, endometriosis, 11w pregnant   Additional history obtained:  PCP with [facility]   Problem List / ED Course / Critical interventions / Medication management  Patient presents to ED concerned for congestion, rhinorrhea, cough, SOB, chest pain.  Chest pain happens with deep inspirations.  Patient does have a history of asthma and ran out of her albuterol  inhaler.  Physical exam unremarkable.  Patient afebrile with stable vitals. I Ordered, and personally interpreted labs.  hCG elevated at 113,980.  Respiratory  panel negative.  CBC without leukocytosis.  There is mild anemia with hemoglobin at 11.7.  CMP reassuring.  Troponin within normal limits. The patient was maintained on a cardiac monitor.  I personally viewed and interpreted the cardiac monitored which showed an underlying rhythm of: Sinus rhythm I ordered imaging studies including chest xray to assess for process contributing to patient's symptoms. I independently visualized and interpreted imaging which showed no acute cardiopulmonary disease. I agree with the radiologist interpretation. Provided patient with a new albuterol  inhaler in the ED room.  Patient did not want  to take a puff of albuterol  inhaler at this time.  Offered patient breathing treatment which she declined.  Shared lab and imaging results with patient.  Recommended following up with PCP and GYN.  Patient verbalized understanding of plan. Staffed with Dr. Patsey I have reviewed the patients home medicines and have made adjustments as needed Patient afebrile with stable vitals be provided with return precautions.  Discharged in good condition.  DDx: These are considered less likely due to history of present illness and physical exam findings -Aspirated FB: no history of choking -Arrhythmia/STEMI: EKG reassuring -Tamponade: physical exam and chest xray reassuring -CHF: no physical exam findings -TPNX: Lungs clear to auscultation bilaterally -Sepsis: afebrile and other vital signs stable -PE: patient without tachycardia or hypoxia. No calf tenderness to palpation, no respiratory distress   Social Determinants of Health:  none          Final Clinical Impression(s) / ED Diagnoses Final diagnoses:  Mild intermittent asthma without complication  Viral upper respiratory tract infection    Rx / DC Orders ED Discharge Orders     None         Shanautica Forker F, PA-C 01/05/24 1705    Patsey Lot, MD 01/06/24 1525

## 2024-01-05 NOTE — ED Triage Notes (Signed)
 Pt BIB [facility] for SOB, hx of asthma and reported pt ran out of her inhaler.  Mid CP that started last night and at present. Sore throat x 3 days, reported sick contacts in the household with sore throat as well. Reported HR 91, 116/78, RA sats 100% CBG 109.  Reported pt is [redacted] weeks pregnant and no complications so far.

## 2024-01-06 LAB — PANORAMA PRENATAL TEST FULL PANEL:PANORAMA TEST PLUS 5 ADDITIONAL MICRODELETIONS: FETAL FRACTION: 4.5

## 2024-01-21 ENCOUNTER — Other Ambulatory Visit: Payer: Self-pay | Admitting: Obstetrics & Gynecology

## 2024-01-21 ENCOUNTER — Encounter: Payer: Self-pay | Admitting: Women's Health

## 2024-01-21 DIAGNOSIS — Z3682 Encounter for antenatal screening for nuchal translucency: Secondary | ICD-10-CM

## 2024-01-22 ENCOUNTER — Encounter: Payer: MEDICAID | Admitting: Women's Health

## 2024-01-22 ENCOUNTER — Other Ambulatory Visit: Payer: MEDICAID

## 2024-01-22 ENCOUNTER — Ambulatory Visit: Payer: MEDICAID | Admitting: *Deleted

## 2024-01-28 ENCOUNTER — Encounter (HOSPITAL_COMMUNITY): Payer: Self-pay | Admitting: *Deleted

## 2024-01-28 ENCOUNTER — Emergency Department (HOSPITAL_COMMUNITY)
Admission: EM | Admit: 2024-01-28 | Discharge: 2024-01-28 | Disposition: A | Discharge status: Home / Self Care | Payer: MEDICAID | Attending: Emergency Medicine | Admitting: Emergency Medicine

## 2024-01-28 ENCOUNTER — Other Ambulatory Visit: Payer: Self-pay

## 2024-01-28 ENCOUNTER — Emergency Department (HOSPITAL_COMMUNITY): Payer: MEDICAID

## 2024-01-28 DIAGNOSIS — O99512 Diseases of the respiratory system complicating pregnancy, second trimester: Secondary | ICD-10-CM | POA: Insufficient documentation

## 2024-01-28 DIAGNOSIS — Z20822 Contact with and (suspected) exposure to covid-19: Secondary | ICD-10-CM | POA: Insufficient documentation

## 2024-01-28 DIAGNOSIS — Z91040 Latex allergy status: Secondary | ICD-10-CM | POA: Insufficient documentation

## 2024-01-28 DIAGNOSIS — J069 Acute upper respiratory infection, unspecified: Secondary | ICD-10-CM | POA: Insufficient documentation

## 2024-01-28 LAB — RESP PANEL BY RT-PCR (RSV, FLU A&B, COVID)  RVPGX2
Influenza A by PCR: NEGATIVE
Influenza B by PCR: NEGATIVE
Resp Syncytial Virus by PCR: NEGATIVE
SARS Coronavirus 2 by RT PCR: NEGATIVE

## 2024-01-28 NOTE — ED Provider Notes (Signed)
[place] EMERGENCY DEPARTMENT AT [facility] Provider Note   CSN: [id number] Arrival date & time: 01/28/24  1013     History  Chief Complaint  Patient presents with   Cough    Ruth Daniels is a 28 y.o. female.  Patient complains of a cough and congestion.  Patient denies any fever or chills.  Patient complains of a mild sore throat.  Patient reports last time she coughs she saw a small streak of blood.  Patient also reports that she is having some lower abdominal discomfort.  Patient is [redacted] weeks pregnant.  She denies any burning with urination she has not had any vomiting or diarrhea.  The history is provided by the patient. No language interpreter was used.  Cough Cough characteristics:  Non-productive Sputum characteristics:  Nondescript      Home Medications Prior to Admission medications   Medication Sig Start Date End Date Taking? Authorizing Provider  Doxylamine-Pyridoxine (DICLEGIS) 10-10 MG TBEC 2 tabs q hs, if sx persist add 1 tab q am on day 3, if sx persist add 1 tab q afternoon on day 4 Patient not taking: Reported on 01/05/2024 12/16/23   Barrett, Horald Chestnut, PA-C  NIFEdipine (PROCARDIA-XL/NIFEDICAL-XL) 30 MG 24 hr tablet Take 1 tablet (30 mg total) by mouth daily. Patient not taking: Reported on 01/05/2024 12/04/23   Cheral Marker, CNM      Allergies    Bee venom, Penicillins, Latex, and Mushroom extract complex (do not select)    Review of Systems   Review of Systems  Respiratory:  Positive for cough.   All other systems reviewed and are negative.   Physical Exam Updated Vital Signs BP 109/69 (BP Location: Right Arm)   Pulse 91   Temp 98.1 F (36.7 C)   Resp 16   Ht 5' (1.524 m)   Wt 78.5 kg   LMP  (LMP Unknown)   SpO2 100%   BMI 33.79 kg/m  Physical Exam Vitals and nursing note reviewed.  Constitutional:      Appearance: She is well-developed.  HENT:     Head: Normocephalic.     Mouth/Throat:     Mouth: Mucous membranes  are moist.  Cardiovascular:     Rate and Rhythm: Normal rate.  Pulmonary:     Effort: Pulmonary effort is normal.  Abdominal:     General: Bowel sounds are normal. There is no distension.     Palpations: Abdomen is soft.  Musculoskeletal:        General: Normal range of motion.     Cervical back: Normal range of motion.  Skin:    General: Skin is warm.  Neurological:     General: No focal deficit present.     Mental Status: She is alert and oriented to person, place, and time.     ED Results / Procedures / Treatments   Labs (all labs ordered are listed, but only abnormal results are displayed) Labs Reviewed  RESP PANEL BY RT-PCR (RSV, FLU A&B, COVID)  RVPGX2    EKG None  Radiology US OB Limited Result Date: 01/28/2024 CLINICAL DATA:  Pregnancy.  Evaluate for viability. EXAM: LIMITED OBSTETRIC ULTRASOUND COMPARISON:  Ultrasound dated 12/30/2023. FINDINGS: Number of Fetuses: 1 Heart Rate:  145 bpm Movement: Detected Presentation: Variable Placental Location: Posterior Previa: No Amniotic Fluid (Subjective):  Within normal limits. BPD: 2.5 cm 14 w  2 d MATERNAL FINDINGS: Cervix:  Appears closed. Uterus/Adnexae: No abnormality visualized. IMPRESSION: Single live intrauterine pregnancy.  No acute findings. This exam is performed on an emergent basis and does not comprehensively evaluate fetal size, dating, or anatomy; follow-up complete OB US should be considered if further fetal assessment is warranted. Electronically Signed   By: Elgie Collard M.D.   On: 01/28/2024 14:47    Procedures Procedures    Medications Ordered in ED Medications - No data to display  ED Course/ Medical Decision Making/ A&P                                 Medical Decision Making Patient complains of some lower abdominal discomfort.  Patient reports she has had a cough and congestion.  She is [redacted] weeks pregnant  Amount and/or Complexity of Data Reviewed Labs: ordered. Decision-making details  documented in ED Course.    Details: Influenza COVID and RSV are negative Radiology: ordered and independent interpretation performed. Decision-making details documented in ED Course.    Details: OB ultrasound shows normal IUP.  Risk Risk Details: Patient at advise continued OB care symptomatic treatment return if any problems.           Final Clinical Impression(s) / ED Diagnoses Final diagnoses:  Viral URI with cough    Rx / DC Orders ED Discharge Orders     None         Osie Cheeks 01/28/24 1548    Cathren Laine, MD 01/28/24 [phone]

## 2024-01-28 NOTE — ED Triage Notes (Signed)
Pt with c/o cough that is productive and has noted a streak of blood + sore throat since Sunday.  Denies any fever or chills.  Pt is [redacted] weeks pregnant.  Cramping to abd sides and lower back.  Denies any vaginal bleeding.

## 2024-01-28 NOTE — Discharge Instructions (Signed)
Return if any problems.  Follow up with your OB/gyn

## 2024-06-23 ENCOUNTER — Emergency Department (HOSPITAL_COMMUNITY)
Admission: EM | Admit: 2024-06-23 | Discharge: 2024-06-23 | Disposition: A | Discharge status: Home / Self Care | Payer: MEDICAID | Attending: Emergency Medicine | Admitting: Emergency Medicine

## 2024-06-23 ENCOUNTER — Encounter (HOSPITAL_COMMUNITY): Payer: Self-pay | Admitting: Emergency Medicine

## 2024-06-23 ENCOUNTER — Emergency Department (HOSPITAL_COMMUNITY): Payer: MEDICAID

## 2024-06-23 ENCOUNTER — Other Ambulatory Visit: Payer: Self-pay

## 2024-06-23 DIAGNOSIS — T59811A Toxic effect of smoke, accidental (unintentional), initial encounter: Secondary | ICD-10-CM | POA: Diagnosis not present

## 2024-06-23 DIAGNOSIS — Z3A34 34 weeks gestation of pregnancy: Secondary | ICD-10-CM | POA: Diagnosis not present

## 2024-06-23 DIAGNOSIS — O26893 Other specified pregnancy related conditions, third trimester: Secondary | ICD-10-CM | POA: Diagnosis not present

## 2024-06-23 DIAGNOSIS — O9A213 Injury, poisoning and certain other consequences of external causes complicating pregnancy, third trimester: Secondary | ICD-10-CM | POA: Insufficient documentation

## 2024-06-23 DIAGNOSIS — Z91040 Latex allergy status: Secondary | ICD-10-CM | POA: Diagnosis not present

## 2024-06-23 LAB — COOXEMETRY PANEL
Carboxyhemoglobin: 2.1 % — ABNORMAL HIGH (ref 0.5–1.5)
Methemoglobin: <0.7 % (ref 0.0–1.5)
O2 Saturation: 93.1 %
Total hemoglobin: 9.9 g/dL — ABNORMAL LOW (ref 12.0–16.0)
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
## 2024-06-23 NOTE — Discharge Instructions (Signed)
 Please follow-up closely with your primary care doctor on an outpatient basis.  Return to emergency department immediately for any new or worsening symptoms.
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
## 2024-11-18 ENCOUNTER — Encounter (HOSPITAL_COMMUNITY): Payer: Self-pay | Admitting: *Deleted

## 2024-11-18 ENCOUNTER — Emergency Department (HOSPITAL_COMMUNITY)
Admission: EM | Admit: 2024-11-18 | Discharge: 2024-11-18 | Disposition: A | Discharge status: Home / Self Care | Payer: MEDICAID | Attending: Emergency Medicine | Admitting: Emergency Medicine

## 2024-11-18 ENCOUNTER — Emergency Department (HOSPITAL_COMMUNITY): Payer: MEDICAID

## 2024-11-18 ENCOUNTER — Other Ambulatory Visit: Payer: Self-pay

## 2024-11-18 DIAGNOSIS — S63632A Sprain of interphalangeal joint of right middle finger, initial encounter: Secondary | ICD-10-CM | POA: Insufficient documentation

## 2024-11-18 DIAGNOSIS — S6991XA Unspecified injury of right wrist, hand and finger(s), initial encounter: Secondary | ICD-10-CM | POA: Diagnosis present

## 2024-11-18 DIAGNOSIS — Z91040 Latex allergy status: Secondary | ICD-10-CM | POA: Diagnosis not present

## 2024-11-18 DIAGNOSIS — Z113 Encounter for screening for infections with a predominantly sexual mode of transmission: Secondary | ICD-10-CM | POA: Diagnosis not present

## 2024-11-18 DIAGNOSIS — W228XXA Striking against or struck by other objects, initial encounter: Secondary | ICD-10-CM | POA: Insufficient documentation

## 2024-11-18 LAB — WET PREP, GENITAL
Sperm: NONE SEEN
Trich, Wet Prep: NONE SEEN
WBC, Wet Prep HPF POC: <10 (ref <10)
Yeast Wet Prep HPF POC: NONE SEEN

## 2024-11-18 LAB — POC URINE PREG, ED: Preg Test, Ur: NEGATIVE

## 2024-11-18 MED ORDER — IBUPROFEN 400 MG PO TABS
600.0000 mg | ORAL_TABLET | Freq: Once | ORAL | Status: AC
  Administered 2024-11-18: 600 mg via ORAL
  Filled 2024-11-18: qty 2

## 2024-11-18 NOTE — Discharge Instructions (Addendum)
 Seen today for swelling in your right finger.  Your x-ray was normal, you have a sprain, keep the fingers buddy taped, limit the use of your right hand and follow-up with PCP.  If you are not getting better over the next week you should follow-up with orthopedics, sometimes a small nondisplaced fracture does not show up on initial x-rays.  Come back to the ER if you have redness, increased swelling and pain, fever or other worrisome symptoms.  We are pending your test for gonorrhea and chlamydia, follow-up on MyChart and follow-up with your PCP for this.  You also follow-up with them for further STI testing for HIV and syphilis.  As discussed, your wet prep did show clue cells which can be a sign of overgrowth of abnormal bacteria, but since you are not having any vaginal itching or discharge or odor we do not need to treat this.  Follow-up with your doctor.
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
## 2024-11-18 NOTE — ED Provider Notes (Signed)
 [place] [facility]   CSN: [id number] Arrival date & time: 11/18/24  1219     Patient presents with: Finger Injury   Ruth Daniels is a 28 y.o. female.  To ER today for swelling to her right middle finger.  She does swelling today, she states she had hit her door with her hand and noticed a small blood blister on the ulnar side of the pad of the finger but did not recall having immediate pain.  She is having some swelling and pain with flexing the PIP joint and wanted to have x-rays.  Denies fever or chills, she states range of motion is unchanged but is slightly painful.    Patient also requesting STI testing.  She states she is a relationship and just wanted to have testing.  She denies any vaginal discharge or abdominal pain.  She states she is sexually active with 1 female partner only but states he has had prior partners and wanted screening testing.  She does not want testing at this time for HIV or syphilis, and states she will follow-up with a primary care doctor for this   HPI     Prior to Admission medications   Medication Sig Start Date End Date Taking? Authorizing Provider  Doxylamine -Pyridoxine  (DICLEGIS ) 10-10 MG TBEC 2 tabs q hs, if sx persist add 1 tab q am on day 3, if sx persist add 1 tab q afternoon on day 4 Patient not taking: Reported on 01/05/2024 12/16/23   Barrett, Warren SAILOR, PA-C  metroNIDAZOLE  (FLAGYL ) 500 MG tablet Take 500 mg by mouth 2 (two) times daily. 06/15/24   [provider]  MICONAZOLE 7 2 % vaginal cream Place 1 Applicatorful vaginally 2 (two) times daily. 06/15/24   [provider]  NIFEdipine  (PROCARDIA -XL/NIFEDICAL-XL) 30 MG 24 hr tablet Take 1 tablet (30 mg total) by mouth daily. Patient not taking: Reported on 01/05/2024 12/04/23   Kizzie Suzen SAUNDERS, CNM    Allergies: Bee venom, Penicillins, Latex, and Mushroom extract complex (obsolete)    Review of Systems  Updated Vital  Signs BP (!) 135/90   Pulse 63   Temp (!) 97 F (36.1 C)   Resp 16   Ht 5' (1.524 m)   Wt 78.9 kg   LMP 09/30/2024   SpO2 100%   BMI 33.98 kg/m   Physical Exam Vitals and nursing note reviewed.  Constitutional:      General: She is not in acute distress.    Appearance: She is well-developed.  HENT:     Head: Normocephalic and atraumatic.  Eyes:     Conjunctiva/sclera: Conjunctivae normal.  Cardiovascular:     Rate and Rhythm: Normal rate and regular rhythm.     Heart sounds: No murmur heard. Pulmonary:     Effort: Pulmonary effort is normal. No respiratory distress.     Breath sounds: Normal breath sounds.  Abdominal:     Palpations: Abdomen is soft.     Tenderness: There is no abdominal tenderness.  Musculoskeletal:        General: No swelling.     Cervical back: Neck supple.     Comments: Mild swelling about the PIP joint of the right middle finger.  Patient can fully flex and extend without difficulty but states she does have some soreness of the PIP joint on flexion.  No redness or warmth, no fevers.  Skin:    General: Skin is warm and dry.  Capillary Refill: Capillary refill takes less than 2 seconds.     Comments: Accidentally 2 mm circular blood-filled vesicle to the middle finger on the palmar aspect  Neurological:     General: No focal deficit present.     Mental Status: She is alert and oriented to person, place, and time.  Psychiatric:        Mood and Affect: Mood normal.     (all labs ordered are listed, but only abnormal results are displayed) Labs Reviewed  WET PREP, GENITAL - Abnormal; Notable for the following components:      Result Value   Clue Cells Wet Prep HPF POC PRESENT (*)    All other components within normal limits  POC URINE PREG, ED  GC/CHLAMYDIA PROBE AMP ([place]) NOT AT [facility]    EKG: None  Radiology: DG Finger Middle Right Result Date: 11/18/2024 CLINICAL DATA:  Trauma to the right middle finger. EXAM: RIGHT MIDDLE  FINGER 2+V COMPARISON:  None Available. FINDINGS: No acute fracture or dislocation. The bones are well mineralized. No arthritic changes. Mild soft tissue swelling. No radiopaque foreign object or soft tissue gas. IMPRESSION: No acute fracture or dislocation. Electronically Signed   By: Vanetta Chou M.D.   On: 11/18/2024 13:40     Procedures   Medications Ordered in the ED  ibuprofen  (ADVIL ) tablet 600 mg (600 mg Oral Given 11/18/24 1505)                                    Medical Decision Making Differential diagnose includes but limited to fracture, sprain, strain, contusion, dislocation, flexor tenosynovitis, cellulitis, other  ED course: Patient presents ER for right finger pain, does not recall a specific injury but states she does recall hitting her door with her hand and thinks this could have been the cause.  She has normal range of motion, no signs of infection.  X-rays were ordered.  I viewed and interpreted the x-ray of the right middle finger.  No fracture or dislocation per my interpretation, I agree with radiology read. Patient does not have signs or symptoms of flexor tendinosis.  Will plan on buddy taping and using over-the-counter medicine as needed for pain.  Advise follow-up with PCP, hand if not improving.  Patient also requesting STI testing though has no symptoms and does not want empiric treatment.  She also declined testing for HIV and syphilis which were offered.  Advise follow-up with PCP for more complete testing. Preg is negative. Wet prep had clue cells, cussed with patient that this can be indicator of BV but she denies any abnormal vaginal discharge, no foul odor, no vaginal itching or irritation.  Time she does not want antibiotic treatment for this.  She is advised on close outpatient follow-up and strict return precautions.  Amount and/or Complexity of Data Reviewed Labs: ordered. Radiology: ordered.  Risk Prescription drug management.         Final diagnoses:  Sprain of interphalangeal joint of right middle finger, initial encounter  Encounter for screening examination for sexually transmitted infection    ED Discharge Orders     None          Suellen Sherran DELENA DEVONNA 11/18/24 1547    Cottie Donnice PARAS, MD 11/18/24 1742

## 2024-11-19 LAB — GC/CHLAMYDIA PROBE AMP (~~LOC~~) NOT AT ARMC
Chlamydia: NEGATIVE
Comment: NEGATIVE
Comment: NORMAL
Neisseria Gonorrhea: NEGATIVE
